# Patient Record
Sex: Female | Born: 1970
Health system: Southern US, Community
[De-identification: ages and names within clinical notes are randomized; demographics above are authoritative.]

## PROBLEM LIST (undated history)

## (undated) DIAGNOSIS — E119 Type 2 diabetes mellitus without complications: Secondary | ICD-10-CM

## (undated) DIAGNOSIS — I10 Essential (primary) hypertension: Secondary | ICD-10-CM

## (undated) DIAGNOSIS — Z8719 Personal history of other diseases of the digestive system: Secondary | ICD-10-CM

## (undated) DIAGNOSIS — D500 Iron deficiency anemia secondary to blood loss (chronic): Secondary | ICD-10-CM

## (undated) HISTORY — PX: CHOLECYSTECTOMY: SHX55

## (undated) HISTORY — PX: TUBAL LIGATION: SHX77

---

## 1998-01-08 ENCOUNTER — Emergency Department (HOSPITAL_COMMUNITY): Admission: EM | Admit: 1998-01-08 | Discharge: 1998-01-08 | Payer: Self-pay | Admitting: Emergency Medicine

## 1998-02-03 ENCOUNTER — Encounter: Payer: Self-pay | Admitting: Emergency Medicine

## 1998-02-03 ENCOUNTER — Emergency Department (HOSPITAL_COMMUNITY): Admission: EM | Admit: 1998-02-03 | Discharge: 1998-02-03 | Payer: Self-pay | Admitting: Emergency Medicine

## 1998-02-07 ENCOUNTER — Emergency Department (HOSPITAL_COMMUNITY): Admission: EM | Admit: 1998-02-07 | Discharge: 1998-02-08 | Payer: Self-pay | Admitting: Emergency Medicine

## 1998-02-08 ENCOUNTER — Encounter: Payer: Self-pay | Admitting: Emergency Medicine

## 1998-02-08 ENCOUNTER — Ambulatory Visit (HOSPITAL_COMMUNITY): Admission: RE | Admit: 1998-02-08 | Discharge: 1998-02-08 | Payer: Self-pay | Admitting: Emergency Medicine

## 1999-04-05 ENCOUNTER — Other Ambulatory Visit: Admission: RE | Admit: 1999-04-05 | Discharge: 1999-04-05 | Payer: Self-pay | Admitting: Internal Medicine

## 2002-04-12 ENCOUNTER — Encounter: Payer: Self-pay | Admitting: Emergency Medicine

## 2002-04-12 ENCOUNTER — Emergency Department (HOSPITAL_COMMUNITY): Admission: EM | Admit: 2002-04-12 | Discharge: 2002-04-12 | Payer: Self-pay | Admitting: Emergency Medicine

## 2002-04-26 ENCOUNTER — Encounter: Admission: RE | Admit: 2002-04-26 | Discharge: 2002-05-19 | Payer: Self-pay | Admitting: Orthopaedic Surgery

## 2002-08-03 ENCOUNTER — Emergency Department (HOSPITAL_COMMUNITY): Admission: EM | Admit: 2002-08-03 | Discharge: 2002-08-03 | Payer: Self-pay | Admitting: Emergency Medicine

## 2002-08-07 ENCOUNTER — Encounter: Payer: Self-pay | Admitting: Emergency Medicine

## 2002-08-07 ENCOUNTER — Emergency Department (HOSPITAL_COMMUNITY): Admission: EM | Admit: 2002-08-07 | Discharge: 2002-08-07 | Payer: Self-pay | Admitting: Emergency Medicine

## 2002-08-09 ENCOUNTER — Emergency Department (HOSPITAL_COMMUNITY): Admission: EM | Admit: 2002-08-09 | Discharge: 2002-08-09 | Payer: Self-pay | Admitting: Emergency Medicine

## 2003-01-20 ENCOUNTER — Emergency Department (HOSPITAL_COMMUNITY): Admission: EM | Admit: 2003-01-20 | Discharge: 2003-01-20 | Payer: Self-pay | Admitting: Emergency Medicine

## 2003-01-28 ENCOUNTER — Emergency Department (HOSPITAL_COMMUNITY): Admission: EM | Admit: 2003-01-28 | Discharge: 2003-01-28 | Payer: Self-pay | Admitting: Emergency Medicine

## 2004-08-09 ENCOUNTER — Emergency Department (HOSPITAL_COMMUNITY): Admission: EM | Admit: 2004-08-09 | Discharge: 2004-08-09 | Payer: Self-pay | Admitting: Emergency Medicine

## 2004-09-12 ENCOUNTER — Emergency Department (HOSPITAL_COMMUNITY): Admission: EM | Admit: 2004-09-12 | Discharge: 2004-09-12 | Payer: Self-pay | Admitting: Emergency Medicine

## 2004-12-29 ENCOUNTER — Emergency Department (HOSPITAL_COMMUNITY): Admission: EM | Admit: 2004-12-29 | Discharge: 2004-12-29 | Payer: Self-pay | Admitting: Emergency Medicine

## 2005-01-09 ENCOUNTER — Ambulatory Visit: Payer: Self-pay | Admitting: Family Medicine

## 2005-01-14 ENCOUNTER — Ambulatory Visit: Payer: Self-pay | Admitting: *Deleted

## 2005-03-06 ENCOUNTER — Ambulatory Visit: Payer: Self-pay | Admitting: Family Medicine

## 2005-06-12 ENCOUNTER — Ambulatory Visit: Payer: Self-pay | Admitting: Family Medicine

## 2005-06-26 ENCOUNTER — Ambulatory Visit: Payer: Self-pay | Admitting: Family Medicine

## 2005-07-06 ENCOUNTER — Emergency Department (HOSPITAL_COMMUNITY): Admission: EM | Admit: 2005-07-06 | Discharge: 2005-07-06 | Payer: Self-pay | Admitting: Emergency Medicine

## 2006-02-07 ENCOUNTER — Emergency Department (HOSPITAL_COMMUNITY): Admission: EM | Admit: 2006-02-07 | Discharge: 2006-02-07 | Payer: Self-pay | Admitting: Emergency Medicine

## 2006-02-09 ENCOUNTER — Emergency Department (HOSPITAL_COMMUNITY): Admission: EM | Admit: 2006-02-09 | Discharge: 2006-02-09 | Payer: Self-pay | Admitting: Emergency Medicine

## 2006-03-27 ENCOUNTER — Emergency Department (HOSPITAL_COMMUNITY): Admission: EM | Admit: 2006-03-27 | Discharge: 2006-03-27 | Payer: Self-pay | Admitting: Family Medicine

## 2006-08-03 ENCOUNTER — Emergency Department (HOSPITAL_COMMUNITY): Admission: EM | Admit: 2006-08-03 | Discharge: 2006-08-03 | Payer: Self-pay | Admitting: Emergency Medicine

## 2006-09-06 ENCOUNTER — Emergency Department (HOSPITAL_COMMUNITY): Admission: EM | Admit: 2006-09-06 | Discharge: 2006-09-06 | Payer: Self-pay | Admitting: Emergency Medicine

## 2006-09-07 ENCOUNTER — Emergency Department (HOSPITAL_COMMUNITY): Admission: EM | Admit: 2006-09-07 | Discharge: 2006-09-07 | Payer: Self-pay | Admitting: Emergency Medicine

## 2006-12-16 ENCOUNTER — Encounter (INDEPENDENT_AMBULATORY_CARE_PROVIDER_SITE_OTHER): Payer: Self-pay | Admitting: Family Medicine

## 2007-01-18 ENCOUNTER — Encounter (INDEPENDENT_AMBULATORY_CARE_PROVIDER_SITE_OTHER): Payer: Self-pay | Admitting: Emergency Medicine

## 2007-01-18 ENCOUNTER — Emergency Department (HOSPITAL_COMMUNITY): Admission: EM | Admit: 2007-01-18 | Discharge: 2007-01-18 | Payer: Self-pay | Admitting: Emergency Medicine

## 2007-07-18 ENCOUNTER — Emergency Department (HOSPITAL_COMMUNITY): Admission: EM | Admit: 2007-07-18 | Discharge: 2007-07-18 | Payer: Self-pay | Admitting: Family Medicine

## 2008-10-17 ENCOUNTER — Emergency Department (HOSPITAL_COMMUNITY): Admission: EM | Admit: 2008-10-17 | Discharge: 2008-10-17 | Payer: Self-pay | Admitting: Emergency Medicine

## 2009-11-01 ENCOUNTER — Emergency Department (HOSPITAL_COMMUNITY): Admission: EM | Admit: 2009-11-01 | Discharge: 2009-11-01 | Payer: Self-pay | Admitting: Emergency Medicine

## 2010-04-12 ENCOUNTER — Inpatient Hospital Stay (INDEPENDENT_AMBULATORY_CARE_PROVIDER_SITE_OTHER)
Admission: RE | Admit: 2010-04-12 | Discharge: 2010-04-12 | Disposition: A | Discharge status: Home / Self Care | Payer: Self-pay | Source: Ambulatory Visit | Attending: Family Medicine | Admitting: Family Medicine

## 2010-04-12 DIAGNOSIS — J019 Acute sinusitis, unspecified: Secondary | ICD-10-CM

## 2010-12-04 LAB — WET PREP, GENITAL
Clue Cells Wet Prep HPF POC: NONE SEEN
Trich, Wet Prep: NONE SEEN
Yeast Wet Prep HPF POC: NONE SEEN

## 2010-12-04 LAB — DIFFERENTIAL
Basophils Absolute: 0.2 — ABNORMAL HIGH
Eosinophils Absolute: 0.1 — ABNORMAL LOW
Lymphocytes Relative: 22
Lymphs Abs: 1.8
Monocytes Absolute: 0.7
Monocytes Relative: 8

## 2010-12-04 LAB — BASIC METABOLIC PANEL
CO2: 24
Creatinine, Ser: 0.84
GFR calc Af Amer: >60
Glucose, Bld: 144 — ABNORMAL HIGH
Potassium: 3.4 — ABNORMAL LOW
Sodium: 137

## 2010-12-04 LAB — URINALYSIS, ROUTINE W REFLEX MICROSCOPIC
Glucose, UA: NEGATIVE
Hgb urine dipstick: NEGATIVE
Leukocytes, UA: NEGATIVE
Nitrite: NEGATIVE
Protein, ur: 30 — AB
Specific Gravity, Urine: 1.035 — ABNORMAL HIGH

## 2010-12-04 LAB — URINE MICROSCOPIC-ADD ON

## 2010-12-04 LAB — CBC
Hemoglobin: 9.7 — ABNORMAL LOW
MCV: 58.4 — ABNORMAL LOW
Platelets: 469 — ABNORMAL HIGH
RBC: 5.46 — ABNORMAL HIGH
RDW: 21.5 — ABNORMAL HIGH
WBC: 8.2

## 2010-12-04 LAB — GC/CHLAMYDIA PROBE AMP, GENITAL
Chlamydia, DNA Probe: NEGATIVE
GC Probe Amp, Genital: NEGATIVE

## 2011-12-17 ENCOUNTER — Encounter (HOSPITAL_COMMUNITY): Payer: Self-pay | Admitting: Emergency Medicine

## 2011-12-17 ENCOUNTER — Emergency Department (INDEPENDENT_AMBULATORY_CARE_PROVIDER_SITE_OTHER)
Admission: EM | Admit: 2011-12-17 | Discharge: 2011-12-17 | Disposition: A | Discharge status: Home / Self Care | Payer: Self-pay | Source: Home / Self Care

## 2011-12-17 DIAGNOSIS — J019 Acute sinusitis, unspecified: Secondary | ICD-10-CM

## 2011-12-17 HISTORY — DX: Essential (primary) hypertension: I10

## 2011-12-17 MED ORDER — FLUTICASONE PROPIONATE 50 MCG/ACT NA SUSP: 2.0000 | Freq: Every day | NASAL | Status: DC

## 2011-12-17 NOTE — ED Provider Notes (Signed)
History     CSN: [id number]  Arrival date & time 12/17/11  1203   None     Chief Complaint  Patient presents with  . Otalgia    (Consider location/radiation/quality/duration/timing/severity/associated sxs/prior treatment) HPI Comments: 41 year old morbidly obese female developed URI symptoms approximately 48 hours ago. She states her ears hurt she has pressure in her ears and her bases bilaterally. She has PND but no fever, chills or sore throat. She is taking an OTC meds for sinuses. Denies shortness of breath or cough. She is tearful and pouting her lower lip.  Patient is a 41 y.o. female presenting with ear pain.  Otalgia Associated symptoms include rhinorrhea. Pertinent negatives include no neck pain and no rash.    Past Medical History  Diagnosis Date  . Hypertension     Past Surgical History  Procedure Date  . Cholecystectomy   . Cesarean section     No family history on file.  History  Substance Use Topics  . Smoking status: Never Smoker   . Smokeless tobacco: Not on file  . Alcohol Use: No    OB History    Grav Para Term Preterm Abortions TAB SAB Ect Mult Living                  Review of Systems  Constitutional: Negative for fever, chills, activity change, appetite change and fatigue.  HENT: Positive for ear pain, congestion, rhinorrhea, postnasal drip and sinus pressure. Negative for facial swelling, neck pain and neck stiffness.   Eyes: Negative.   Respiratory: Negative.   Cardiovascular: Negative.   Gastrointestinal: Negative.   Skin: Negative for pallor and rash.  Neurological: Negative.     Allergies  Review of patient's allergies indicates no known allergies.  Home Medications   Current Outpatient Rx  Name Route Sig Dispense Refill  . LISINOPRIL PO Oral Take by mouth.    Marland Kitchen OVER THE COUNTER MEDICATION  "dollar tree sinus medicine"    . FLUTICASONE PROPIONATE 50 MCG/ACT NA SUSP Nasal Place 2 sprays into the nose daily. 1 g 1    BP  129/78  Pulse 98  Temp 99 F (37.2 C) (Oral)  Resp 24  SpO2 99%  Physical Exam  Nursing note and vitals reviewed. Constitutional: She is oriented to person, place, and time. She appears well-developed and well-nourished.       Morbidly obese  HENT:  Right Ear: External ear normal.  Left Ear: External ear normal.  Mouth/Throat: Oropharynx is clear and moist. No oropharyngeal exudate.       The lightest touch over the paranasal sinuses and frontal  sinuses produces pain and withdrawal. The oropharynx is clear without erythema or exudates but there is small amount of clear PND. Bilateral TMs are normal. No erythema, retraction, or effusions.  Eyes: Conjunctivae normal and EOM are normal.  Neck: Normal range of motion. Neck supple.  Cardiovascular: Normal rate, regular rhythm and normal heart sounds.   Pulmonary/Chest: Effort normal and breath sounds normal. No respiratory distress. She has no wheezes.  Lymphadenopathy:    She has no cervical adenopathy.  Neurological: She is alert and oriented to person, place, and time. No cranial nerve deficit.  Skin: Skin is warm and dry.  Psychiatric: She has a normal mood and affect.    ED Course  Procedures (including critical care time)  Labs Reviewed - No data to display No results found.   1. Acute rhinosinusitis       MDM  Degrees  majority of these acute symptoms from rhinosinusitis are viral Sudafed 10 mg one by mouth every 4 hours when necessary congestion Normal saline nasal spray use frequently in both nostrils Fluticasone nasal spray as directed Neo-Synephrine nasal spray every 4 hours as needed for nasal congestion. Do not use more than 3 days. This is the short acting nasal spray         Hayden Rasmussen, NP 12/17/11 1527

## 2011-12-17 NOTE — ED Notes (Signed)
Facial pain and pressure, bilateral ear pain. Pain onset 2 days ag

## 2011-12-18 NOTE — ED Provider Notes (Signed)
Medical screening examination/treatment/procedure(s) were performed by resident physician or non-physician practitioner and as supervising physician I was immediately available for consultation/collaboration.   KINDL,JAMES DOUGLAS MD.    James D Kindl, MD 12/18/11 2102 

## 2013-06-23 ENCOUNTER — Other Ambulatory Visit: Payer: Self-pay | Admitting: Family Medicine

## 2013-06-23 ENCOUNTER — Other Ambulatory Visit (HOSPITAL_COMMUNITY)
Admission: RE | Admit: 2013-06-23 | Discharge: 2013-06-23 | Disposition: A | Discharge status: Home / Self Care | Payer: BC Managed Care – PPO | Source: Ambulatory Visit | Attending: Family Medicine | Admitting: Family Medicine

## 2013-06-23 DIAGNOSIS — Z124 Encounter for screening for malignant neoplasm of cervix: Secondary | ICD-10-CM | POA: Insufficient documentation

## 2013-06-23 DIAGNOSIS — Z1151 Encounter for screening for human papillomavirus (HPV): Secondary | ICD-10-CM | POA: Insufficient documentation

## 2013-07-08 ENCOUNTER — Other Ambulatory Visit: Payer: Self-pay | Admitting: Family Medicine

## 2013-07-08 DIAGNOSIS — N921 Excessive and frequent menstruation with irregular cycle: Secondary | ICD-10-CM

## 2013-07-13 ENCOUNTER — Emergency Department (HOSPITAL_COMMUNITY)
Admission: EM | Admit: 2013-07-13 | Discharge: 2013-07-13 | Disposition: A | Discharge status: Home / Self Care | Payer: BC Managed Care – PPO | Attending: Emergency Medicine | Admitting: Emergency Medicine

## 2013-07-13 ENCOUNTER — Encounter (HOSPITAL_COMMUNITY): Payer: Self-pay | Admitting: Emergency Medicine

## 2013-07-13 DIAGNOSIS — D649 Anemia, unspecified: Secondary | ICD-10-CM

## 2013-07-13 DIAGNOSIS — J302 Other seasonal allergic rhinitis: Secondary | ICD-10-CM

## 2013-07-13 DIAGNOSIS — R531 Weakness: Secondary | ICD-10-CM

## 2013-07-13 DIAGNOSIS — J309 Allergic rhinitis, unspecified: Secondary | ICD-10-CM | POA: Insufficient documentation

## 2013-07-13 DIAGNOSIS — R42 Dizziness and giddiness: Secondary | ICD-10-CM

## 2013-07-13 DIAGNOSIS — I10 Essential (primary) hypertension: Secondary | ICD-10-CM | POA: Insufficient documentation

## 2013-07-13 DIAGNOSIS — IMO0002 Reserved for concepts with insufficient information to code with codable children: Secondary | ICD-10-CM | POA: Insufficient documentation

## 2013-07-13 DIAGNOSIS — Z79899 Other long term (current) drug therapy: Secondary | ICD-10-CM | POA: Insufficient documentation

## 2013-07-13 LAB — POC URINE PREG, ED: Preg Test, Ur: NEGATIVE

## 2013-07-13 LAB — CBC WITH DIFFERENTIAL/PLATELET
BASOS PCT: 1 % (ref 0–1)
Basophils Absolute: 0.1 10*3/uL (ref 0.0–0.1)
EOS ABS: 0.2 10*3/uL (ref 0.0–0.7)
Eosinophils Relative: 2 % (ref 0–5)
HEMATOCRIT: 32.8 % — AB (ref 36.0–46.0)
HEMOGLOBIN: 9.1 g/dL — AB (ref 12.0–15.0)
LYMPHS ABS: 3 10*3/uL (ref 0.7–4.0)
LYMPHS PCT: 36 % (ref 12–46)
MCH: 16.9 pg — ABNORMAL LOW (ref 26.0–34.0)
MCHC: 27.7 g/dL — AB (ref 30.0–36.0)
MCV: 60.7 fL — AB (ref 78.0–100.0)
MONO ABS: 0.9 10*3/uL (ref 0.1–1.0)
Monocytes Relative: 11 % (ref 3–12)
NEUTROS ABS: 4.1 10*3/uL (ref 1.7–7.7)
Neutrophils Relative %: 50 % (ref 43–77)
PLATELETS: 434 10*3/uL — AB (ref 150–400)
RBC: 5.4 MIL/uL — ABNORMAL HIGH (ref 3.87–5.11)
RDW: 24.4 % — ABNORMAL HIGH (ref 11.5–15.5)
WBC: 8.3 10*3/uL (ref 4.0–10.5)

## 2013-07-13 LAB — COMPREHENSIVE METABOLIC PANEL
ALK PHOS: 110 U/L (ref 39–117)
ALT: 14 U/L (ref 0–35)
AST: 20 U/L (ref 0–37)
Albumin: 3.5 g/dL (ref 3.5–5.2)
BUN: 14 mg/dL (ref 6–23)
CALCIUM: 9.2 mg/dL (ref 8.4–10.5)
CO2: 24 meq/L (ref 19–32)
Chloride: 101 mEq/L (ref 96–112)
Creatinine, Ser: 0.73 mg/dL (ref 0.50–1.10)
GFR calc Af Amer: >90 mL/min (ref >90)
GLUCOSE: 100 mg/dL — AB (ref 70–99)
POTASSIUM: 4.6 meq/L (ref 3.7–5.3)
Sodium: 138 mEq/L (ref 137–147)
Total Bilirubin: <0.2 mg/dL — ABNORMAL LOW (ref 0.3–1.2)
Total Protein: 8.4 g/dL — ABNORMAL HIGH (ref 6.0–8.3)

## 2013-07-13 LAB — URINALYSIS, ROUTINE W REFLEX MICROSCOPIC
BILIRUBIN URINE: NEGATIVE
GLUCOSE, UA: NEGATIVE mg/dL
KETONES UR: NEGATIVE mg/dL
LEUKOCYTES UA: NEGATIVE
NITRITE: NEGATIVE
PROTEIN: NEGATIVE mg/dL
Specific Gravity, Urine: 1.013 (ref 1.005–1.030)
Urobilinogen, UA: 0.2 mg/dL (ref 0.0–1.0)
pH: 5.5 (ref 5.0–8.0)

## 2013-07-13 LAB — URINE MICROSCOPIC-ADD ON

## 2013-07-13 LAB — I-STAT TROPONIN, ED: TROPONIN I, POC: 0 ng/mL (ref 0.00–0.08)

## 2013-07-13 MED ORDER — MECLIZINE HCL 25 MG PO TABS: 25.0000 mg | ORAL_TABLET | Freq: Once | ORAL | Status: DC

## 2013-07-13 MED ORDER — SODIUM CHLORIDE 0.9 % IV BOLUS (SEPSIS)
1000.0000 mL | Freq: Once | INTRAVENOUS | Status: AC
  Administered 2013-07-13: 1000 mL via INTRAVENOUS

## 2013-07-13 MED ORDER — DIAZEPAM 5 MG PO TABS
5.0000 mg | ORAL_TABLET | Freq: Once | ORAL | Status: AC
  Administered 2013-07-13: 5 mg via ORAL
  Filled 2013-07-13: qty 1

## 2013-07-13 MED ORDER — FLUTICASONE PROPIONATE 50 MCG/ACT NA SUSP: 1.0000 | Freq: Every day | NASAL | Status: DC

## 2013-07-13 NOTE — Discharge Instructions (Signed)
Rest at home, drink plenty of fluids, eat well balanced diet. Make sure to continue your iron supplements. Please let your primary care doctor know tomorrow of your new symptoms and follow up with them in the office as soon as able. Also please follow up with OB/GYN regarding your heavy cycles. Return if worsening.     Dizziness Dizziness is a common problem. It is a feeling of unsteadiness or lightheadedness. You may feel like you are about to faint. Dizziness can lead to injury if you stumble or fall. A person of any age group can suffer from dizziness, but dizziness is more common in older adults. CAUSES  Dizziness can be caused by many different things, including:  Middle ear problems.  Standing for too long.  Infections.  An allergic reaction.  Aging.  An emotional response to something, such as the sight of blood.  Side effects of medicines.  Fatigue.  Problems with circulation or blood pressure.  Excess use of alcohol, medicines, or illegal drug use.  Breathing too fast (hyperventilation).  An arrhythmia or problems with your heart rhythm.  Low red blood cell count (anemia).  Pregnancy.  Vomiting, diarrhea, fever, or other illnesses that cause dehydration.  Diseases or conditions such as Parkinson's disease, high blood pressure (hypertension), diabetes, and thyroid problems.  Exposure to extreme heat. DIAGNOSIS  To find the cause of your dizziness, your caregiver may do a physical exam, lab tests, radiologic imaging scans, or an electrocardiography test (ECG).  TREATMENT  Treatment of dizziness depends on the cause of your symptoms and can vary greatly. HOME CARE INSTRUCTIONS   Drink enough fluids to keep your urine clear or pale yellow. This is especially important in very hot weather. In the elderly, it is also important in cold weather.  If your dizziness is caused by medicines, take them exactly as directed. When taking blood pressure medicines, it is  especially important to get up slowly.  Rise slowly from chairs and steady yourself until you feel okay.  In the morning, first sit up on the side of the bed. When this seems okay, stand slowly while holding onto something until you know your balance is fine.  If you need to stand in one place for a long time, be sure to move your legs often. Tighten and relax the muscles in your legs while standing.  If dizziness continues to be a problem, have someone stay with you for a day or two. Do this until you feel you are well enough to stay alone. Have the person call your caregiver if he or she notices changes in you that are concerning.  Do not drive or use heavy machinery if you feel dizzy.  Do not drink alcohol. SEEK IMMEDIATE MEDICAL CARE IF:   Your dizziness or lightheadedness gets worse.  You feel nauseous or vomit.  You develop problems with talking, walking, weakness, or using your arms, hands, or legs.  You are not thinking clearly or you have difficulty forming sentences. It may take a friend or family member to determine if your thinking is normal.  You develop chest pain, abdominal pain, shortness of breath, or sweating.  Your vision changes.  You notice any bleeding.  You have side effects from medicine that seems to be getting worse rather than better. MAKE SURE YOU:   Understand these instructions.  Will watch your condition.  Will get help right away if you are not doing well or get worse. Document Released: 08/07/2000 Document Revised: 05/06/2011  Document Reviewed: 08/31/2010 [facility] Patient Information 2014 [place], [place].

## 2013-07-13 NOTE — ED Provider Notes (Signed)
CSN: [id number]     Arrival date & time 07/13/13  1743 History   First MD Initiated Contact with Patient 07/13/13 1802     Chief Complaint  Patient presents with  . Dizziness     (Consider location/radiation/quality/duration/timing/severity/associated sxs/prior Treatment) HPI Cynthia Vazquez is a 43 y.o. female who presents to ED with complaint of dizziness and weakness. Patient states that she woke up feeling well this morning, states went to work. States at work and began feeling dizzy and lightheaded. States that she felt like she couldn't feed. States has generalized weakness as well. Patient is currently on her menstrual cycle. States she has very heavy periods and passing large clots. States that this is not unusual for her. She reports having a full physical by her primary care doctor 2 weeks ago was told that she's anemic. She was started on iron supplements which she is currently taking and states that they were going to set up her for iron transfusion. Patient states that she started having menstrual cycle 5 days ago and still heavy bleeding. She denies any abdominal pain. She does report headache. She denies any chest pain or shortness of breath. She denies any fever or chills. She denies abdominal pain, nausea, vomiting, diarrhea. She states eating and drinking well. No history of the same.    Past Medical History  Diagnosis Date  . Hypertension    Past Surgical History  Procedure Laterality Date  . Cholecystectomy    . Cesarean section     No family history on file. History  Substance Use Topics  . Smoking status: Never Smoker   . Smokeless tobacco: Not on file  . Alcohol Use: No   OB History   Grav Para Term Preterm Abortions TAB SAB Ect Mult Living                 Review of Systems  Constitutional: Positive for fatigue. Negative for fever and chills.  HENT: Positive for congestion.   Respiratory: Negative for cough, chest tightness and shortness of breath.    Cardiovascular: Negative for chest pain, palpitations and leg swelling.  Gastrointestinal: Negative for nausea, vomiting, abdominal pain and diarrhea.  Genitourinary: Positive for vaginal bleeding. Negative for dysuria, flank pain, vaginal discharge, vaginal pain and pelvic pain.  Musculoskeletal: Negative for arthralgias, myalgias, neck pain and neck stiffness.  Skin: Negative for rash.  Neurological: Positive for dizziness, weakness, light-headedness and headaches.  All other systems reviewed and are negative.     Allergies  Review of patient's allergies indicates no known allergies.  Home Medications   Prior to Admission medications   Medication Sig Start Date End Date Taking? Authorizing Provider  fluticasone (FLONASE) 50 MCG/ACT nasal spray Place 2 sprays into the nose daily. 12/17/11   Janne Napoleon, NP  LISINOPRIL PO Take by mouth.    Historical Provider, MD  OVER THE COUNTER MEDICATION "dollar tree sinus medicine"    Historical Provider, MD   BP 164/90  Pulse 85  Temp(Src) 99.7 F (37.6 C) (Oral)  Resp 18  SpO2 99%  LMP 07/08/2013 Physical Exam  Nursing note and vitals reviewed. Constitutional: She is oriented to person, place, and time. She appears well-developed and well-nourished. No distress.  HENT:  Head: Normocephalic and atraumatic.  Right Ear: Tympanic membrane, external ear and ear canal normal.  Left Ear: Tympanic membrane, external ear and ear canal normal.  Nose: Rhinorrhea present.  Mouth/Throat: Uvula is midline, oropharynx is clear and moist and mucous membranes are  normal.  Eyes: Conjunctivae and EOM are normal. Pupils are equal, round, and reactive to light.  Neck: Neck supple.  Cardiovascular: Normal rate, regular rhythm and normal heart sounds.   Pulmonary/Chest: Effort normal and breath sounds normal. No respiratory distress. She has no wheezes. She has no rales.  Abdominal: Soft. Bowel sounds are normal. She exhibits no distension. There is no  tenderness. There is no rebound.  Musculoskeletal: She exhibits no edema.  Neurological: She is alert and oriented to person, place, and time.  5/5 and equal upper and lower extremity strength bilaterally. Equal grip strength bilaterally. Normal finger to nose and heel to shin. No pronator drift.   Skin: Skin is warm and dry.  Psychiatric: She has a normal mood and affect. Her behavior is normal.    ED Course  Procedures (including critical care time) Labs Review Labs Reviewed  CBC WITH DIFFERENTIAL - Abnormal; Notable for the following:    RBC 5.40 (*)    Hemoglobin 9.1 (*)    HCT 32.8 (*)    MCV 60.7 (*)    MCH 16.9 (*)    MCHC 27.7 (*)    RDW 24.4 (*)    Platelets 434 (*)    All other components within normal limits  COMPREHENSIVE METABOLIC PANEL - Abnormal; Notable for the following:    Glucose, Bld 100 (*)    Total Protein 8.4 (*)    Total Bilirubin <0.2 (*)    All other components within normal limits  URINALYSIS, ROUTINE W REFLEX MICROSCOPIC - Abnormal; Notable for the following:    APPearance CLOUDY (*)    Hgb urine dipstick LARGE (*)    All other components within normal limits  URINE MICROSCOPIC-ADD ON - Abnormal; Notable for the following:    Bacteria, UA FEW (*)    All other components within normal limits  URINE CULTURE  I-STAT TROPOININ, ED  POC URINE PREG, ED    Imaging Review No results found.   EKG Interpretation   Date/Time:  Tuesday Jul 13 2013 18:10:23 EDT Ventricular Rate:  77 PR Interval:  143 QRS Duration: 92 QT Interval:  389 QTC Calculation: 440 R Axis:   46 Text Interpretation:  Sinus rhythm Normal ECG No significant change since  last tracing Confirmed by Ashok Cordia  MD, Lennette Bihari ([id number]) on 07/13/2013 8:12:05  PM      MDM   Final diagnoses:  Dizziness  Weakness  Anemia  Seasonal allergies    Pt with generalized weakness and dizziness onset today while at work. Pt appears very anxious, tearful. Normal neurological exam, pt states  she is unable to walk due to dizziness. Hypertensive, otherwise normal VS. Recent diagnosis of anemia, started on iron. Will get labs, urine analysis. Will try valium for possible vertigo and anxiety. IV fluids. Orthostatic vs.    9:53 PM Pt received 2L of IV fluids. She is now feeilng better. She had no improvement after valium. Pt admitted she became very anxious and scared when started feeling dizzy because she thought she was bleeding out, given she is on her period at this time. Pt admits to heavy periods. Discussed with her following up with GYN to discuss options to slow down her periods. Also to continue iron. Pt was able to ambulate in the hallway. Discussed pt with Dr. Ashok Cordia who agrees with assessment and plan to d/c her home and continue outpatient follow up. Pt agrees to the plan as well.    Renold Genta, PA-C 07/13/13 2200  Lahoma Rocker  A Wane Mollett, PA-C 07/13/13 2201

## 2013-07-13 NOTE — ED Notes (Signed)
Pt denies pain,  Just weakness,  Pt taken to bathroom minimal assistance

## 2013-07-13 NOTE — ED Notes (Signed)
Pt c/o lightheadedness off and on for about 1 week has gotten worse today. Pt found out she was anemic about 2 weeks ago. Pt also states she feels weak, is on period as well.

## 2013-07-14 ENCOUNTER — Telehealth: Payer: Self-pay | Admitting: *Deleted

## 2013-07-14 ENCOUNTER — Ambulatory Visit (HOSPITAL_BASED_OUTPATIENT_CLINIC_OR_DEPARTMENT_OTHER): Payer: BC Managed Care – PPO | Admitting: Hematology and Oncology

## 2013-07-14 ENCOUNTER — Encounter: Payer: Self-pay | Admitting: Hematology and Oncology

## 2013-07-14 ENCOUNTER — Telehealth: Payer: Self-pay | Admitting: Hematology and Oncology

## 2013-07-14 ENCOUNTER — Ambulatory Visit: Payer: BC Managed Care – PPO

## 2013-07-14 VITALS — BP 151/88 | HR 88 | Temp 98.7°F | Resp 20 | Ht 68.0 in | Wt 352.8 lb

## 2013-07-14 DIAGNOSIS — D75839 Thrombocytosis, unspecified: Secondary | ICD-10-CM | POA: Insufficient documentation

## 2013-07-14 DIAGNOSIS — D509 Iron deficiency anemia, unspecified: Secondary | ICD-10-CM

## 2013-07-14 DIAGNOSIS — D500 Iron deficiency anemia secondary to blood loss (chronic): Secondary | ICD-10-CM

## 2013-07-14 DIAGNOSIS — N920 Excessive and frequent menstruation with regular cycle: Secondary | ICD-10-CM

## 2013-07-14 DIAGNOSIS — D473 Essential (hemorrhagic) thrombocythemia: Secondary | ICD-10-CM

## 2013-07-14 LAB — URINE CULTURE
CULTURE: NO GROWTH
Colony Count: NO GROWTH

## 2013-07-14 NOTE — Telephone Encounter (Signed)
Gave pt appt for lab and MD , left Sharyn Lull a VM regarding appt for tomorrow IV iron

## 2013-07-14 NOTE — Progress Notes (Signed)
[facility] CONSULT NOTE  Patient Care Team: [name], MD as PCP - General (Family Medicine)  CHIEF COMPLAINTS/PURPOSE OF CONSULTATION:  Severe iron deficiency anemia  HISTORY OF PRESENTING ILLNESS:  Cynthia Vazquez 43 y.o. female is here because of severe iron deficiency anemia and inability to tolerate oral iron supplements.  She was found to have abnormal CBC from routine blood work. In November 2008, she has anemia with hemoglobin of 9.7, MCV of 58.4 with platelet count elevation at 469. On 07/13/2013, she is still anemic with hemoglobin 9.1, MCV of 60.7 and elevated platelet count of 434. Iron studies performed in her physician's office was very low. She denies recent chest pain on exertion, shortness of breath on minimal exertion, or palpitations. She did complain of profound weakness, dizziness as well as new onset of leg cramps. She had not noticed any recent bleeding such as epistaxis, hematuria or hematochezia. She does complain of excessive menstruation. She has periods 7-10 days every month with significant heavy periods in the first few days of each menstrual cycle. The patient denies over the counter NSAID ingestion. She is not on antiplatelets agents. She had been pregnant twice with normal delivery. She was never told she was anemic during pregnancies. She had no prior history or diagnosis of cancer. Her age appropriate screening programs are up-to-date. She complains of excessive pica with chewing of ice. She eats a variety of diet. She never donated blood or received blood transfusion The patient was prescribed oral iron supplements and she takes a few different formula but each type of iron preparation causes significant nausea and constipation.  MEDICAL HISTORY:  Past Medical History  Diagnosis Date  . Hypertension     SURGICAL HISTORY: Past Surgical History  Procedure Laterality Date  . Cholecystectomy    . Cesarean section      SOCIAL  HISTORY: History   Social History  . Marital Status: Married    Spouse Name: N/A    Number of Children: N/A  . Years of Education: N/A   Occupational History  . Not on file.   Social History Main Topics  . Smoking status: Never Smoker   . Smokeless tobacco: Never Used  . Alcohol Use: No  . Drug Use: Not on file  . Sexual Activity: Not on file   Other Topics Concern  . Not on file   Social History Narrative  . No narrative on file    FAMILY HISTORY: Family History  Problem Relation Age of Onset  . Anemia Mother   . Anemia Paternal Grandfather     ALLERGIES:  is allergic to augmentin.  MEDICATIONS:  Current Outpatient Prescriptions  Medication Sig Dispense Refill  . cyclobenzaprine (FLEXERIL) 10 MG tablet Take 10 mg by mouth 3 (three) times daily as needed for muscle spasms.      . ferrous sulfate 325 (65 FE) MG tablet Take 325 mg by mouth daily with breakfast.      . fluticasone (FLONASE) 50 MCG/ACT nasal spray Place 1 spray into both nostrils daily.  16 g  2  . lisinopril-hydrochlorothiazide (PRINZIDE,ZESTORETIC) 20-12.5 MG per tablet Take 1 tablet by mouth daily.      Marland Kitchen loratadine (CLARITIN) 10 MG tablet Take 10 mg by mouth daily.       No current facility-administered medications for this visit.    REVIEW OF SYSTEMS:   Constitutional: Denies fevers, chills or abnormal night sweats Eyes: Denies blurriness of vision, double vision or watery eyes  Ears, nose, mouth, throat, and face: Denies mucositis or sore throat Respiratory: Denies cough, dyspnea or wheezes Cardiovascular: Denies palpitation, chest discomfort or lower extremity swelling Gastrointestinal:  Denies nausea, heartburn or change in bowel habits Skin: Denies abnormal skin rashes Lymphatics: Denies new lymphadenopathy or easy bruising Neurological:Denies numbness, tingling or new weaknesses Behavioral/Psych: Mood is stable, no new changes  All other systems were reviewed with the patient and are  negative.  PHYSICAL EXAMINATION: ECOG PERFORMANCE STATUS: 1 - Symptomatic but completely ambulatory  Filed Vitals:   07/14/13 1052  BP: 151/88  Pulse: 88  Temp: 98.7 F (37.1 C)  Resp: 20   Filed Weights   07/14/13 1052  Weight: 352 lb 12.8 oz (160.029 kg)    GENERAL:alert, no distress and comfortable. She is morbidly obese SKIN: skin color, texture, turgor are normal, no rashes or significant lesions EYES: normal, conjunctiva are pale and non-injected, sclera clear OROPHARYNX:no exudate, no erythema and lips, buccal mucosa, and tongue normal  NECK: supple, thyroid normal size, non-tender, without nodularity LYMPH:  no palpable lymphadenopathy in the cervical, axillary or inguinal LUNGS: clear to auscultation and percussion with normal breathing effort HEART: regular rate & rhythm and no murmurs and no lower extremity edema ABDOMEN:abdomen soft, non-tender and normal bowel sounds Musculoskeletal:no cyanosis of digits and no clubbing  PSYCH: alert & oriented x 3 with fluent speech NEURO: no focal motor/sensory deficits  LABORATORY DATA:  I have reviewed the data as listed Recent Results (from the past 2160 hour(s))  CBC WITH DIFFERENTIAL     Status: Abnormal   Collection Time    07/13/13  6:22 PM      Result Value Ref Range   WBC 8.3  4.0 - 10.5 K/uL   RBC 5.40 (*) 3.87 - 5.11 MIL/uL   Hemoglobin 9.1 (*) 12.0 - 15.0 g/dL   HCT 32.8 (*) 36.0 - 46.0 %   MCV 60.7 (*) 78.0 - 100.0 fL   MCH 16.9 (*) 26.0 - 34.0 pg   MCHC 27.7 (*) 30.0 - 36.0 g/dL   RDW 24.4 (*) 11.5 - 15.5 %   Platelets 434 (*) 150 - 400 K/uL   Neutrophils Relative % 50  43 - 77 %   Lymphocytes Relative 36  12 - 46 %   Monocytes Relative 11  3 - 12 %   Eosinophils Relative 2  0 - 5 %   Basophils Relative 1  0 - 1 %   Neutro Abs 4.1  1.7 - 7.7 K/uL   Lymphs Abs 3.0  0.7 - 4.0 K/uL   Monocytes Absolute 0.9  0.1 - 1.0 K/uL   Eosinophils Absolute 0.2  0.0 - 0.7 K/uL   Basophils Absolute 0.1  0.0 - 0.1  K/uL   RBC Morphology POLYCHROMASIA PRESENT     Comment: TARGET CELLS     SICKLE CELLS   WBC Morphology VACUOLATED NEUTROPHILS     Smear Review LARGE PLATELETS PRESENT    COMPREHENSIVE METABOLIC PANEL     Status: Abnormal   Collection Time    07/13/13  6:22 PM      Result Value Ref Range   Sodium 138  137 - 147 mEq/L   Potassium 4.6  3.7 - 5.3 mEq/L   Chloride 101  96 - 112 mEq/L   CO2 24  19 - 32 mEq/L   Glucose, Bld 100 (*) 70 - 99 mg/dL   BUN 14  6 - 23 mg/dL   Creatinine, Ser 0.73  0.50 -  1.10 mg/dL   Calcium 9.2  8.4 - 10.5 mg/dL   Total Protein 8.4 (*) 6.0 - 8.3 g/dL   Albumin 3.5  3.5 - 5.2 g/dL   AST 20  0 - 37 U/L   ALT 14  0 - 35 U/L   Alkaline Phosphatase 110  39 - 117 U/L   Total Bilirubin <0.2 (*) 0.3 - 1.2 mg/dL   GFR calc non Af Amer >90  >90 mL/min   GFR calc Af Amer >90  >90 mL/min   Comment: (NOTE)     The eGFR has been calculated using the CKD EPI equation.     This calculation has not been validated in all clinical situations.     eGFR's persistently <90 mL/min signify possible Chronic Kidney     Disease.  Randolm Idol, ED     Status: None   Collection Time    07/13/13  6:29 PM      Result Value Ref Range   Troponin i, poc 0.00  0.00 - 0.08 ng/mL   Comment 3            Comment: Due to the release kinetics of cTnI,     a negative result within the first hours     of the onset of symptoms does not rule out     myocardial infarction with certainty.     If myocardial infarction is still suspected,     repeat the test at appropriate intervals.  URINALYSIS, ROUTINE W REFLEX MICROSCOPIC     Status: Abnormal   Collection Time    07/13/13  6:33 PM      Result Value Ref Range   Color, Urine YELLOW  YELLOW   APPearance CLOUDY (*) CLEAR   Specific Gravity, Urine 1.013  1.005 - 1.030   pH 5.5  5.0 - 8.0   Glucose, UA NEGATIVE  NEGATIVE mg/dL   Hgb urine dipstick LARGE (*) NEGATIVE   Bilirubin Urine NEGATIVE  NEGATIVE   Ketones, ur NEGATIVE  NEGATIVE  mg/dL   Protein, ur NEGATIVE  NEGATIVE mg/dL   Urobilinogen, UA 0.2  0.0 - 1.0 mg/dL   Nitrite NEGATIVE  NEGATIVE   Leukocytes, UA NEGATIVE  NEGATIVE  URINE MICROSCOPIC-ADD ON     Status: Abnormal   Collection Time    07/13/13  6:33 PM      Result Value Ref Range   Squamous Epithelial / LPF RARE  RARE   WBC, UA 0-2  <3 WBC/hpf   RBC / HPF TOO NUMEROUS TO COUNT  <3 RBC/hpf   Bacteria, UA FEW (*) RARE  POC URINE PREG, ED     Status: None   Collection Time    07/13/13  6:48 PM      Result Value Ref Range   Preg Test, Ur NEGATIVE  NEGATIVE   Comment:            THE SENSITIVITY OF THIS     METHODOLOGY IS >24 mIU/mL   ASSESSMENT & PLAN:  #1 severe microcytic anemia #2 severe Iron deficiency  #3 thrombocytosis, likely due to iron deficiency The most likely cause of her anemia is due to chronic blood loss from heavy menstruation. We discussed some of the risks, benefits, and alternatives of intravenous iron infusions. The patient is symptomatic from anemia and the iron level is critically low. She tolerated oral iron supplement poorly and desires to achieved higher levels of iron faster for adequate hematopoesis. Some of the side-effects to be expected  including risks of infusion reactions, phlebitis, headaches, nausea and fatigue.  The patient is willing to proceed. Patient education material was dispensed.  Goal is to keep ferritin level greater than 50 I plan to bring her back the week before her return appointment in one month to ensure adequate replacement. If she has persistent low MCV, I will proceed with hemoglobinopathy evaluation to exclude thalassemia. Recommend she takes prenatal vitamin for folic acid another mineral supplementation. Her most recent serum folate and vitamin B12 level were adequate. #4 heavy menstruation The patient is undergoing evaluation by a gynecologist to exclude uterine fibroids. All questions were answered. The patient knows to call the clinic with any  problems, questions or concerns. I spent 40 minutes counseling the patient face to face. The total time spent in the appointment was 55 minutes and more than 50% was on counseling.     Heath Lark, MD 07/14/2013 4:26 PM

## 2013-07-14 NOTE — Telephone Encounter (Signed)
Per staff message and POF I have scheduled appts.  JMW  

## 2013-07-14 NOTE — Progress Notes (Signed)
Checked in new patient with no financial issues. She has not seen the dr net and she has her appt card.

## 2013-07-15 ENCOUNTER — Ambulatory Visit
Admission: RE | Admit: 2013-07-15 | Discharge: 2013-07-15 | Disposition: A | Discharge status: Home / Self Care | Payer: BC Managed Care – PPO | Source: Ambulatory Visit | Attending: Family Medicine | Admitting: Family Medicine

## 2013-07-15 ENCOUNTER — Ambulatory Visit (HOSPITAL_BASED_OUTPATIENT_CLINIC_OR_DEPARTMENT_OTHER): Payer: BC Managed Care – PPO

## 2013-07-15 VITALS — BP 140/85 | HR 75 | Temp 98.5°F | Resp 19

## 2013-07-15 DIAGNOSIS — N921 Excessive and frequent menstruation with irregular cycle: Secondary | ICD-10-CM

## 2013-07-15 DIAGNOSIS — D509 Iron deficiency anemia, unspecified: Secondary | ICD-10-CM

## 2013-07-15 DIAGNOSIS — D500 Iron deficiency anemia secondary to blood loss (chronic): Secondary | ICD-10-CM

## 2013-07-15 MED ORDER — SODIUM CHLORIDE 0.9 % IV SOLN
1020.0000 mg | Freq: Once | INTRAVENOUS | Status: AC
  Administered 2013-07-15: 1020 mg via INTRAVENOUS
  Filled 2013-07-15: qty 34

## 2013-07-15 MED ORDER — SODIUM CHLORIDE 0.9 % IV SOLN
Freq: Once | INTRAVENOUS | Status: AC
  Administered 2013-07-15: 13:00:00 via INTRAVENOUS

## 2013-07-15 NOTE — ED Provider Notes (Signed)
Medical screening examination/treatment/procedure(s) were conducted as a shared visit with non-physician practitioner(s) and myself.  I personally evaluated the patient during the encounter.   EKG Interpretation   Date/Time:  Tuesday Jul 13 2013 18:10:23 EDT Ventricular Rate:  77 PR Interval:  143 QRS Duration: 92 QT Interval:  389 QTC Calculation: 440 R Axis:   46 Text Interpretation:  Sinus rhythm Normal ECG No significant change since  last tracing Confirmed by Adelheid Hoggard  MD, Lennette Bihari ([id number]) on 07/13/2013 8:12:05  PM      Pt states hx anemia, heavy periods/?fibroids, c/o feeling lightheaded today, esp when stands. No room spinning or balance problems. No unilateral numbness/weakness. No change in speech or vision. No cp or sob. No palpitations. States just started fe therapy. Chest cta. Rrr. abd soft nt.  Denies current vaginal bleeding, no rectal bleeding or melena. No fever or chills. Labs. Ivf.   Mirna Mires, MD 07/15/13 1120

## 2013-07-15 NOTE — Patient Instructions (Addendum)
Ferumoxytol injection - Ferahem What is this medicine? FERUMOXYTOL is an iron complex. Iron is used to make healthy red blood cells, which carry oxygen and nutrients throughout the body. This medicine is used to treat iron deficiency anemia in people with chronic kidney disease. This medicine may be used for other purposes; ask your health care provider or pharmacist if you have questions. COMMON BRAND NAME(S): Feraheme  What should I tell my health care provider before I take this medicine? They need to know if you have any of these conditions: -anemia not caused by low iron levels -high levels of iron in the blood -magnetic resonance imaging (MRI) test scheduled -an unusual or allergic reaction to iron, other medicines, foods, dyes, or preservatives -pregnant or trying to get pregnant -breast-feeding How should I use this medicine? This medicine is for injection into a vein. It is given by a health care professional in a hospital or clinic setting. Talk to your pediatrician regarding the use of this medicine in children. Special care may be needed. Overdosage: If you think you've taken too much of this medicine contact a poison control center or emergency room at once. Overdosage: If you think you have taken too much of this medicine contact a poison control center or emergency room at once. NOTE: This medicine is only for you. Do not share this medicine with others. What if I miss a dose? It is important not to miss your dose. Call your doctor or health care professional if you are unable to keep an appointment. What may interact with this medicine? This medicine may interact with the following medications: -other iron products This list may not describe all possible interactions. Give your health care provider a list of all the medicines, herbs, non-prescription drugs, or dietary supplements you use. Also tell them if you smoke, drink alcohol, or use illegal drugs. Some items may interact  with your medicine. What should I watch for while using this medicine? Visit your doctor or healthcare professional regularly. Tell your doctor or healthcare professional if your symptoms do not start to get better or if they get worse. You may need blood work done while you are taking this medicine. You may need to follow a special diet. Talk to your doctor. Foods that contain iron include: whole grains/cereals, dried fruits, beans, or peas, leafy green vegetables, and organ meats (liver, kidney). What side effects may I notice from receiving this medicine? Side effects that you should report to your doctor or health care professional as soon as possible: -allergic reactions like skin rash, itching or hives, swelling of the face, lips, or tongue -breathing problems -changes in blood pressure -feeling faint or lightheaded, falls -fever or chills -flushing, sweating, or hot feelings -swelling of the ankles or feet Side effects that usually do not require medical attention (Report these to your doctor or health care professional if they continue or are bothersome.): -diarrhea -headache -nausea, vomiting -stomach pain This list may not describe all possible side effects. Call your doctor for medical advice about side effects. You may report side effects to FDA at 1-800-FDA-1088. Where should I keep my medicine? This drug is given in a hospital or clinic and will not be stored at home. NOTE: This sheet is a summary. It may not cover all possible information. If you have questions about this medicine, talk to your doctor, pharmacist, or health care provider.  2014, [employer]vier/[employer] Standard. (2011-09-27 15:23:36)

## 2013-07-15 NOTE — Progress Notes (Signed)
Patient completed first time feraheme infusion without any problems. Patient observed for 30 minutes post transfusion. VS taken and charted. Patient discharged home with AVS. Cindi Carbon, RN

## 2013-07-16 ENCOUNTER — Ambulatory Visit: Payer: BC Managed Care – PPO

## 2013-08-07 ENCOUNTER — Encounter (HOSPITAL_COMMUNITY): Payer: Self-pay | Admitting: Emergency Medicine

## 2013-08-07 ENCOUNTER — Emergency Department (INDEPENDENT_AMBULATORY_CARE_PROVIDER_SITE_OTHER)
Admission: EM | Admit: 2013-08-07 | Discharge: 2013-08-07 | Disposition: A | Discharge status: Home / Self Care | Payer: BC Managed Care – PPO | Source: Home / Self Care | Attending: Emergency Medicine | Admitting: Emergency Medicine

## 2013-08-07 DIAGNOSIS — J019 Acute sinusitis, unspecified: Secondary | ICD-10-CM

## 2013-08-07 MED ORDER — PREDNISONE 10 MG PO TABS: ORAL_TABLET | ORAL | Status: DC

## 2013-08-07 MED ORDER — CEFDINIR 300 MG PO CAPS: 300.0000 mg | ORAL_CAPSULE | Freq: Two times a day (BID) | ORAL | Status: DC

## 2013-08-07 MED ORDER — CEFTRIAXONE SODIUM 1 G IJ SOLR
1.0000 g | Freq: Once | INTRAMUSCULAR | Status: AC
  Administered 2013-08-07: 1 g via INTRAMUSCULAR

## 2013-08-07 MED ORDER — METHYLPREDNISOLONE ACETATE 80 MG/ML IJ SUSP
INTRAMUSCULAR | Status: AC
  Filled 2013-08-07: qty 1

## 2013-08-07 MED ORDER — LIDOCAINE HCL (PF) 1 % IJ SOLN
INTRAMUSCULAR | Status: AC
  Filled 2013-08-07: qty 5

## 2013-08-07 MED ORDER — HYDROCODONE-ACETAMINOPHEN 5-325 MG PO TABS: ORAL_TABLET | ORAL | Status: DC

## 2013-08-07 MED ORDER — METHYLPREDNISOLONE ACETATE 80 MG/ML IJ SUSP
80.0000 mg | Freq: Once | INTRAMUSCULAR | Status: AC
  Administered 2013-08-07: 80 mg via INTRAMUSCULAR

## 2013-08-07 MED ORDER — CEFTRIAXONE SODIUM 1 G IJ SOLR
INTRAMUSCULAR | Status: AC
  Filled 2013-08-07: qty 10

## 2013-08-07 NOTE — ED Notes (Signed)
Patient states started with cold like symptoms about a week ago Complains of running low grade temps, headache to the front of her head Sinus pressure under her eyes and congestion

## 2013-08-07 NOTE — Discharge Instructions (Signed)

## 2013-08-07 NOTE — ED Provider Notes (Signed)
  Chief Complaint   Chief Complaint  Patient presents with  . Headache    History of Present Illness   Cynthia Vazquez is a 43 year old female who has had a one-week history of nasal congestion with yellow drainage, headache and sinus pressure, low-grade fever of 100.1, post nasal drip which is yellow in color, swelling of the face, pressure in the ears, cough, and dizziness.  Review of Systems   Other than as noted above, the patient denies any of the following symptoms: Systemic:  No fevers, chills, sweats, or myalgias. Eye:  No redness or discharge. ENT:  No ear pain, headache, nasal congestion, drainage, sinus pressure, or sore throat. Neck:  No neck pain, stiffness, or swollen glands. Lungs:  No cough, sputum production, hemoptysis, wheezing, chest tightness, shortness of breath or chest pain. GI:  No abdominal pain, nausea, vomiting or diarrhea.  [place]   Past medical history, family history, social history, meds, and allergies were reviewed. She is intolerant to Augmentin. She has high blood pressure and takes lisinopril.  Physical exam   Vital signs:  BP 144/106  Pulse 100  Temp(Src) 99.1 F (37.3 C) (Oral)  Resp 25  SpO2 95%  LMP 07/08/2013 General:  Alert and oriented.  In no distress.  Skin warm and dry. Eye:  No conjunctival injection or drainage. Lids were normal. ENT:  TMs and canals were normal, without erythema or inflammation.  Nasal mucosa was clear and uncongested, without drainage.  Mucous membranes were moist.  Pharynx was clear with no exudate or drainage.  There were no oral ulcerations or lesions. There is pain to palpation of the maxillary and frontal sinuses. No swelling or redness. Neck:  Supple, no adenopathy, tenderness or mass. Lungs:  No respiratory distress.  Lungs were clear to auscultation, without wheezes, rales or rhonchi.  Breath sounds were clear and equal bilaterally.  Heart:  Regular rhythm, without gallops, murmers or rubs. Skin:  Clear,  warm, and dry, without rash or lesions.   Course in [facility]   Given Depo-Medrol 80 mg IM and Rocephin 1 g IM.  Assessment     The encounter diagnosis was Acute sinusitis.  Plan    1.  Meds:  The following meds were prescribed:   Discharge Medication List as of 08/07/2013  7:30 PM    START taking these medications   Details  cefdinir (OMNICEF) 300 MG capsule Take 1 capsule (300 mg total) by mouth 2 (two) times daily., Starting 08/07/2013, Until Discontinued, Normal    HYDROcodone-acetaminophen (NORCO/VICODIN) 5-325 MG per tablet 1 to 2 tabs every 4 to 6 hours as needed for pain., Print    predniSONE (DELTASONE) 10 MG tablet Take 4 tabs daily for 4 days, 3 tabs daily for 4 days, 2 tabs daily for 4 days, then 1 tab daily for 4 days., Normal        2.  Patient Education/Counseling:  The patient was given appropriate handouts, self care instructions, and instructed in symptomatic relief.  Instructed to get extra fluids, rest, and use a cool mist vaporizer.    3.  Follow up:  The patient was told to follow up here if no better in 3 to 4 days, or sooner if becoming worse in any way, and given some red flag symptoms such as increasing fever, difficulty breathing, chest pain, or persistent vomiting which would prompt immediate return.  Follow up here as needed.       Harden Mo, MD 08/07/13 2026

## 2013-08-12 ENCOUNTER — Other Ambulatory Visit (HOSPITAL_BASED_OUTPATIENT_CLINIC_OR_DEPARTMENT_OTHER): Payer: BC Managed Care – PPO

## 2013-08-12 DIAGNOSIS — D500 Iron deficiency anemia secondary to blood loss (chronic): Secondary | ICD-10-CM

## 2013-08-12 DIAGNOSIS — D509 Iron deficiency anemia, unspecified: Secondary | ICD-10-CM

## 2013-08-12 LAB — CBC & DIFF AND RETIC
BASO%: 1 % (ref 0.0–2.0)
BASOS ABS: 0.1 10*3/uL (ref 0.0–0.1)
EOS%: 0.3 % (ref 0.0–7.0)
Eosinophils Absolute: 0 10*3/uL (ref 0.0–0.5)
HCT: 38.7 % (ref 34.8–46.6)
HEMOGLOBIN: 11.8 g/dL (ref 11.6–15.9)
IMMATURE RETIC FRACT: 10 % (ref 1.60–10.00)
LYMPH%: 41.9 % (ref 14.0–49.7)
MCH: 20.1 pg — ABNORMAL LOW (ref 25.1–34.0)
MCHC: 30.4 g/dL — ABNORMAL LOW (ref 31.5–36.0)
MCV: 66.1 fL — AB (ref 79.5–101.0)
MONO#: 1 10*3/uL — ABNORMAL HIGH (ref 0.1–0.9)
MONO%: 8.1 % (ref 0.0–14.0)
NEUT%: 48.7 % (ref 38.4–76.8)
NEUTROS ABS: 5.8 10*3/uL (ref 1.5–6.5)
Platelets: 335 10*3/uL (ref 145–400)
RBC: 5.86 10*6/uL — ABNORMAL HIGH (ref 3.70–5.45)
RDW: 32 % — ABNORMAL HIGH (ref 11.2–14.5)
RETIC %: 0.75 % (ref 0.70–2.10)
RETIC CT ABS: 43.95 10*3/uL (ref 33.70–90.70)
WBC: 11.9 10*3/uL — AB (ref 3.9–10.3)
lymph#: 5 10*3/uL — ABNORMAL HIGH (ref 0.9–3.3)

## 2013-08-12 LAB — FERRITIN CHCC: FERRITIN: 84 ng/mL (ref 9–269)

## 2013-08-16 LAB — HEMOGLOBINOPATHY EVALUATION
HGB A2 QUANT: 2 % — AB (ref 2.2–3.2)
HGB A: 98 % — AB (ref 96.8–97.8)
Hemoglobin Other: 0 %
Hgb F Quant: 0 % (ref 0.0–2.0)
Hgb S Quant: 0 %

## 2013-08-19 ENCOUNTER — Ambulatory Visit (HOSPITAL_BASED_OUTPATIENT_CLINIC_OR_DEPARTMENT_OTHER): Payer: BC Managed Care – PPO | Admitting: Hematology and Oncology

## 2013-08-19 ENCOUNTER — Encounter: Payer: Self-pay | Admitting: Hematology and Oncology

## 2013-08-19 VITALS — BP 126/81 | HR 82 | Temp 98.1°F | Resp 20 | Ht 68.0 in | Wt 356.1 lb

## 2013-08-19 DIAGNOSIS — D75839 Thrombocytosis, unspecified: Secondary | ICD-10-CM

## 2013-08-19 DIAGNOSIS — D473 Essential (hemorrhagic) thrombocythemia: Secondary | ICD-10-CM

## 2013-08-19 DIAGNOSIS — D563 Thalassemia minor: Secondary | ICD-10-CM

## 2013-08-19 DIAGNOSIS — N921 Excessive and frequent menstruation with irregular cycle: Secondary | ICD-10-CM

## 2013-08-19 DIAGNOSIS — N920 Excessive and frequent menstruation with regular cycle: Secondary | ICD-10-CM

## 2013-08-19 DIAGNOSIS — D500 Iron deficiency anemia secondary to blood loss (chronic): Secondary | ICD-10-CM

## 2013-08-19 NOTE — Assessment & Plan Note (Signed)
This has resolved with iron replacement.

## 2013-08-19 NOTE — Assessment & Plan Note (Signed)
I suspect she has thalassemia. Even though hemoglobinopathy evaluation did not detected major abnormalities, based on her microcytic appearance with elevated RBC, the patient likely had thalassemia. I discussed with her the genetic basis of thalassemia and would not recommend screening her children for now.

## 2013-08-19 NOTE — Assessment & Plan Note (Signed)
This is improving nicely with intravenous iron. I recommend she contact her primary care Cynthia Vazquez about treatment for menorrhagia. I recommend she has CBC and ferritin redrawn in 6 months and have the results faxed to me. If she has recurrence of anemia or iron deficiency again in the future, I will be happy to see her back.

## 2013-08-19 NOTE — Progress Notes (Signed)
[place] OFFICE PROGRESS NOTE  Cynthia Coma, MD  SUMMARY OF HEMATOLOGIC HISTORY:  She was found to have abnormal CBC from routine blood work. In November 2008, she has anemia with hemoglobin of 9.7, MCV of 58.4 with platelet count elevation at 469. On 07/13/2013, she is still anemic with hemoglobin 9.1, MCV of 60.7 and elevated platelet count of 434. Iron studies performed in her physician's office was very low. She denies recent chest pain on exertion, shortness of breath on minimal exertion, or palpitations. She did complain of profound weakness, dizziness as well as new onset of leg cramps. She had not noticed any recent bleeding such as epistaxis, hematuria or hematochezia. She does complain of excessive menstruation. She has periods 7-10 days every month with significant heavy periods in the first few days of each menstrual cycle. On 07/15/2013, she received one dose of intravenous iron. INTERVAL HISTORY: Cynthia Vazquez 43 y.o. female returns for  further followup. She felt improved since iron infusion. She has not started taking medications for menorrhagia.  I have reviewed the past medical history, past surgical history, social history and family history with the patient and they are unchanged from previous note.  ALLERGIES:  is allergic to augmentin.  MEDICATIONS:  Current Outpatient Prescriptions  Medication Sig Dispense Refill  . fluticasone (FLONASE) 50 MCG/ACT nasal spray Place 1 spray into both nostrils daily.  16 g  2  . lisinopril-hydrochlorothiazide (PRINZIDE,ZESTORETIC) 20-12.5 MG per tablet Take 1 tablet by mouth daily.      Marland Kitchen loratadine (CLARITIN) 10 MG tablet Take 10 mg by mouth daily.      . predniSONE (DELTASONE) 10 MG tablet Take 4 tabs daily for 4 days, 3 tabs daily for 4 days, 2 tabs daily for 4 days, then 1 tab daily for 4 days.  40 tablet  0  . cyclobenzaprine (FLEXERIL) 10 MG tablet Take 10 mg by mouth 3 (three) times daily as needed for  muscle spasms.      Marland Kitchen HYDROcodone-acetaminophen (NORCO/VICODIN) 5-325 MG per tablet 1 to 2 tabs every 4 to 6 hours as needed for pain.  20 tablet  0  . tranexamic acid (LYSTEDA) 650 MG TABS tablet Take 2 tablets by mouth 3 (three) times daily. Take 2 tablets TID for 5 days with menstrual start.       No current facility-administered medications for this visit.     REVIEW OF SYSTEMS:   Constitutional: Denies fevers, chills or night sweats Eyes: Denies blurriness of vision Ears, nose, mouth, throat, and face: Denies mucositis or sore throat Respiratory: Denies cough, dyspnea or wheezes Cardiovascular: Denies palpitation, chest discomfort or lower extremity swelling Gastrointestinal:  Denies nausea, heartburn or change in bowel habits Skin: Denies abnormal skin rashes Lymphatics: Denies new lymphadenopathy or easy bruising Neurological:Denies numbness, tingling or new weaknesses Behavioral/Psych: Mood is stable, no new changes  All other systems were reviewed with the patient and are negative.  PHYSICAL EXAMINATION: ECOG PERFORMANCE STATUS: 0 - Asymptomatic  Filed Vitals:   08/19/13 0840  BP: 126/81  Pulse: 82  Temp: 98.1 F (36.7 C)  Resp: 20   Filed Weights   08/19/13 0840  Weight: 356 lb 1.6 oz (161.526 kg)    GENERAL:alert, no distress and comfortable SKIN: skin color, texture, turgor are normal, no rashes or significant lesions EYES: normal, Conjunctiva are pink and non-injected, sclera clear OROPHARYNX:no exudate, no erythema and lips, buccal mucosa, and tongue normal  Musculoskeletal:no cyanosis of digits and no clubbing  NEURO:  alert & oriented x 3 with fluent speech, no focal motor/sensory deficits  LABORATORY DATA:  I have reviewed the data as listed No results found for this or any previous visit (from the past 48 hour(s)).  Lab Results  Component Value Date   WBC 11.9* 08/12/2013   HGB 11.8 08/12/2013   HCT 38.7 08/12/2013   MCV 66.1* 08/12/2013   PLT 335  08/12/2013    ASSESSMENT & PLAN:  Iron deficiency anemia secondary to blood loss (chronic) This is improving nicely with intravenous iron. I recommend she contact her primary care provider about treatment for menorrhagia. I recommend she has CBC and ferritin redrawn in 6 months and have the results faxed to me. If she has recurrence of anemia or iron deficiency again in the future, I will be happy to see her back.  Menorrhagia She has been referred to gynecologist for further management.  Thrombocytosis This has resolved with iron replacement.  Thalassemia trait I suspect she has thalassemia. Even though hemoglobinopathy evaluation did not detected major abnormalities, based on her microcytic appearance with elevated RBC, the patient likely had thalassemia. I discussed with her the genetic basis of thalassemia and would not recommend screening her children for now.    All questions were answered. The patient knows to call the clinic with any problems, questions or concerns. No barriers to learning was detected.  I spent 15 minutes counseling the patient face to face. The total time spent in the appointment was 20 minutes and more than 50% was on counseling.     Dodson, Cecil, MD 08/19/2013 2:30 PM

## 2013-08-19 NOTE — Assessment & Plan Note (Signed)
She has been referred to gynecologist for further management.

## 2013-09-14 ENCOUNTER — Telehealth: Payer: Self-pay | Admitting: Hematology and Oncology

## 2013-09-14 ENCOUNTER — Other Ambulatory Visit: Payer: Self-pay | Admitting: Hematology and Oncology

## 2013-09-14 ENCOUNTER — Telehealth: Payer: Self-pay | Admitting: *Deleted

## 2013-09-14 DIAGNOSIS — D500 Iron deficiency anemia secondary to blood loss (chronic): Secondary | ICD-10-CM

## 2013-09-14 NOTE — Telephone Encounter (Signed)
Pt states she took the Lysteda for 2 days, ~ 1 week ago. Stopped because it gave her "terrible side effects-speech crazy, headache". Is currently feeling very weak and Dr Alvy Bimler told her to call back if she is feeling weak. Has appt with Gyn on 10/05/13.

## 2013-09-14 NOTE — Telephone Encounter (Signed)
S/w pt gave appt for lab 7/22 @ 8.30am.

## 2013-09-15 ENCOUNTER — Telehealth: Payer: Self-pay | Admitting: *Deleted

## 2013-09-15 ENCOUNTER — Other Ambulatory Visit: Payer: Self-pay | Admitting: *Deleted

## 2013-09-15 ENCOUNTER — Other Ambulatory Visit (HOSPITAL_BASED_OUTPATIENT_CLINIC_OR_DEPARTMENT_OTHER): Payer: BC Managed Care – PPO

## 2013-09-15 DIAGNOSIS — D500 Iron deficiency anemia secondary to blood loss (chronic): Secondary | ICD-10-CM

## 2013-09-15 DIAGNOSIS — D509 Iron deficiency anemia, unspecified: Secondary | ICD-10-CM

## 2013-09-15 LAB — CBC & DIFF AND RETIC
BASO%: 0.9 % (ref 0.0–2.0)
BASOS ABS: 0.1 10*3/uL (ref 0.0–0.1)
EOS%: 1.5 % (ref 0.0–7.0)
Eosinophils Absolute: 0.1 10*3/uL (ref 0.0–0.5)
HEMATOCRIT: 36.6 % (ref 34.8–46.6)
HEMOGLOBIN: 11 g/dL — AB (ref 11.6–15.9)
IMMATURE RETIC FRACT: 25.3 % — AB (ref 1.60–10.00)
LYMPH%: 39.6 % (ref 14.0–49.7)
MCH: 21.3 pg — AB (ref 25.1–34.0)
MCHC: 30.1 g/dL — AB (ref 31.5–36.0)
MCV: 70.9 fL — AB (ref 79.5–101.0)
MONO#: 0.6 10*3/uL (ref 0.1–0.9)
MONO%: 10.3 % (ref 0.0–14.0)
NEUT#: 2.6 10*3/uL (ref 1.5–6.5)
NEUT%: 47.7 % (ref 38.4–76.8)
PLATELETS: 376 10*3/uL (ref 145–400)
RBC: 5.16 10*6/uL (ref 3.70–5.45)
RDW: 23.6 % — ABNORMAL HIGH (ref 11.2–14.5)
Retic %: 1.27 % (ref 0.70–2.10)
Retic Ct Abs: 65.53 10*3/uL (ref 33.70–90.70)
WBC: 5.5 10*3/uL (ref 3.9–10.3)
lymph#: 2.2 10*3/uL (ref 0.9–3.3)

## 2013-09-15 LAB — IRON AND TIBC CHCC
%SAT: 11 % — AB (ref 21–57)
Iron: 33 ug/dL — ABNORMAL LOW (ref 41–142)
TIBC: 300 ug/dL (ref 236–444)
UIBC: 267 ug/dL (ref 120–384)

## 2013-09-15 LAB — FERRITIN CHCC: Ferritin: 37 ng/ml (ref 9–269)

## 2013-09-15 NOTE — Telephone Encounter (Signed)
Spoke with pt and informed pt re:  Dr. Alvy Bimler reviewed lab results today - pt is anemic again, and will need iron.  Informed pt that she needs to come in for office visit on 09/16/13 at  0915 , and to receive  IV Iron after office visit.  Pt voiced understanding, and stated she would be here tomorrow.

## 2013-09-15 NOTE — Telephone Encounter (Signed)
Called pt x 2 unsuccessfully.  Left message on voice mail for pt to call nurse back.

## 2013-09-16 ENCOUNTER — Telehealth: Payer: Self-pay | Admitting: *Deleted

## 2013-09-16 ENCOUNTER — Ambulatory Visit (HOSPITAL_BASED_OUTPATIENT_CLINIC_OR_DEPARTMENT_OTHER): Payer: BC Managed Care – PPO | Admitting: Hematology and Oncology

## 2013-09-16 ENCOUNTER — Ambulatory Visit (HOSPITAL_BASED_OUTPATIENT_CLINIC_OR_DEPARTMENT_OTHER): Payer: BC Managed Care – PPO

## 2013-09-16 ENCOUNTER — Encounter: Payer: Self-pay | Admitting: Hematology and Oncology

## 2013-09-16 ENCOUNTER — Telehealth: Payer: Self-pay | Admitting: Hematology and Oncology

## 2013-09-16 VITALS — BP 124/89 | HR 67 | Temp 98.5°F | Resp 18

## 2013-09-16 VITALS — BP 163/91 | HR 77 | Temp 98.6°F | Resp 20 | Ht 68.0 in | Wt 353.6 lb

## 2013-09-16 DIAGNOSIS — N920 Excessive and frequent menstruation with regular cycle: Secondary | ICD-10-CM

## 2013-09-16 DIAGNOSIS — R51 Headache: Secondary | ICD-10-CM

## 2013-09-16 DIAGNOSIS — D500 Iron deficiency anemia secondary to blood loss (chronic): Secondary | ICD-10-CM

## 2013-09-16 DIAGNOSIS — R519 Headache, unspecified: Secondary | ICD-10-CM

## 2013-09-16 DIAGNOSIS — N921 Excessive and frequent menstruation with irregular cycle: Secondary | ICD-10-CM

## 2013-09-16 MED ORDER — SODIUM CHLORIDE 0.9 % IV SOLN
Freq: Once | INTRAVENOUS | Status: AC
  Administered 2013-09-16: 10:00:00 via INTRAVENOUS

## 2013-09-16 MED ORDER — FERUMOXYTOL INJECTION 510 MG/17 ML
1020.0000 mg | Freq: Once | INTRAVENOUS | Status: AC
  Administered 2013-09-16: 1020 mg via INTRAVENOUS
  Filled 2013-09-16: qty 34

## 2013-09-16 NOTE — Telephone Encounter (Signed)
Per staff message and POF I have scheduled appts. Advised scheduler of appts. JMW  

## 2013-09-16 NOTE — Patient Instructions (Signed)

## 2013-09-16 NOTE — Assessment & Plan Note (Signed)
I recommend she discuss with her gynecologist other form of treatment for menorrhagia. She does not appear to tolerate tranxenamic acid.

## 2013-09-16 NOTE — Assessment & Plan Note (Signed)
The most likely cause of her anemia is due to chronic blood loss. We discussed some of the risks, benefits, and alternatives of intravenous iron infusions. The patient is symptomatic from anemia and the iron level is critically low. She tolerated oral iron supplement poorly and desires to achieved higher levels of iron faster for adequate hematopoesis. Some of the side-effects to be expected including risks of infusion reactions, phlebitis, headaches, nausea and fatigue.  The patient is willing to proceed. Patient education material was dispensed.  Goal is to keep ferritin level greater than 50 I will see her back again within a month to make sure she does not require further intravenous iron infusion.

## 2013-09-16 NOTE — Telephone Encounter (Signed)
Pt confirmed labs/ov per 07/23 POF, gave pt AVS.....KJ °

## 2013-09-16 NOTE — Assessment & Plan Note (Signed)
This could be related to anemia and high blood pressure. I reassured the patient.

## 2013-09-16 NOTE — Progress Notes (Signed)
[place] OFFICE PROGRESS NOTE  Cynthia Coma, MD SUMMARY OF HEMATOLOGIC HISTORY:  She was found to have abnormal CBC from routine blood work. In November 2008, she has anemia with hemoglobin of 9.7, MCV of 58.4 with platelet count elevation at 469. On 07/13/2013, she is still anemic with hemoglobin 9.1, MCV of 60.7 and elevated platelet count of 434. Iron studies performed in her physician's office was very low. She denies recent chest pain on exertion, shortness of breath on minimal exertion, or palpitations. She did complain of profound weakness, dizziness as well as new onset of leg cramps. She had not noticed any recent bleeding such as epistaxis, hematuria or hematochezia. She does complain of excessive menstruation. She has periods 7-10 days every month with significant heavy periods in the first few days of each menstrual cycle. On 07/15/2013, she received one dose of intravenous iron. INTERVAL HISTORY: Cynthia Vazquez 43 y.o. female returns for further followup. Repeat CBC and iron studies show she has recurrence of iron deficiency anemia. She still had persistent menorrhagia. She was prescribed Tranexamic acid but that made her feel unwell with some headaches and cramps. The patient is very tearful and she feels weak.  I have reviewed the past medical history, past surgical history, social history and family history with the patient and they are unchanged from previous note.  ALLERGIES:  is allergic to augmentin.  MEDICATIONS:  Current Outpatient Prescriptions  Medication Sig Dispense Refill  . ibuprofen (ADVIL,MOTRIN) 200 MG tablet Take 200 mg by mouth every 6 (six) hours as needed.      Marland Kitchen lisinopril-hydrochlorothiazide (PRINZIDE,ZESTORETIC) 20-12.5 MG per tablet Take 1 tablet by mouth daily.      Marland Kitchen loratadine (CLARITIN) 10 MG tablet Take 10 mg by mouth daily.       No current facility-administered medications for this visit.   Facility-Administered  Medications Ordered in Other Visits  Medication Dose Route Frequency Provider Last Rate Last Dose  . 0.9 %  sodium chloride infusion   Intravenous Once Heath Lark, MD      . ferumoxytol (FERAHEME) 1,020 mg in sodium chloride 0.9 % 100 mL IVPB  1,020 mg Intravenous Once Heath Lark, MD         REVIEW OF SYSTEMS:   Constitutional: Denies fevers, chills or night sweats Eyes: Denies blurriness of vision Ears, nose, mouth, throat, and face: Denies mucositis or sore throat Respiratory: Denies cough, dyspnea or wheezes Cardiovascular: Denies palpitation, chest discomfort or lower extremity swelling Gastrointestinal:  Denies nausea, heartburn or change in bowel habits Skin: Denies abnormal skin rashes Lymphatics: Denies new lymphadenopathy or easy bruising Behavioral/Psych: Mood is stable, no new changes  All other systems were reviewed with the patient and are negative.  PHYSICAL EXAMINATION: ECOG PERFORMANCE STATUS: 1 - Symptomatic but completely ambulatory  Filed Vitals:   09/16/13 0918  BP: 163/91  Pulse: 77  Temp: 98.6 F (37 C)  Resp: 20   Filed Weights   09/16/13 0918  Weight: 353 lb 9.6 oz (160.392 kg)    GENERAL:alert, no distress and comfortable. She is morbidly obese SKIN: skin color, texture, turgor are normal, no rashes or significant lesions EYES: normal, Conjunctiva are pink and non-injected, sclera clear Musculoskeletal:no cyanosis of digits and no clubbing  NEURO: alert & oriented x 3 with fluent speech, no focal motor/sensory deficits  LABORATORY DATA:  I have reviewed the data as listed Results for orders placed in visit on 09/15/13 (from the past 48 hour(s))  CBC & DIFF  AND RETIC     Status: Abnormal   Collection Time    09/15/13  8:44 AM      Result Value Ref Range   WBC 5.5  3.9 - 10.3 10e3/uL   NEUT# 2.6  1.5 - 6.5 10e3/uL   HGB 11.0 (*) 11.6 - 15.9 g/dL   HCT 36.6  34.8 - 46.6 %   Platelets 376  145 - 400 10e3/uL   MCV 70.9 (*) 79.5 - 101.0 fL   MCH  21.3 (*) 25.1 - 34.0 pg   MCHC 30.1 (*) 31.5 - 36.0 g/dL   RBC 5.16  3.70 - 5.45 10e6/uL   RDW 23.6 (*) 11.2 - 14.5 %   lymph# 2.2  0.9 - 3.3 10e3/uL   MONO# 0.6  0.1 - 0.9 10e3/uL   Eosinophils Absolute 0.1  0.0 - 0.5 10e3/uL   Basophils Absolute 0.1  0.0 - 0.1 10e3/uL   NEUT% 47.7  38.4 - 76.8 %   LYMPH% 39.6  14.0 - 49.7 %   MONO% 10.3  0.0 - 14.0 %   EOS% 1.5  0.0 - 7.0 %   BASO% 0.9  0.0 - 2.0 %   Retic % 1.27  0.70 - 2.10 %   Retic Ct Abs 65.53  33.70 - 90.70 10e3/uL   Immature Retic Fract 25.30 (*) 1.60 - 10.00 %  FERRITIN [facility]     Status: None   Collection Time    09/15/13  8:44 AM      Result Value Ref Range   Ferritin 37  9 - 269 ng/ml  IRON AND TIBC [facility]     Status: Abnormal   Collection Time    09/15/13  8:44 AM      Result Value Ref Range   Iron 33 (*) 41 - 142 ug/dL   TIBC 300  236 - 444 ug/dL   UIBC 267  120 - 384 ug/dL   %SAT 11 (*) 21 - 57 %    Lab Results  Component Value Date   WBC 5.5 09/15/2013   HGB 11.0* 09/15/2013   HCT 36.6 09/15/2013   MCV 70.9* 09/15/2013   PLT 376 09/15/2013    ASSESSMENT & PLAN:  Iron deficiency anemia secondary to blood loss (chronic) The most likely cause of her anemia is due to chronic blood loss. We discussed some of the risks, benefits, and alternatives of intravenous iron infusions. The patient is symptomatic from anemia and the iron level is critically low. She tolerated oral iron supplement poorly and desires to achieved higher levels of iron faster for adequate hematopoesis. Some of the side-effects to be expected including risks of infusion reactions, phlebitis, headaches, nausea and fatigue.  The patient is willing to proceed. Patient education material was dispensed.  Goal is to keep ferritin level greater than 50 I will see her back again within a month to make sure she does not require further intravenous iron infusion.  Menorrhagia I recommend she discuss with her gynecologist other form of treatment for  menorrhagia. She does not appear to tolerate tranxenamic acid.  Headache This could be related to anemia and high blood pressure. I reassured the patient.    All questions were answered. The patient knows to call the clinic with any problems, questions or concerns. No barriers to learning was detected.  I spent 25 minutes counseling the patient face to face. The total time spent in the appointment was 30 minutes and more than 50% was on counseling.  [place], Teresita, MD 09/16/2013 10:13 AM

## 2013-09-23 ENCOUNTER — Ambulatory Visit: Payer: BC Managed Care – PPO | Admitting: Hematology and Oncology

## 2013-10-15 ENCOUNTER — Telehealth: Payer: Self-pay | Admitting: Hematology and Oncology

## 2013-10-15 ENCOUNTER — Other Ambulatory Visit (HOSPITAL_BASED_OUTPATIENT_CLINIC_OR_DEPARTMENT_OTHER): Payer: BC Managed Care – PPO

## 2013-10-15 ENCOUNTER — Telehealth: Payer: Self-pay | Admitting: *Deleted

## 2013-10-15 ENCOUNTER — Other Ambulatory Visit: Payer: Self-pay | Admitting: Hematology and Oncology

## 2013-10-15 DIAGNOSIS — D500 Iron deficiency anemia secondary to blood loss (chronic): Secondary | ICD-10-CM

## 2013-10-15 LAB — CBC & DIFF AND RETIC
BASO%: 0.7 % (ref 0.0–2.0)
Basophils Absolute: 0.1 10*3/uL (ref 0.0–0.1)
EOS ABS: 0.2 10*3/uL (ref 0.0–0.5)
EOS%: 2.6 % (ref 0.0–7.0)
HEMATOCRIT: 38.2 % (ref 34.8–46.6)
HGB: 12 g/dL (ref 11.6–15.9)
IMMATURE RETIC FRACT: 20.5 % — AB (ref 1.60–10.00)
LYMPH#: 2.6 10*3/uL (ref 0.9–3.3)
LYMPH%: 37.2 % (ref 14.0–49.7)
MCH: 23.6 pg — ABNORMAL LOW (ref 25.1–34.0)
MCHC: 31.4 g/dL — AB (ref 31.5–36.0)
MCV: 75.2 fL — ABNORMAL LOW (ref 79.5–101.0)
MONO#: 0.7 10*3/uL (ref 0.1–0.9)
MONO%: 10.5 % (ref 0.0–14.0)
NEUT%: 49 % (ref 38.4–76.8)
NEUTROS ABS: 3.4 10*3/uL (ref 1.5–6.5)
NRBC: 0 % (ref 0–0)
Platelets: 273 10*3/uL (ref 145–400)
RBC: 5.08 10*6/uL (ref 3.70–5.45)
RDW: 20 % — ABNORMAL HIGH (ref 11.2–14.5)
RETIC CT ABS: 84.33 10*3/uL (ref 33.70–90.70)
Retic %: 1.66 % (ref 0.70–2.10)
WBC: 6.9 10*3/uL (ref 3.9–10.3)

## 2013-10-15 LAB — IRON AND TIBC CHCC
%SAT: 20 % — ABNORMAL LOW (ref 21–57)
Iron: 50 ug/dL (ref 41–142)
TIBC: 244 ug/dL (ref 236–444)
UIBC: 194 ug/dL (ref 120–384)

## 2013-10-15 LAB — FERRITIN CHCC: FERRITIN: 198 ng/mL (ref 9–269)

## 2013-10-15 NOTE — Telephone Encounter (Signed)
s.w pt and advised on NOV appt....pt ok adn aware °

## 2013-10-15 NOTE — Telephone Encounter (Signed)
Notified patient of message below. Pt pleased with results. OK to reschedule for 3 months. AM best, any day of week.

## 2013-10-15 NOTE — Telephone Encounter (Signed)
Message copied by Patton Salles on Fri Oct 15, 2013 12:31 PM ------      Message from: [facility], [place]: Fri Oct 15, 2013 10:33 AM      Regarding: lab result       Pls let her know that ferritin is now >100. I recommend rescheduling her appt to 3 months with lab appt 1 week prior. Please ask her if this is OK, then I will put in new POF. Please also ask her what day of the week and whether she wants am or pm appt      ----- Message -----         From: [facility]         Sent: 10/15/2013   8:59 AM           To: Heath Lark, MD                   ------

## 2013-10-19 ENCOUNTER — Ambulatory Visit: Payer: BC Managed Care – PPO | Admitting: Hematology and Oncology

## 2014-01-05 ENCOUNTER — Other Ambulatory Visit: Payer: Self-pay | Admitting: Hematology and Oncology

## 2014-01-07 ENCOUNTER — Other Ambulatory Visit (HOSPITAL_BASED_OUTPATIENT_CLINIC_OR_DEPARTMENT_OTHER): Payer: BC Managed Care – PPO

## 2014-01-07 DIAGNOSIS — D500 Iron deficiency anemia secondary to blood loss (chronic): Secondary | ICD-10-CM

## 2014-01-07 LAB — CBC & DIFF AND RETIC
BASO%: 0.6 % (ref 0.0–2.0)
BASOS ABS: 0 10*3/uL (ref 0.0–0.1)
EOS ABS: 0.1 10*3/uL (ref 0.0–0.5)
EOS%: 2.7 % (ref 0.0–7.0)
HCT: 39.9 % (ref 34.8–46.6)
HGB: 12.6 g/dL (ref 11.6–15.9)
Immature Retic Fract: 9.6 % (ref 1.60–10.00)
LYMPH%: 43.4 % (ref 14.0–49.7)
MCH: 24.2 pg — ABNORMAL LOW (ref 25.1–34.0)
MCHC: 31.6 g/dL (ref 31.5–36.0)
MCV: 76.7 fL — ABNORMAL LOW (ref 79.5–101.0)
MONO#: 0.6 10*3/uL (ref 0.1–0.9)
MONO%: 11.9 % (ref 0.0–14.0)
NEUT%: 41.4 % (ref 38.4–76.8)
NEUTROS ABS: 2 10*3/uL (ref 1.5–6.5)
PLATELETS: 296 10*3/uL (ref 145–400)
RBC: 5.2 10*6/uL (ref 3.70–5.45)
RDW: 14.9 % — ABNORMAL HIGH (ref 11.2–14.5)
RETIC %: 1.21 % (ref 0.70–2.10)
Retic Ct Abs: 62.92 10*3/uL (ref 33.70–90.70)
WBC: 4.8 10*3/uL (ref 3.9–10.3)
lymph#: 2.1 10*3/uL (ref 0.9–3.3)

## 2014-01-07 LAB — IRON AND TIBC CHCC
%SAT: 13 % — ABNORMAL LOW (ref 21–57)
IRON: 37 ug/dL — AB (ref 41–142)
TIBC: 293 ug/dL (ref 236–444)
UIBC: 256 ug/dL (ref 120–384)

## 2014-01-07 LAB — FERRITIN CHCC: Ferritin: 54 ng/ml (ref 9–269)

## 2014-01-10 ENCOUNTER — Telehealth: Payer: Self-pay | Admitting: *Deleted

## 2014-01-10 ENCOUNTER — Telehealth: Payer: Self-pay | Admitting: Hematology and Oncology

## 2014-01-10 ENCOUNTER — Other Ambulatory Visit: Payer: Self-pay | Admitting: Hematology and Oncology

## 2014-01-10 DIAGNOSIS — D500 Iron deficiency anemia secondary to blood loss (chronic): Secondary | ICD-10-CM

## 2014-01-10 NOTE — Telephone Encounter (Signed)
s.w. pt and advisedon March 2016 appt....pt ok and aware °

## 2014-01-10 NOTE — Telephone Encounter (Signed)
Pt notified of results below. OK to be seen in 4 months

## 2014-01-10 NOTE — Telephone Encounter (Signed)
-----   Message from Heath Lark, MD sent at 01/09/2014  7:38 PM EST ----- Regarding: call pt Labs look good, no need iv iron. Recommend recheck in 4 months. If OK with patient, can cancel her return next week and reschedule to 4 months with labs to be done 1 week prior ----- Message -----    From: [facility]    Sent: 01/07/2014   8:47 AM      To: Heath Lark, MD

## 2014-01-10 NOTE — Telephone Encounter (Signed)
Left message to call.

## 2014-01-11 ENCOUNTER — Ambulatory Visit: Payer: BC Managed Care – PPO | Admitting: Hematology and Oncology

## 2014-01-16 ENCOUNTER — Emergency Department (INDEPENDENT_AMBULATORY_CARE_PROVIDER_SITE_OTHER)
Admission: EM | Admit: 2014-01-16 | Discharge: 2014-01-16 | Disposition: A | Discharge status: Home / Self Care | Payer: BC Managed Care – PPO | Source: Home / Self Care | Attending: Family Medicine | Admitting: Family Medicine

## 2014-01-16 ENCOUNTER — Encounter (HOSPITAL_COMMUNITY): Payer: Self-pay | Admitting: Emergency Medicine

## 2014-01-16 DIAGNOSIS — H6691 Otitis media, unspecified, right ear: Secondary | ICD-10-CM

## 2014-01-16 MED ORDER — CEFDINIR 300 MG PO CAPS: 300.0000 mg | ORAL_CAPSULE | Freq: Two times a day (BID) | ORAL | Status: DC

## 2014-01-16 MED ORDER — IPRATROPIUM BROMIDE 0.06 % NA SOLN: 2.0000 | Freq: Four times a day (QID) | NASAL | Status: DC

## 2014-01-16 NOTE — Discharge Instructions (Signed)
In addition to medications you have been prescribed, please use either ibuprofen or tylenol as directed on packaging for pain and Sudafed as directed on packaging for nasal decongestion.  Otitis Media Otitis media is redness, soreness, and inflammation of the middle ear. Otitis media may be caused by allergies or, most commonly, by infection. Often it occurs as a complication of the common cold. SIGNS AND SYMPTOMS Symptoms of otitis media may include:  Earache.  Fever.  Ringing in your ear.  Headache.  Leakage of fluid from the ear. DIAGNOSIS To diagnose otitis media, your health care provider will examine your ear with an otoscope. This is an instrument that allows your health care provider to see into your ear in order to examine your eardrum. Your health care provider also will ask you questions about your symptoms. TREATMENT  Typically, otitis media resolves on its own within 3-5 days. Your health care provider may prescribe medicine to ease your symptoms of pain. If otitis media does not resolve within 5 days or is recurrent, your health care provider may prescribe antibiotic medicines if he or she suspects that a bacterial infection is the cause. HOME CARE INSTRUCTIONS   If you were prescribed an antibiotic medicine, finish it all even if you start to feel better.  Take medicines only as directed by your health care provider.  Keep all follow-up visits as directed by your health care provider. SEEK MEDICAL CARE IF:  You have otitis media only in one ear, or bleeding from your nose, or both.  You notice a lump on your neck.  You are not getting better in 3-5 days.  You feel worse instead of better. SEEK IMMEDIATE MEDICAL CARE IF:   You have pain that is not controlled with medicine.  You have swelling, redness, or pain around your ear or stiffness in your neck.  You notice that part of your face is paralyzed.  You notice that the bone behind your ear (mastoid) is  tender when you touch it. MAKE SURE YOU:   Understand these instructions.  Will watch your condition.  Will get help right away if you are not doing well or get worse. Document Released: 11/17/2003 Document Revised: 06/28/2013 Document Reviewed: 09/08/2012 [facility] Patient Information 2015 [place], [place]. This information is not intended to replace advice given to you by your health care provider. Make sure you discuss any questions you have with your health care provider.

## 2014-01-16 NOTE — ED Notes (Signed)
Pt states that she has had a headache and sinus pain for 8 days now.

## 2014-01-16 NOTE — ED Provider Notes (Signed)
CSN: [id number]     Arrival date & time 01/16/14  1740 History   First MD Initiated Contact with Patient 01/16/14 1751     Chief Complaint  Patient presents with  . Facial Pain  . Otalgia   (Consider location/radiation/quality/duration/timing/severity/associated sxs/prior Treatment) HPI Comments: Her primary complaints tonight are that of facial pain and swelling and right ear pain.   Patient is a 43 y.o. female presenting with URI. The history is provided by the patient.  URI Presenting symptoms: congestion, ear pain, facial pain and rhinorrhea   Presenting symptoms: no fever and no sore throat   Severity:  Moderate Onset quality:  Gradual Duration:  10 days Timing:  Constant Progression:  Worsening Chronicity:  New Associated symptoms: headaches and sinus pain   Associated symptoms: no arthralgias, no myalgias, no neck pain, no sneezing, no swollen glands and no wheezing     Past Medical History  Diagnosis Date  . Hypertension    Past Surgical History  Procedure Laterality Date  . Cholecystectomy    . Cesarean section     Family History  Problem Relation Age of Onset  . Anemia Mother   . Anemia Paternal Grandfather    History  Substance Use Topics  . Smoking status: Never Smoker   . Smokeless tobacco: Never Used  . Alcohol Use: No   OB History    No data available     Review of Systems  Constitutional: Negative for fever.  HENT: Positive for congestion, ear pain, postnasal drip and rhinorrhea. Negative for sneezing and sore throat.   Eyes: Negative.   Respiratory: Negative.  Negative for wheezing.   Cardiovascular: Negative.   Gastrointestinal: Negative.   Musculoskeletal: Negative for myalgias, arthralgias and neck pain.  Neurological: Positive for headaches.    Allergies  Augmentin  Home Medications   Prior to Admission medications   Medication Sig Start Date End Date Taking? Authorizing Provider  lisinopril-hydrochlorothiazide  (PRINZIDE,ZESTORETIC) 20-12.5 MG per tablet Take 1 tablet by mouth daily.   Yes Historical Provider, MD  loratadine (CLARITIN) 10 MG tablet Take 10 mg by mouth daily.   Yes Historical Provider, MD  cefdinir (OMNICEF) 300 MG capsule Take 1 capsule (300 mg total) by mouth 2 (two) times daily. X 7 days 01/16/14   Audelia Hives Andalyn Heckstall, PA  ibuprofen (ADVIL,MOTRIN) 200 MG tablet Take 200 mg by mouth every 6 (six) hours as needed.    Historical Provider, MD  ipratropium (ATROVENT) 0.06 % nasal spray Place 2 sprays into both nostrils 4 (four) times daily. For nasal congestion 01/16/14   Annett Gula H Hildy Nicholl, PA   BP 125/91 mmHg  Temp(Src) 98.1 F (36.7 C) (Oral)  Resp 16  SpO2 96%  LMP 01/05/2014 Physical Exam  Constitutional: She is oriented to person, place, and time. She appears well-developed and well-nourished. No distress.  +obese  HENT:  Head: Normocephalic and atraumatic.  Right Ear: Hearing, external ear and ear canal normal. Tympanic membrane is injected and erythematous. A middle ear effusion is present.  Left Ear: Hearing, tympanic membrane, external ear and ear canal normal.  Nose: Mucosal edema and rhinorrhea present. Right sinus exhibits maxillary sinus tenderness. Left sinus exhibits maxillary sinus tenderness.  Mouth/Throat: Uvula is midline, oropharynx is clear and moist and mucous membranes are normal.  Eyes: Conjunctivae are normal. No scleral icterus.  Neck: Normal range of motion. Neck supple.  Cardiovascular: Normal rate, regular rhythm and normal heart sounds.   Pulmonary/Chest: Effort normal and breath sounds normal.  Lymphadenopathy:    She has no cervical adenopathy.  Neurological: She is alert and oriented to person, place, and time.  Skin: Skin is warm and dry. No rash noted. No erythema.  Psychiatric: She has a normal mood and affect. Her behavior is normal.  Nursing note and vitals reviewed.   ED Course  Procedures (including critical care time) Labs  Review Labs Reviewed - No data to display  Imaging Review No results found.   MDM   1. Acute right otitis media, recurrence not specified, unspecified otitis media type   Omnicef as prescribed for right AOM. ibuprofen or tylenol as directed on packaging for pain and Sudafed as directed on packaging for nasal decongestion. Will also prescribed Atrovent nasal spray for nasal congestion. PCP follow up if no improvement.    Lutricia Feil, [place] 01/16/14 [phone]

## 2014-03-03 ENCOUNTER — Encounter (HOSPITAL_COMMUNITY): Payer: Self-pay | Admitting: Emergency Medicine

## 2014-03-03 ENCOUNTER — Emergency Department (HOSPITAL_COMMUNITY)
Admission: EM | Admit: 2014-03-03 | Discharge: 2014-03-03 | Disposition: A | Discharge status: Home / Self Care | Payer: BLUE CROSS/BLUE SHIELD | Attending: Emergency Medicine | Admitting: Emergency Medicine

## 2014-03-03 DIAGNOSIS — S39012A Strain of muscle, fascia and tendon of lower back, initial encounter: Secondary | ICD-10-CM | POA: Diagnosis not present

## 2014-03-03 DIAGNOSIS — Y9289 Other specified places as the place of occurrence of the external cause: Secondary | ICD-10-CM | POA: Diagnosis not present

## 2014-03-03 DIAGNOSIS — Z791 Long term (current) use of non-steroidal anti-inflammatories (NSAID): Secondary | ICD-10-CM | POA: Insufficient documentation

## 2014-03-03 DIAGNOSIS — R5383 Other fatigue: Secondary | ICD-10-CM | POA: Diagnosis not present

## 2014-03-03 DIAGNOSIS — Z79899 Other long term (current) drug therapy: Secondary | ICD-10-CM | POA: Insufficient documentation

## 2014-03-03 DIAGNOSIS — Y9389 Activity, other specified: Secondary | ICD-10-CM | POA: Insufficient documentation

## 2014-03-03 DIAGNOSIS — E669 Obesity, unspecified: Secondary | ICD-10-CM | POA: Insufficient documentation

## 2014-03-03 DIAGNOSIS — Y998 Other external cause status: Secondary | ICD-10-CM | POA: Diagnosis not present

## 2014-03-03 DIAGNOSIS — X58XXXA Exposure to other specified factors, initial encounter: Secondary | ICD-10-CM | POA: Insufficient documentation

## 2014-03-03 DIAGNOSIS — I10 Essential (primary) hypertension: Secondary | ICD-10-CM | POA: Insufficient documentation

## 2014-03-03 DIAGNOSIS — M549 Dorsalgia, unspecified: Secondary | ICD-10-CM | POA: Diagnosis present

## 2014-03-03 LAB — I-STAT CHEM 8, ED
BUN: 9 mg/dL (ref 6–23)
CALCIUM ION: 1.15 mmol/L (ref 1.12–1.23)
CHLORIDE: 102 meq/L (ref 96–112)
CREATININE: 0.8 mg/dL (ref 0.50–1.10)
Glucose, Bld: 130 mg/dL — ABNORMAL HIGH (ref 70–99)
HCT: 45 % (ref 36.0–46.0)
Hemoglobin: 15.3 g/dL — ABNORMAL HIGH (ref 12.0–15.0)
POTASSIUM: 3.7 mmol/L (ref 3.5–5.1)
Sodium: 137 mmol/L (ref 135–145)
TCO2: 24 mmol/L (ref 0–100)

## 2014-03-03 MED ORDER — OXYCODONE-ACETAMINOPHEN 5-325 MG PO TABS: ORAL_TABLET | ORAL | Status: DC

## 2014-03-03 MED ORDER — OXYCODONE-ACETAMINOPHEN 5-325 MG PO TABS: 2.0000 | ORAL_TABLET | Freq: Once | ORAL | Status: DC

## 2014-03-03 MED ORDER — OXYCODONE-ACETAMINOPHEN 5-325 MG PO TABS
1.0000 | ORAL_TABLET | Freq: Once | ORAL | Status: AC
  Administered 2014-03-03: 1 via ORAL
  Filled 2014-03-03: qty 1

## 2014-03-03 NOTE — ED Provider Notes (Signed)
CSN: [id number]     Arrival date & time 03/03/14  1714 History   First MD Initiated Contact with Patient 03/03/14 1858    This chart was scribed for non-physician practitioner, Monico Blitz, PA, working with Tanna Furry, MD by Terressa Koyanagi, ED Scribe. This patient was seen in room WTR8/WTR8 and the patient's care was started at 7:08 PM.  Chief Complaint  Patient presents with  . Fatigue  . Back Pain   The history is provided by the patient. No language interpreter was used.   PCP: [name], MD HPI Comments: Cynthia Vazquez is a 44 y.o. female, with medical Hx noted below including HTN, who presents to the Emergency Department complaining of sudden onset, intermittent, ongoing bilateral back pain onset 2 weeks ago. Pt rates her current pain is a 12 out of 10, and states the pain is similar to those experienced during child birth. Pt notes she began working out this past November and believes this may be a contributing cause to her back pain. Pt denies any changes in urinary Sx or bowel movements, fevers, Hx of cancer, IV drug use.   Pt also complains of fatigue. Pt notes that she receives iron infusions at the cancer center with her last infusion taking place on 10/2013. Pt further reports she had labs completed this past November.   Past Medical History  Diagnosis Date  . Hypertension    Past Surgical History  Procedure Laterality Date  . Cholecystectomy    . Cesarean section     Family History  Problem Relation Age of Onset  . Anemia Mother   . Anemia Paternal Grandfather    History  Substance Use Topics  . Smoking status: Never Smoker   . Smokeless tobacco: Never Used  . Alcohol Use: No   OB History    No data available     Review of Systems  Constitutional: Positive for fatigue. Negative for fever and chills.  Musculoskeletal: Positive for back pain (mid, bilateral back pain).  Psychiatric/Behavioral: Negative for confusion.  All other systems reviewed and are  negative.     Allergies  Augmentin  Home Medications   Prior to Admission medications   Medication Sig Start Date End Date Taking? Authorizing Provider  cefdinir (OMNICEF) 300 MG capsule Take 1 capsule (300 mg total) by mouth 2 (two) times daily. X 7 days 01/16/14   Audelia Hives Presson, PA  ibuprofen (ADVIL,MOTRIN) 200 MG tablet Take 200 mg by mouth every 6 (six) hours as needed.    Historical Provider, MD  ipratropium (ATROVENT) 0.06 % nasal spray Place 2 sprays into both nostrils 4 (four) times daily. For nasal congestion 01/16/14   Audelia Hives Presson, PA  lisinopril-hydrochlorothiazide (PRINZIDE,ZESTORETIC) 20-12.5 MG per tablet Take 1 tablet by mouth daily.    Historical Provider, MD  loratadine (CLARITIN) 10 MG tablet Take 10 mg by mouth daily.    Historical Provider, MD   Triage Vitals: BP 150/88 mmHg  Pulse 101  Temp(Src) 98 F (36.7 C) (Oral)  Resp 18  SpO2 100%  LMP 01/31/2014 (Approximate) Physical Exam  Constitutional: She is oriented to person, place, and time. She appears well-developed and well-nourished. No distress.  Obese  HENT:  Head: Normocephalic and atraumatic.  Eyes: Conjunctivae and EOM are normal.  Neck: Neck supple.  Cardiovascular: Normal rate.   Pulmonary/Chest: Effort normal. No respiratory distress.  Musculoskeletal: Normal range of motion. She exhibits tenderness.       Back:  No point tenderness  to percussion of lumbar spinal processes.  No TTP or paraspinal muscular spasm. Strength is 5 out of 5 to bilateral lower extremities at hip and knee; extensor hallucis longus 5 out of 5. Ankle strength 5 out of 5, no clonus, neurovascularly intact. No saddle anaesthesia. Patellar reflexes are 2+ bilaterally.    Ambulates with a coordinated in nonantalgic gait.   Neurological: She is alert and oriented to person, place, and time.  Skin: Skin is warm and dry.  Psychiatric: She has a normal mood and affect. Her behavior is normal.  Nursing note  and vitals reviewed.   ED Course  Procedures (including critical care time) DIAGNOSTIC STUDIES: Oxygen Saturation is 100% on RA, nl by my interpretation.    COORDINATION OF CARE: 7:12 PM-Discussed treatment plan which includes meds and lab with pt at bedside and pt agreed to plan.   Labs Review Labs Reviewed  I-STAT CHEM 8, ED - Abnormal; Notable for the following:    Glucose, Bld 130 (*)    Hemoglobin 15.3 (*)    All other components within normal limits    Imaging Review No results found.   EKG Interpretation None      MDM   Final diagnoses:  Other fatigue  Lumbar strain, initial encounter    Filed Vitals:   03/03/14 1803  BP: 150/88  Pulse: 101  Temp: 98 F (36.7 C)  TempSrc: Oral  Resp: 18  SpO2: 100%    Medications  oxyCODONE-acetaminophen (PERCOCET/ROXICET) 5-325 MG per tablet 1 tablet (1 tablet Oral Given 03/03/14 1918)    Rayshawn Visconti is a pleasant 44 y.o. female presenting with  back pain.  No neurological deficits and normal neuro exam.  Patient can walk but states is painful.  No loss of bowel or bladder control.  No concern for cauda equina.  No fever, night sweats, weight loss, h/o cancer, IVDU.  RICE protocol and pain medicine indicated and discussed with patient.  Patient with chronic anemia reporting generalized fatigue, we'll check i-STAT Chem-8.  No anemia seem on H&H, and asked her to follow with her primary care doctor.   Evaluation does not show pathology that would require ongoing emergent intervention or inpatient treatment. Pt is hemodynamically stable and mentating appropriately. Discussed findings and plan with patient/guardian, who agrees with care plan. All questions answered. Return precautions discussed and outpatient follow up given.   Discharge Medication List as of 03/03/2014  7:58 PM    START taking these medications   Details  oxyCODONE-acetaminophen (PERCOCET/ROXICET) 5-325 MG per tablet 1 to 2 tabs PO q6hrs  PRN for pain,  Print         I personally performed the services described in this documentation, which was scribed in my presence. The recorded information has been reviewed and is accurate.     Monico Blitz, PA-C 03/03/14 2024  Tanna Furry, MD 03/14/14 [phone]

## 2014-03-03 NOTE — ED Notes (Signed)
Pt c/o mid, bilateral back pain x2 weeks. Trying [place] and stretching. Has been exercising recently and thinks this may be attributed to exercising. Receives iron infusions at the cancer center. Last infusion was September. Denies SOB, chest pain, N/V/D/F. Hasn't taken any medications today. Usually takes ibuprofen but has had no alleviation of pain. Ambulatory with steady but slow gait. RR even/unlabored. No other questions/concerns.

## 2014-03-03 NOTE — Discharge Instructions (Signed)
Please follow with your primary care doctor in the next 2 days for a check-up. They must obtain records for further management.   Do not hesitate to return to the Emergency Department for any new, worsening or concerning symptoms.   Take percocet for breakthrough pain, do not drink alcohol, drive, care for children or do other critical tasks while taking percocet.  . Back Exercises Back exercises help treat and prevent back injuries. The goal is to increase your strength in your belly (abdominal) and back muscles. These exercises can also help with flexibility. Start these exercises when told by your doctor. HOME CARE Back exercises include: Pelvic Tilt.  Lie on your back with your knees bent. Tilt your pelvis until the lower part of your back is against the floor. Hold this position 5 to 10 sec. Repeat this exercise 5 to 10 times. Knee to Chest.  Pull 1 knee up against your chest and hold for 20 to 30 seconds. Repeat this with the other knee. This may be done with the other leg straight or bent, whichever feels better. Then, pull both knees up against your chest. Sit-Ups or Curl-Ups.  Bend your knees 90 degrees. Start with tilting your pelvis, and do a partial, slow sit-up. Only lift your upper half 30 to 45 degrees off the floor. Take at least 2 to 3 seonds for each sit-up. Do not do sit-ups with your knees out straight. If partial sit-ups are difficult, simply do the above but with only tightening your belly (abdominal) muscles and holding it as told. Hip-Lift.  Lie on your back with your knees flexed 90 degrees. Push down with your feet and shoulders as you raise your hips 2 inches off the floor. Hold for 10 seconds, repeat 5 to 10 times. Back Arches.  Lie on your stomach. Prop yourself up on bent elbows. Slowly press on your hands, causing an arch in your low back. Repeat 3 to 5 times. Shoulder-Lifts.  Lie face down with arms beside your body. Keep hips and belly pressed to floor as  you slowly lift your head and shoulders off the floor. Do not overdo your exercises. Be careful in the beginning. Exercises may cause you some mild back discomfort. If the pain lasts for more than 15 minutes, stop the exercises until you see your doctor. Improvement with exercise for back problems is slow.  Document Released: 03/16/2010 Document Revised: 05/06/2011 Document Reviewed: 12/13/2010 [facility] Patient Information 2015 [place], [place]. This information is not intended to replace advice given to you by your health care provider. Make sure you discuss any questions you have with your health care provider.

## 2014-03-06 ENCOUNTER — Encounter (HOSPITAL_COMMUNITY): Payer: Self-pay

## 2014-03-06 ENCOUNTER — Emergency Department (HOSPITAL_COMMUNITY)
Admission: EM | Admit: 2014-03-06 | Discharge: 2014-03-06 | Disposition: A | Discharge status: Home / Self Care | Payer: BLUE CROSS/BLUE SHIELD | Attending: Emergency Medicine | Admitting: Emergency Medicine

## 2014-03-06 DIAGNOSIS — M545 Low back pain: Secondary | ICD-10-CM | POA: Insufficient documentation

## 2014-03-06 DIAGNOSIS — I10 Essential (primary) hypertension: Secondary | ICD-10-CM | POA: Diagnosis not present

## 2014-03-06 DIAGNOSIS — Z792 Long term (current) use of antibiotics: Secondary | ICD-10-CM | POA: Insufficient documentation

## 2014-03-06 DIAGNOSIS — Z79899 Other long term (current) drug therapy: Secondary | ICD-10-CM | POA: Insufficient documentation

## 2014-03-06 DIAGNOSIS — M549 Dorsalgia, unspecified: Secondary | ICD-10-CM

## 2014-03-06 MED ORDER — METHOCARBAMOL 500 MG PO TABS: 500.0000 mg | ORAL_TABLET | Freq: Two times a day (BID) | ORAL | Status: DC

## 2014-03-06 MED ORDER — PREDNISONE 20 MG PO TABS: 40.0000 mg | ORAL_TABLET | Freq: Every day | ORAL | Status: DC

## 2014-03-06 MED ORDER — KETOROLAC TROMETHAMINE 60 MG/2ML IM SOLN
60.0000 mg | Freq: Once | INTRAMUSCULAR | Status: AC
  Administered 2014-03-06: 60 mg via INTRAMUSCULAR
  Filled 2014-03-06: qty 2

## 2014-03-06 NOTE — ED Provider Notes (Signed)
CSN: [id number]     Arrival date & time 03/06/14  1132 History   First MD Initiated Contact with Patient 03/06/14 1141     Chief Complaint  Patient presents with  . Back Pain     (Consider location/radiation/quality/duration/timing/severity/associated sxs/prior Treatment) Patient is a 44 y.o. female presenting with back pain. The history is provided by the patient.  Back Pain    This is a 44 year old female with past medical history significant for hypertension, presenting to the ED for back pain. Patient was seen in the ED 3 days ago for the same, start on ibuprofen and Percocet with improvement of her pain but states the Percocet makes her drowsy and she has been unable to work. Patient states pain began in her left lower back with some radiation in her buttocks and posterior thigh. Pain does not descend past the knee. No numbness, paresthesias, or weakness. No loss of bowel or bladder control. Patient started going to the gym in November, but recently took time off because of the new year. States pain began after she started working out again.  No fever, chills, sweats.  VSS on arrival.  Past Medical History  Diagnosis Date  . Hypertension    Past Surgical History  Procedure Laterality Date  . Cholecystectomy    . Cesarean section     Family History  Problem Relation Age of Onset  . Anemia Mother   . Anemia Paternal Grandfather    History  Substance Use Topics  . Smoking status: Never Smoker   . Smokeless tobacco: Never Used  . Alcohol Use: No   OB History    No data available     Review of Systems  Musculoskeletal: Positive for back pain.  All other systems reviewed and are negative.     Allergies  Augmentin  Home Medications   Prior to Admission medications   Medication Sig Start Date End Date Taking? Authorizing Provider  cefdinir (OMNICEF) 300 MG capsule Take 1 capsule (300 mg total) by mouth 2 (two) times daily. X 7 days 01/16/14   Audelia Hives Presson,  PA  ibuprofen (ADVIL,MOTRIN) 200 MG tablet Take 200 mg by mouth every 6 (six) hours as needed.    Historical Provider, MD  ipratropium (ATROVENT) 0.06 % nasal spray Place 2 sprays into both nostrils 4 (four) times daily. For nasal congestion 01/16/14   Audelia Hives Presson, PA  lisinopril-hydrochlorothiazide (PRINZIDE,ZESTORETIC) 20-12.5 MG per tablet Take 1 tablet by mouth daily.    Historical Provider, MD  loratadine (CLARITIN) 10 MG tablet Take 10 mg by mouth daily.    Historical Provider, MD  oxyCODONE-acetaminophen (PERCOCET/ROXICET) 5-325 MG per tablet 1 to 2 tabs PO q6hrs  PRN for pain 03/03/14   Elmyra Ricks Pisciotta, PA-C   BP 174/71 mmHg  Pulse 98  Temp(Src) 98.1 F (36.7 C) (Oral)  Resp 20  SpO2 99%  LMP 01/31/2014 (Approximate)   Physical Exam  Constitutional: She is oriented to person, place, and time. She appears well-developed and well-nourished.  HENT:  Head: Normocephalic and atraumatic.  Mouth/Throat: Oropharynx is clear and moist.  Eyes: Conjunctivae and EOM are normal. Pupils are equal, round, and reactive to light.  Neck: Normal range of motion. Neck supple.  Cardiovascular: Normal rate, regular rhythm and normal heart sounds.   Pulmonary/Chest: Effort normal and breath sounds normal.  Musculoskeletal: Normal range of motion.  Tenderness of left SI joint; no midline step-off or deformities; full ROM of LS maintained but with some pain, mostly  with bending forward; + SLR on left; normal strength and sensation of BLE  Neurological: She is alert and oriented to person, place, and time.  Skin: Skin is warm and dry.  Psychiatric: She has a normal mood and affect.  Nursing note and vitals reviewed.   ED Course  Procedures (including critical care time) Labs Review Labs Reviewed - No data to display  Imaging Review No results found.   EKG Interpretation None      MDM   Final diagnoses:  Back pain, unspecified location   44 year old female with back pain,  presentation and physical exam findings consistent with likely sciatica/lumbar radiculopathy. Back pain today without any red flag symptoms or focal neurologic deficits. Patient has been unable tolerate Percocet due to drowsiness.  Will change regimen, add prednisone and muscle relaxer.  Encouraged to FU with PCP if no imrprovement in the next few days.  Discussed plan with patient, he/she acknowledged understanding and agreed with plan of care.  Return precautions given for new or worsening symptoms.  Larene Pickett, PA-C 03/06/14 [place], MD 03/06/14 [phone]

## 2014-03-06 NOTE — ED Notes (Signed)
Pt states she is taking ibuprofen and percocet for pain.  Cannot take percocet d/t job.  When pain meds wears off it gets too bad to function.

## 2014-03-06 NOTE — Discharge Instructions (Signed)
Take the prescribed medication as directed. °Follow-up with your primary care physician. °Return to the ED for new or worsening symptoms. ° °

## 2014-03-09 ENCOUNTER — Other Ambulatory Visit: Payer: Self-pay | Admitting: Family Medicine

## 2014-03-09 ENCOUNTER — Ambulatory Visit
Admission: RE | Admit: 2014-03-09 | Discharge: 2014-03-09 | Disposition: A | Discharge status: Home / Self Care | Payer: BLUE CROSS/BLUE SHIELD | Source: Ambulatory Visit | Attending: Family Medicine | Admitting: Family Medicine

## 2014-03-09 DIAGNOSIS — M545 Low back pain: Secondary | ICD-10-CM

## 2014-03-22 ENCOUNTER — Ambulatory Visit: Payer: BLUE CROSS/BLUE SHIELD | Admitting: Physical Therapy

## 2014-03-24 ENCOUNTER — Encounter (HOSPITAL_COMMUNITY): Payer: Self-pay | Admitting: *Deleted

## 2014-03-24 ENCOUNTER — Emergency Department (HOSPITAL_COMMUNITY)
Admission: EM | Admit: 2014-03-24 | Discharge: 2014-03-24 | Disposition: A | Discharge status: Home / Self Care | Payer: BLUE CROSS/BLUE SHIELD | Attending: Emergency Medicine | Admitting: Emergency Medicine

## 2014-03-24 DIAGNOSIS — T7840XA Allergy, unspecified, initial encounter: Secondary | ICD-10-CM | POA: Insufficient documentation

## 2014-03-24 DIAGNOSIS — I10 Essential (primary) hypertension: Secondary | ICD-10-CM | POA: Insufficient documentation

## 2014-03-24 DIAGNOSIS — R0602 Shortness of breath: Secondary | ICD-10-CM | POA: Diagnosis present

## 2014-03-24 DIAGNOSIS — Y9289 Other specified places as the place of occurrence of the external cause: Secondary | ICD-10-CM | POA: Insufficient documentation

## 2014-03-24 DIAGNOSIS — Y998 Other external cause status: Secondary | ICD-10-CM | POA: Insufficient documentation

## 2014-03-24 DIAGNOSIS — X58XXXA Exposure to other specified factors, initial encounter: Secondary | ICD-10-CM | POA: Insufficient documentation

## 2014-03-24 DIAGNOSIS — Y9389 Activity, other specified: Secondary | ICD-10-CM | POA: Diagnosis not present

## 2014-03-24 DIAGNOSIS — Z79899 Other long term (current) drug therapy: Secondary | ICD-10-CM | POA: Insufficient documentation

## 2014-03-24 HISTORY — DX: Iron deficiency anemia secondary to blood loss (chronic): D50.0

## 2014-03-24 LAB — I-STAT TROPONIN, ED: Troponin i, poc: 0 ng/mL (ref 0.00–0.08)

## 2014-03-24 MED ORDER — CLINDAMYCIN HCL 150 MG PO CAPS: 450.0000 mg | ORAL_CAPSULE | Freq: Three times a day (TID) | ORAL | Status: DC

## 2014-03-24 MED ORDER — DEXAMETHASONE SODIUM PHOSPHATE 10 MG/ML IJ SOLN
10.0000 mg | Freq: Once | INTRAMUSCULAR | Status: AC
  Administered 2014-03-24: 10 mg via INTRAVENOUS
  Filled 2014-03-24: qty 1

## 2014-03-24 MED ORDER — DIPHENHYDRAMINE HCL 50 MG/ML IJ SOLN
25.0000 mg | Freq: Once | INTRAMUSCULAR | Status: AC
  Administered 2014-03-24: 25 mg via INTRAVENOUS
  Filled 2014-03-24: qty 1

## 2014-03-24 MED ORDER — FAMOTIDINE IN NACL 20-0.9 MG/50ML-% IV SOLN
20.0000 mg | Freq: Once | INTRAVENOUS | Status: AC
  Administered 2014-03-24: 20 mg via INTRAVENOUS
  Filled 2014-03-24: qty 50

## 2014-03-24 NOTE — ED Provider Notes (Signed)
CSN: [id number]     Arrival date & time 03/24/14  1518 History   First MD Initiated Contact with Patient 03/24/14 1522     No chief complaint on file.    (Consider location/radiation/quality/duration/timing/severity/associated sxs/prior Treatment) HPI Comments: Patient presents today with a chief complaint of SOB.  She reports that she started taking PCN yesterday for dental pain.  She reports that yesterday she noticed a pruritic rash on her arms.  Then this morning when she woke up she had a scratchy feeling in her throat, swelling of her face and upper lip, shortness of breath, and a heavy feeling in her chest.  She thought she was having an allergic reaction and took two oral Benadryl, which mildly improved her symptoms.  She reports that she has had an allergic reaction to Augmentin in the past, but does not remember taking PCN in the past.  She denies dizziness, syncope,,cough, fever, chills nausea, vomiting, or abdominal pain.    The history is provided by the patient.    Past Medical History  Diagnosis Date  . Hypertension    Past Surgical History  Procedure Laterality Date  . Cholecystectomy    . Cesarean section     Family History  Problem Relation Age of Onset  . Anemia Mother   . Anemia Paternal Grandfather    History  Substance Use Topics  . Smoking status: Never Smoker   . Smokeless tobacco: Never Used  . Alcohol Use: No   OB History    No data available     Review of Systems  All other systems reviewed and are negative.     Allergies  Augmentin  Home Medications   Prior to Admission medications   Medication Sig Start Date End Date Taking? Authorizing Provider  cefdinir (OMNICEF) 300 MG capsule Take 1 capsule (300 mg total) by mouth 2 (two) times daily. X 7 days 01/16/14   Audelia Hives Presson, PA  ibuprofen (ADVIL,MOTRIN) 200 MG tablet Take 200 mg by mouth every 6 (six) hours as needed.    Historical Provider, MD  ipratropium (ATROVENT) 0.06 %  nasal spray Place 2 sprays into both nostrils 4 (four) times daily. For nasal congestion 01/16/14   Audelia Hives Presson, PA  lisinopril-hydrochlorothiazide (PRINZIDE,ZESTORETIC) 20-12.5 MG per tablet Take 1 tablet by mouth daily.    Historical Provider, MD  loratadine (CLARITIN) 10 MG tablet Take 10 mg by mouth daily.    Historical Provider, MD  methocarbamol (ROBAXIN) 500 MG tablet Take 1 tablet (500 mg total) by mouth 2 (two) times daily. 03/06/14   Larene Pickett, PA-C  oxyCODONE-acetaminophen (PERCOCET/ROXICET) 5-325 MG per tablet 1 to 2 tabs PO q6hrs  PRN for pain 03/03/14   Elmyra Ricks Pisciotta, PA-C  predniSONE (DELTASONE) 20 MG tablet Take 2 tablets (40 mg total) by mouth daily. Take 40 mg by mouth daily for 3 days, then 20mg  by mouth daily for 3 days, then 10mg  daily for 3 days 03/06/14   Larene Pickett, PA-C   BP 142/88 mmHg  Pulse 87  Temp(Src) 98 F (36.7 C) (Oral)  Resp 18  SpO2 100%  LMP 03/19/2014 Physical Exam  Constitutional: She appears well-developed and well-nourished.  HENT:  Head: Normocephalic and atraumatic.  Mouth/Throat: Oropharynx is clear and moist. No trismus in the jaw.  Airway is widely patent.  No swelling of oropharynx No obvious swelling of the lips, tongue, or the face  Neck: Normal range of motion. Neck supple.  Cardiovascular: Normal rate,  regular rhythm and normal heart sounds.   Pulmonary/Chest: Effort normal and breath sounds normal. No stridor. No respiratory distress. She has no wheezes. She has no rales. She exhibits no tenderness.  Abdominal: Soft. Bowel sounds are normal. She exhibits no distension and no mass. There is no tenderness. There is no rebound and no guarding.  Musculoskeletal: Normal range of motion.  Neurological: She is alert.  Skin: Skin is warm and dry. Rash noted. She is not diaphoretic.  Small erythematous pruritic papules on the left forearm.    Psychiatric: She has a normal mood and affect.  Nursing note and vitals  reviewed.   ED Course  Procedures (including critical care time) Chloride, ED    Imaging Review No results found.   EKG Interpretation   Date/Time:  Thursday March 24 2014 15:32:51 EST Ventricular Rate:  78 PR Interval:  152 QRS Duration: 83 QT Interval:  374 QTC Calculation: 426 R Axis:   41 Text Interpretation:  Sinus rhythm Borderline repolarization abnormality  Baseline wander in lead(s) V1 No significant change since last tracing  Confirmed by Maryan Rued  MD, Loree Fee ([id number]) on 03/24/2014 6:17:22 PM     6:13 PM Reassessed patient.  She reports that she no longer has any heaviness in her chest or SOB.  She states that she is feeling like she is at baseline. MDM   Final diagnoses:  None   Patient presents today with SOB, rash, and swelling of her upper lip.  She started taking PCN yesterday for dental pain and reports onset of rash yesterday and onset of SOB and swelling of the face and upper lip this morning.  She is also stating that she has a heavy feeling in her chest that has been present since this morning.  Patient given Decadron, Benadryl, and Pepcid in the ED with complete resolution of symptoms.  Troponin negative.  VSS.  No syncope.  No nausea or vomiting.  No wheezing.  No obvious swelling of face, tongue, or lips.  Airway widely patent.  Feel that the patient is stable for discharge.  Patient instructed to stop the PCN and given Rx for Clindamycin.  Return precautions given.     Hyman Bible, PA-C 03/27/14 1733  Blanchie Dessert, MD 03/28/14 [phone]

## 2014-03-24 NOTE — ED Notes (Signed)
Patient went to see her PCP day before yesterday with c/o weakness and mouth pain.  Patient does not know results of blood work.  Patient received rx for penicllin.  Patient began taking PCN yesterday and this morning woke up with facial swelling and SOB.  Patient had already taken her morning dose of PCN and was instructed to stop taking the medication and come to ED.  Patient's lung sounds are currently clear with no wheezing or stridor noted.

## 2014-03-24 NOTE — ED Notes (Signed)
Patient states she is feeling better and is breathing easier.

## 2014-03-24 NOTE — ED Notes (Signed)
PA Heather at bedside. 

## 2014-03-24 NOTE — ED Notes (Signed)
Patient presents with shortness of breath and facial edema that began earlier today.  Patient was taking penicillin for dental infection since yesterday. When she noted the swelling around her face and the shortness of breath, she called her PCP and was directed to ED.   On exam, patient's lung sounds are clear with no wheezing, stridor or crackles noted. Heart sounds, S1, S2, no murmur. +2 radial and pedal pulses palpated with no pretibial edema noted. Bowel sounds present and patient's abdomen is soft and non-tender to palpation.

## 2014-03-24 NOTE — Discharge Instructions (Signed)
Stop taking your Penicillin.  Instead start taking the clindamycin.    Read instructions below to learn more about your diagnosis and for reasons to return to the ED. Followup with your doctor if symptoms are not improving after 3-4 days.  If you do not have a doctor use the resource guide listed below to help he find one. You may return to the emergency department if symptoms worsen, become progressive, or become more concerning.  Allergic Reaction  There are many allergens around [place]. It may be difficult to know what caused your reaction. You may follow up with an allergy specialist for further testing to learn more about your specific allergies.   TREATMENT AND HOME CARE INSTRUCTIONS  If hives or rash are present:  Use an over-the-counter antihistamine (Benadryl or Zyrtec) for hives and itching as needed. Do not drive or drink alcohol until medications used to treat the reaction have worn off. Antihistamines tend to make people sleepy.  Apply cold cloths (compresses) to the skin or take baths in cool water. This will help itching. Avoid hot baths or showers. Heat will make a rash and itching worse.  See Your Primary Care Doctor if:  Your allergies are becoming progressively more troublesome.  You suspect a food allergy. Symptoms generally happen within 30 minutes of eating a food.  Your symptoms have not gone away within 2 days.  SEEK IMMEDIATE MEDICAL CARE IF:  You develop difficulty breathing or wheezing, or have a tight feeling in your chest or throat (feeling like your throat is closing) You develop swollen lips or tongue You develop hives on your chest, neck or face. You are unable to swallow fluids or salvia secretions.   A severe reaction with any of the above problems should be considered life-threatening. If you suddenly develop difficulty breathing call for local emergency medical help. THIS IS AN EMERGENCY.

## 2014-04-04 ENCOUNTER — Ambulatory Visit: Payer: BLUE CROSS/BLUE SHIELD | Attending: Family Medicine

## 2014-04-04 DIAGNOSIS — M545 Low back pain: Secondary | ICD-10-CM | POA: Diagnosis not present

## 2014-04-08 ENCOUNTER — Ambulatory Visit: Payer: BLUE CROSS/BLUE SHIELD | Admitting: Physical Therapy

## 2014-04-08 DIAGNOSIS — M545 Low back pain: Secondary | ICD-10-CM | POA: Diagnosis not present

## 2014-04-15 ENCOUNTER — Encounter: Payer: Self-pay | Admitting: Physical Therapy

## 2014-04-15 ENCOUNTER — Ambulatory Visit: Payer: BLUE CROSS/BLUE SHIELD | Admitting: Physical Therapy

## 2014-04-15 DIAGNOSIS — M545 Low back pain, unspecified: Secondary | ICD-10-CM

## 2014-04-15 DIAGNOSIS — R6889 Other general symptoms and signs: Secondary | ICD-10-CM

## 2014-04-15 NOTE — Patient Instructions (Signed)
Posture Tips DO: - stand tall and erect - keep chin tucked in - keep head and shoulders in alignment - check posture regularly in mirror or large window - pull head back against headrest in car seat;  Change your position often.  Sit with lumbar support. DON'T: - slouch or slump while watching TV or reading - sit, stand or lie in one position  for too long;  Sitting is especially hard on the spine so if you sit at a desk/use the computer, then stand up often!   Copyright  VHI. All rights reserved.  Posture - Standing   Good posture is important. Avoid slouching and forward head thrust. Maintain curve in low back and align ears over shoul- ders, hips over ankles.  Pull your belly button in toward your back bone.   Copyright  VHI. All rights reserved.  Posture - Sitting   Sit upright, head facing forward. Try using a roll to support lower back. Keep shoulders relaxed, and avoid rounded back. Keep hips level with knees. Avoid crossing legs for long periods.   Copyright  VHI. All rights reserved.  Flexors, Supine Bridge   Lie supine, feet shoulder-width apart. Lift hips toward ceiling. Hold ___ seconds. Repeat ___ times per session. Do ___ sessions per day.  Hip Flexor Stretch   Lying on back near edge of bed, bend one leg, foot flat. Hang other leg over edge, relaxed, thigh resting entirely on bed for ____ minutes. Repeat ____ times. Do ____ sessions per day. Advanced Exercise: Bend knee back keeping thigh in contact with bed.  [email or web address].exer.[email or web address]   HIP: Flexors - Supine   Lie on edge of surface. Place leg off the surface, allow knee to bend. Bring other knee toward chest. Hold ___ seconds. ___ reps per set, ___ sets per day, ___ days per week Rest lowered foot on stool.  BACK: Hip Flexor Stretch   Interlace fingers on top of right knee. Shift weight forward. Continue breathing normally and hold position for ___ breaths. Repeat on other leg. Alternate sides ___ times.  Do ___ times per day.  Quads / HF, Standing   Stand, holding onto chair and grasping one foot with other-side hand. Pull heel toward buttock until stretch is felt in front of thigh. Hold ___ seconds.  Repeat ___ times per session. Do ___ sessions per day.  Copyright  VHI. All rights reserved.

## 2014-04-15 NOTE — Therapy (Signed)
[facility] [address] [place], [place], [place], [address] Phone: [phone]   Fax:  [phone]  Physical Therapy Treatment  Patient Details  Name: Cynthia Vazquez MRN: [id number] Date of Birth: [birth year] Referring Provider:  [name], MD  Encounter Date: 04/15/2014      PT End of Session - 04/15/14 0837    Visit Number 3   Number of Visits 16   Date for PT Re-Evaluation 05/27/14   PT Start Time 0805   PT Stop Time 0845   PT Time Calculation (min) 40 min   Activity Tolerance Patient tolerated treatment well   Behavior During Therapy [facility] for tasks assessed/performed      Past Medical History  Diagnosis Date  . Hypertension   . Iron deficiency anemia due to chronic blood loss     menstrual cycles    Past Surgical History  Procedure Laterality Date  . Cholecystectomy    . Cesarean section      LMP 03/19/2014  Visit Diagnosis:  Bilateral low back pain without sciatica  Activity intolerance      Subjective Assessment - 04/15/14 0813    Symptoms Had iron infusion so she reports feeling great this AM   Currently in Pain? No/denies   Multiple Pain Sites No          [facility] PT Assessment - 04/15/14 0001    Assessment   Medical Diagnosis Low back pain   Precautions   Precautions None   Balance Screen   Has the patient fallen in the past 6 months No   Has the patient had a decrease in activity level because of a fear of falling?  No   Is the patient reluctant to leave their home because of a fear of falling?  No   Observation/Other Assessments   Focus on Therapeutic Outcomes (FOTO)  37% limited   Posture/Postural Control   Posture Comments Patient is overweight  Reduced lumbar lordosis   AROM   Overall AROM  --  Lumbar AROm with full with SI joint pain at end range flexin   PROM   Overall PROM  Other (comment)  Bilateral hip flexibility limited by 25-50%.   Strength   Overall Strength --   4+/5 to 5/5 bilateral LE strength   Flexibility   Soft Tissue Assessment /Muscle Length yes                  [facility] Adult PT Treatment/Exercise - 04/15/14 0001    Exercises   Exercises Lumbar   Lumbar Exercises: Stretches   Lower Trunk Rotation 3 reps;20 seconds   Hip Flexor Stretch 2 reps;30 seconds   Hip Flexor Stretch Limitations Drop leg off table method   Lumbar Exercises: Aerobic   Stationary Bike Level 0 on Lifecycle x8 min   Lumbar Exercises: Standing   Other Standing Lumbar Exercises weight shifting for TA and multifidus on mini tramp 1 min each direction   Lumbar Exercises: Supine   Ab Set 5 reps;5 seconds   Modalities   Modalities --                PT Education - 04/15/14 0834    Education provided Yes   Education Details posture at work, not sitting for prolonged times, HEP additions of hip flexor and ab setting exercises   Person(s) Educated Patient   Methods Explanation;Demonstration;Verbal cues;Handout   Comprehension Verbalized understanding;Returned demonstration          PT Short  Term Goals - 04/15/14 0847    PT SHORT TERM GOAL #1   Title Independent in initial HEP   Time 4   Period Weeks   Status Achieved   PT SHORT TERM GOAL #2   Title Reports 30% reduction in LBP with sitting at work   Time 4   Period Weeks   Status On-going  Just second week of therapy and reporting she is starting to feel better for longer.           PT Long Term Goals - 04/15/14 0849    PT LONG TERM GOAL #1   Title Demonstrate understanding of posture and body mechanic principles.   Time 8   Period Weeks   Status On-going  Started instruction today regarding sitting posture and posture in general.   PT LONG TERM GOAL #2   Title Independent with advanced HEP.   Time 8   Period Weeks   Status On-going  Only second week of therapy.   PT LONG TERM GOAL #3   Title Reduce FOTO to < or = to 23% limitation.   Time 8   Period Weeks   Status  On-going   PT LONG TERM GOAL #4   Title Return to regular exercise routine without limitations.   Time 8   Period Weeks   Status On-going  Currently working towards adding in some flat walking.   PT LONG TERM GOAL #5   Title Report a 70% reduction in LBP with sitting at work.    Time 8   Period Weeks   Status On-going  Just now seeing improvement.               Plan - 04/15/14 0907    PT Frequency 2x / week   PT Duration 8 weeks   PT Treatment/Interventions Cryotherapy;Electrical Stimulation;Ultrasound;Moist Heat;Manual techniques;Gait training;Therapeutic exercise;Patient/family education        Problem List Patient Active Problem List   Diagnosis Date Noted  . Headache 09/16/2013  . Thalassemia trait 08/19/2013  . Iron deficiency anemia due to chronic blood loss 07/14/2013  . Thrombocytosis 07/14/2013  . Menorrhagia 07/14/2013    Laterra Lubinski, PTA 04/15/2014, 9:25 AM  [facility] [address] [place], [place], [place], [address] Phone: [phone]   Fax:  [phone]

## 2014-04-18 ENCOUNTER — Encounter: Payer: BLUE CROSS/BLUE SHIELD | Admitting: Physical Therapy

## 2014-04-20 ENCOUNTER — Ambulatory Visit: Payer: BLUE CROSS/BLUE SHIELD | Admitting: Physical Therapy

## 2014-04-20 ENCOUNTER — Encounter: Payer: Self-pay | Admitting: Physical Therapy

## 2014-04-20 DIAGNOSIS — M545 Low back pain, unspecified: Secondary | ICD-10-CM

## 2014-04-20 DIAGNOSIS — R6889 Other general symptoms and signs: Secondary | ICD-10-CM

## 2014-04-20 NOTE — Therapy (Signed)
[facility] [address] [place], [place], [address] Phone: [phone]   Fax:  [phone]  Physical Therapy Treatment  Patient Details  Name: Cynthia Vazquez MRN: [id number] Date of Birth: 03/16/1970 Referring Provider:  [name], MD  Encounter Date: 04/20/2014      PT End of Session - 04/20/14 0830    Visit Number 4   Number of Visits 16   Date for PT Re-Evaluation 05/27/14   PT Start Time 0800   PT Stop Time 0845   PT Time Calculation (min) 45 min   Activity Tolerance Patient tolerated treatment well   Behavior During Therapy [facility] for tasks assessed/performed      Past Medical History  Diagnosis Date  . Hypertension   . Iron deficiency anemia due to chronic blood loss     menstrual cycles    Past Surgical History  Procedure Laterality Date  . Cholecystectomy    . Cesarean section      LMP 03/19/2014  Visit Diagnosis:  Bilateral low back pain without sciatica  Activity intolerance      Subjective Assessment - 04/20/14 0802    Symptoms Patinet saw MD regarding having bariatric sx. Looks like she has been approved.   Currently in Pain? Yes   Pain Score 5    Pain Location Back   Pain Orientation Left   Pain Descriptors / Indicators Aching;Discomfort   Aggravating Factors  Sitting too long   Pain Relieving Factors Stretching   Multiple Pain Sites No                    [facility] Adult PT Treatment/Exercise - 04/20/14 0001    Lumbar Exercises: Stretches   Active Hamstring Stretch 3 reps;30 seconds   Lower Trunk Rotation 3 reps;20 seconds   Hip Flexor Stretch Limitations Bil 2x 30sec   Lumbar Exercises: Aerobic   Stationary Bike Level 1 x 10 min   Lumbar Exercises: Standing   Other Standing Lumbar Exercises weight shifting for TA and multifidus on mini tramp 1 min each direction   Lumbar Exercises: Supine   Ab Set 10 reps;5 seconds                PT Education -  04/20/14 0825    Education provided Yes   Education Details Advanced HEP to include TA contractions with leg movements.   Person(s) Educated Patient   Methods Explanation;Demonstration;Handout   Comprehension Verbalized understanding;Returned demonstration          PT Short Term Goals - 04/20/14 [phone]    PT SHORT TERM GOAL #1   Title Independent in initial HEP   Period Weeks   Status Achieved   PT SHORT TERM GOAL #2   Title Reports 30% reduction in LBP with sitting at work   Time 4   Period Weeks   Status Achieved  50% with her standing more often           PT Long Term Goals - 04/20/14 0834    PT LONG TERM GOAL #1   Title Demonstrate understanding of posture and Arts development officer principles.   Time 8   Period Weeks   Status On-going   PT LONG TERM GOAL #2   Title Independent with advanced HEP.   Time 8   Period Weeks   Status On-going   PT LONG TERM GOAL #3   Title Reduce FOTO to < or = to 23% limitation.   Time 8  Period Weeks   Status On-going   PT LONG TERM GOAL #4   Title Return to regular exercise routine without limitations.   Time 8   Period Weeks   Status On-going   PT LONG TERM GOAL #5   Title Report a 70% reduction in LBP with sitting at work.    Period Weeks   Status On-going  50%               Plan - 04/20/14 0830    Clinical Impression Statement Patient more efficient in contracting her core today. Performed leg movements well.    Pt will benefit from skilled therapeutic intervention in order to improve on the following deficits Decreased strength;Decreased mobility;Improper body mechanics   Rehab Potential Good   PT Frequency 2x / week   PT Duration 8 weeks   PT Treatment/Interventions Cryotherapy;Electrical Stimulation;Ultrasound;Moist Heat;Manual techniques;Gait training;Therapeutic exercise;Patient/family education   PT Next Visit Plan review HEP, continue with core strength and see how pt tolerated ham stretch.    Consulted and  Agree with Plan of Care Patient        Problem List Patient Active Problem List   Diagnosis Date Noted  . Headache 09/16/2013  . Thalassemia trait 08/19/2013  . Iron deficiency anemia due to chronic blood loss 07/14/2013  . Thrombocytosis 07/14/2013  . Menorrhagia 07/14/2013    COCHRAN,JENNIFER ,PTA  04/20/2014, 8:37 AM  [facility] [address], [place], [place], [address] Phone: [phone]   Fax:  [phone]

## 2014-04-20 NOTE — Patient Instructions (Signed)

## 2014-04-25 ENCOUNTER — Encounter: Payer: BLUE CROSS/BLUE SHIELD | Admitting: Physical Therapy

## 2014-04-27 ENCOUNTER — Encounter: Payer: Self-pay | Admitting: Physical Therapy

## 2014-04-27 ENCOUNTER — Ambulatory Visit: Payer: BLUE CROSS/BLUE SHIELD | Attending: Family Medicine | Admitting: Physical Therapy

## 2014-04-27 DIAGNOSIS — M545 Low back pain, unspecified: Secondary | ICD-10-CM

## 2014-04-27 DIAGNOSIS — R6889 Other general symptoms and signs: Secondary | ICD-10-CM

## 2014-04-27 NOTE — Patient Instructions (Signed)
Posture Tips DO: - stand tall and erect - keep chin tucked in - keep head and shoulders in alignment - check posture regularly in mirror or large window - pull head back against headrest in car seat;  Change your position often.  Sit with lumbar support. DON'T: - slouch or slump while watching TV or reading - sit, stand or lie in one position  for too long;  Sitting is especially hard on the spine so if you sit at a desk/use the computer, then stand up often!   Copyright  VHI. All rights reserved.  Posture - Standing   Good posture is important. Avoid slouching and forward head thrust. Maintain curve in low back and align ears over shoul- ders, hips over ankles.  Pull your belly button in toward your back bone.   Copyright  VHI. All rights reserved.  Posture - Sitting   Sit upright, head facing forward. Try using a roll to support lower back. Keep shoulders relaxed, and avoid rounded back. Keep hips level with knees. Avoid crossing legs for long periods.   Copyright  VHI. All rights reserved.  Sleeping on Back  Place pillow under knees. A pillow with cervical support and a roll around waist are also helpful. Copyright  VHI. All rights reserved.  Sleeping on Side Place pillow between knees. Use cervical support under neck and a roll around waist as needed. Copyright  VHI. All rights reserved.   Sleeping on Stomach   If this is the only desirable sleeping position, place pillow under lower legs, and under stomach or chest as needed.  Posture - Sitting   Sit upright, head facing forward. Try using a roll to support lower back. Keep shoulders relaxed, and avoid rounded back. Keep hips level with knees. Avoid crossing legs for long periods. Stand to Sit / Sit to Stand   To sit: Bend knees to lower self onto front edge of chair, then scoot back on seat. To stand: Reverse sequence by placing one foot forward, and scoot to front of seat. Use rocking motion to stand up.   Work  Height and Reach  Ideal work height is no more than 2 to 4 inches below elbow level when standing, and at elbow level when sitting. Reaching should be limited to arm's length, with elbows slightly bent.  Bending  Bend at hips and knees, not back. Keep feet shoulder-width apart.    Posture - Standing   Good posture is important. Avoid slouching and forward head thrust. Maintain curve in low back and align ears over shoul- ders, hips over ankles.  Alternating Positions   Alternate tasks and change positions frequently to reduce fatigue and muscle tension. Take rest breaks. Computer Work   Position work to Programmer, multimedia. Use proper work and seat height. Keep shoulders back and down, wrists straight, and elbows at right angles. Use chair that provides full back support. Add footrest and lumbar roll as needed.  Getting Into / Out of Car  Lower self onto seat, scoot back, then bring in one leg at a time. Reverse sequence to get out.  Dressing  Lie on back to pull socks or slacks over feet, or sit and bend leg while keeping back straight.    Housework - Sink  Place one foot on ledge of cabinet under sink when standing at sink for prolonged periods.   Pushing / Pulling  Pushing is preferable to pulling. Keep back in proper alignment, and use leg muscles to do the  do the work.  Deep Squat   Squat and lift with both arms held against upper trunk. Tighten stomach muscles without holding breath. Use smooth movements to avoid jerking.  Avoid Twisting   Avoid twisting or bending back. Pivot around using foot movements, and bend at knees if needed when reaching for articles.  Carrying Luggage   Distribute weight evenly on both sides. Use a cart whenever possible. Do not twist trunk. Move body as a unit.   Lifting Principles .Maintain proper posture and head alignment. .Slide object as close as possible before lifting. .Move obstacles out of the way. .Test before  lifting; ask for help if too heavy. .Tighten stomach muscles without holding breath. .Use smooth movements; do not jerk. .Use legs to do the work, and pivot with feet. .Distribute the work load symmetrically and close to the center of trunk. .Push instead of pull whenever possible.   Ask For Help   Ask for help and delegate to others when possible. Coordinate your movements when lifting together, and maintain the low back curve.  Log Roll   Lying on back, bend left knee and place left arm across chest. Roll all in one movement to the right. Reverse to roll to the left. Always move as one unit. Housework - Sweeping  Use long-handled equipment to avoid stooping.   Housework - Wiping  Position yourself as close as possible to reach work surface. Avoid straining your back.  Laundry - Unloading Wash   To unload small items at bottom of washer, lift leg opposite to arm being used to reach.  Gardening - Raking  Move close to area to be raked. Use arm movements to do the work. Keep back straight and avoid twisting.     Cart  When reaching into cart with one arm, lift opposite leg to keep back straight.   Getting Into / Out of Bed  Lower self to lie down on one side by raising legs and lowering head at the same time. Use arms to assist moving without twisting. Bend both knees to roll onto back if desired. To sit up, start from lying on side, and use same move-ments in reverse. Housework - Vacuuming  Hold the vacuum with arm held at side. Step back and forth to move it, keeping head up. Avoid twisting.   Laundry - Loading Wash  Position laundry basket so that bending and twisting can be avoided.   Laundry - Unloading Dryer  Squat down to reach into clothes dryer or use a reacher.  Gardening - Weeding / Planting  Squat or Kneel. Knee pads may be helpful.                    

## 2014-04-27 NOTE — Therapy (Signed)
[facility] [address] [place], [place], [address] Phone: [phone]   Fax:  [phone]  Physical Therapy Treatment  Patient Details  Name: Cynthia Vazquez MRN: [id number] Date of Birth: [birth year] Referring Provider:  [name], MD  Encounter Date: 04/27/2014      PT End of Session - 04/27/14 0829    Visit Number 5   Number of Visits 16   Date for PT Re-Evaluation 05/27/14   PT Start Time 0800   PT Stop Time 0840   PT Time Calculation (min) 40 min   Activity Tolerance Patient tolerated treatment well   Behavior During Therapy [facility] for tasks assessed/performed      Past Medical History  Diagnosis Date  . Hypertension   . Iron deficiency anemia due to chronic blood loss     menstrual cycles    Past Surgical History  Procedure Laterality Date  . Cholecystectomy    . Cesarean section      There were no vitals taken for this visit.  Visit Diagnosis:  Bilateral low back pain without sciatica  Activity intolerance      Subjective Assessment - 04/27/14 0806    Symptoms Iron very low per pt report today. Feeling sluggish and teary throughout tx. Walked yesterday 20 min.    Currently in Pain? Yes   Pain Score 5    Pain Location Back   Pain Orientation Left   Pain Descriptors / Indicators Aching;Discomfort   Aggravating Factors  Doing a lot bending/chores around the house   Pain Relieving Factors Stretching, walking,    Multiple Pain Sites No                    [facility] Adult PT Treatment/Exercise - 04/27/14 0001    Lumbar Exercises: Aerobic   Stationary Bike L1 x 10 min   Lumbar Exercises: Standing   Other Standing Lumbar Exercises weight shifting for TA and multifidus on mini tramp 1 min each direction   Moist Heat Therapy   Number Minutes Moist Heat 30 Minutes  Concurrent with pt education   Moist Heat Location Other (comment)   Electrical Stimulation   Electrical Stimulation  Location --  Lumbar                PT Education - 04/27/14 [phone]    Education provided No   Education Details Posture and body mechanics education   Person(s) Educated Patient   Methods Explanation;Demonstration;Handout   Comprehension Verbalized understanding          PT Short Term Goals - 04/27/14 0810    PT SHORT TERM GOAL #1   Title Independent in initial HEP   Time 4   Period Weeks   Status Achieved   PT SHORT TERM GOAL #2   Title Reports 30% reduction in LBP with sitting at work   Time 4           PT Long Term Goals - 04/27/14 0811    PT LONG TERM GOAL #1   Title Demonstrate understanding of posture and Arts development officer principles.   Time 8   Period Weeks   Status Achieved   PT LONG TERM GOAL #2   Title Independent with advanced HEP.   Time 8   Period Weeks   Status On-going  Continue to work on   [place] #3   Time 8   Period Weeks   Status On-going  will do on  visit 10.   PT LONG TERM GOAL #4   Title Return to regular exercise routine without limitations.   Time 8   Period Weeks   Status On-going  Able to walk on treadmill at gym yesterday: working towards goal.               Plan - 04/27/14 0832    Clinical Impression Statement Patient was educated in posture and lumbar protective body mechanics, held more exercises due to pt not feeling well today.   Pt will benefit from skilled therapeutic intervention in order to improve on the following deficits Decreased strength;Decreased mobility;Improper body mechanics   Rehab Potential Good   Clinical Impairments Affecting Rehab Potential none   PT Frequency 2x / week   PT Duration 8 weeks   PT Treatment/Interventions Cryotherapy;Electrical Stimulation;Ultrasound;Moist Heat;Manual techniques;Gait training;Therapeutic exercise;Patient/family education   PT Next Visit Plan Resume core strength and flexibility exercises. review hamstring stretch.   Consulted and Agree with Plan of  Care Patient        Problem List Patient Active Problem List   Diagnosis Date Noted  . Headache 09/16/2013  . Thalassemia trait 08/19/2013  . Iron deficiency anemia due to chronic blood loss 07/14/2013  . Thrombocytosis 07/14/2013  . Menorrhagia 07/14/2013    Roxanne Orner, PTA 04/27/2014, 8:37 AM  [facility] [address], [place], [place], [address] Phone: [phone]   Fax:  [phone]

## 2014-05-02 ENCOUNTER — Other Ambulatory Visit (HOSPITAL_BASED_OUTPATIENT_CLINIC_OR_DEPARTMENT_OTHER): Payer: BLUE CROSS/BLUE SHIELD

## 2014-05-02 ENCOUNTER — Telehealth: Payer: Self-pay | Admitting: Hematology and Oncology

## 2014-05-02 DIAGNOSIS — D500 Iron deficiency anemia secondary to blood loss (chronic): Secondary | ICD-10-CM

## 2014-05-02 LAB — CBC & DIFF AND RETIC
BASO%: 0.7 % (ref 0.0–2.0)
Basophils Absolute: 0.1 10*3/uL (ref 0.0–0.1)
EOS ABS: 0.2 10*3/uL (ref 0.0–0.5)
EOS%: 2.7 % (ref 0.0–7.0)
HCT: 39.8 % (ref 34.8–46.6)
HEMOGLOBIN: 12.3 g/dL (ref 11.6–15.9)
Immature Retic Fract: 11.4 % — ABNORMAL HIGH (ref 1.60–10.00)
LYMPH%: 40.6 % (ref 14.0–49.7)
MCH: 23.2 pg — ABNORMAL LOW (ref 25.1–34.0)
MCHC: 30.9 g/dL — ABNORMAL LOW (ref 31.5–36.0)
MCV: 75.1 fL — ABNORMAL LOW (ref 79.5–101.0)
MONO#: 0.7 10*3/uL (ref 0.1–0.9)
MONO%: 11.1 % (ref 0.0–14.0)
NEUT%: 44.9 % (ref 38.4–76.8)
NEUTROS ABS: 3 10*3/uL (ref 1.5–6.5)
Platelets: 340 10*3/uL (ref 145–400)
RBC: 5.3 10*6/uL (ref 3.70–5.45)
RDW: 16 % — AB (ref 11.2–14.5)
Retic %: 1.2 % (ref 0.70–2.10)
Retic Ct Abs: 63.6 10*3/uL (ref 33.70–90.70)
WBC: 6.7 10*3/uL (ref 3.9–10.3)
lymph#: 2.7 10*3/uL (ref 0.9–3.3)

## 2014-05-02 LAB — IRON AND TIBC CHCC
%SAT: 16 % — ABNORMAL LOW (ref 21–57)
Iron: 54 ug/dL (ref 41–142)
TIBC: 335 ug/dL (ref 236–444)
UIBC: 281 ug/dL (ref 120–384)

## 2014-05-02 LAB — FERRITIN CHCC: FERRITIN: 31 ng/mL (ref 9–269)

## 2014-05-02 NOTE — Telephone Encounter (Signed)
s.w. pt and advised on March appts....pt ok and aware of d.t

## 2014-05-05 ENCOUNTER — Encounter: Payer: Self-pay | Admitting: Physical Therapy

## 2014-05-05 ENCOUNTER — Ambulatory Visit: Payer: BLUE CROSS/BLUE SHIELD | Admitting: Physical Therapy

## 2014-05-05 DIAGNOSIS — M545 Low back pain, unspecified: Secondary | ICD-10-CM

## 2014-05-05 DIAGNOSIS — R6889 Other general symptoms and signs: Secondary | ICD-10-CM

## 2014-05-05 NOTE — Therapy (Signed)
[facility] [address] [place], [place], [address] Phone: [phone]   Fax:  [phone]  Physical Therapy Treatment  Patient Details  Name: Cynthia Vazquez MRN: [id number] Date of Birth: 29-Oct-1970 Referring Provider:  [name], MD  Encounter Date: 05/05/2014      PT End of Session - 05/05/14 0850    Visit Number 6   Number of Visits 16   Date for PT Re-Evaluation 05/27/14   PT Start Time 0809   PT Stop Time 0845   PT Time Calculation (min) 36 min   Activity Tolerance Patient tolerated treatment well   Behavior During Therapy [facility] for tasks assessed/performed      Past Medical History  Diagnosis Date  . Hypertension   . Iron deficiency anemia due to chronic blood loss     menstrual cycles    Past Surgical History  Procedure Laterality Date  . Cholecystectomy    . Cesarean section      There were no vitals taken for this visit.  Visit Diagnosis:  Bilateral low back pain without sciatica  Activity intolerance      Subjective Assessment - 05/05/14 0812    Symptoms Iron is still very low, On Monday a Iron fusion is scheduled   Currently in Pain? No/denies   Multiple Pain Sites No          [facility] PT Assessment - 05/05/14 0001    Assessment   Medical Diagnosis low back   Precautions   Precautions None   Balance Screen   Has the patient fallen in the past 6 months No   Has the patient had a decrease in activity level because of a fear of falling?  No   Is the patient reluctant to leave their home because of a fear of falling?  No   Observation/Other Assessments   Focus on Therapeutic Outcomes (FOTO)  21% limitations   Posture/Postural Control   Posture Comments Patient is overweight  Pt is planning to have a Bariatric Sleeve    ROM / Strength   AROM / PROM / Strength AROM   AROM   Overall AROM  --  Lumbar AROM WFL, but slow movement   PROM   Overall PROM  Other (comment)   Bilateral hip flexibility limited by 25-45%   Strength   Overall Strength --  4+/5 to 5/5 bilateral LE strength   Flexibility   Soft Tissue Assessment /Muscle Length yes                  [facility] Adult PT Treatment/Exercise - 05/05/14 0001    Lumbar Exercises: Stretches   Active Hamstring Stretch 3 reps;20 seconds  each leg in sitting   Single Knee to Chest Stretch 3 reps;20 seconds  using towel   Lower Trunk Rotation 3 reps;20 seconds   Lumbar Exercises: Aerobic   Stationary Bike L1 x 73mn   Lumbar Exercises: Standing   Other Standing Lumbar Exercises weight shifting for TA and multifidus on mini tramp 1 min each direction                  PT Short Term Goals - 04/27/14 0810    PT SHORT TERM GOAL #1   Title Independent in initial HEP   Time 4   Period Weeks   Status Achieved   PT SHORT TERM GOAL #2   Title Reports 30% reduction in LBP with sitting at work   Time 4  PT Long Term Goals - 05/05/14 0840    PT LONG TERM GOAL #1   Title Demonstrate understanding of posture and body Dealer principles.   Time 8   Status Achieved   PT LONG TERM GOAL #2   Title Independent with advanced HEP.   Time 8   Period Weeks   Status Achieved   PT LONG TERM GOAL #3   Title Reduce FOTO to < or = to 23% limitation.  21%   Time 8   Period Weeks   Status Achieved   PT LONG TERM GOAL #4   Title Return to regular exercise routine without limitations.   Time 8   Period Weeks   Status Achieved   PT LONG TERM GOAL #5   Title Report a 70% reduction in LBP with sitting at work.   reports 95% improvement   Time 8   Period Weeks   Status Achieved               Plan - 05/05/14 0850    Clinical Impression Statement Pt. with good posture awarness and has HEP in place and she started back working out at the gym   Pt will benefit from skilled therapeutic intervention in order to improve on the following deficits Decreased strength;Decreased  mobility;Improper body mechanics   Rehab Potential Good   Clinical Impairments Affecting Rehab Potential none   Consulted and Agree with Plan of Care Patient        Problem List Patient Active Problem List   Diagnosis Date Noted  . Headache 09/16/2013  . Thalassemia trait 08/19/2013  . Iron deficiency anemia due to chronic blood loss 07/14/2013  . Thrombocytosis 07/14/2013  . Menorrhagia 07/14/2013    NAUMANN-HOUEGNIFIO,Vann Okerlund PTA 05/05/2014, 8:56 AM  PHYSICAL THERAPY DISCHARGE SUMMARY  Visits from Start of Care: 6  Current functional level related to goals / functional outcomes: See above for goal assessment.  Pt reports 95% overall improvement and has HEP for continued progress.  Thank you for this referral.   Remaining deficits: Pt denies significant deficits at this time.   Education / Equipment: HEP, Economist education Plan: Patient agrees to discharge.  Patient goals were met. Patient is being discharged due to meeting the stated rehab goals.  ?????   Sigurd Sos, PT 05/05/2014 9:34 AM   [facility] [address], [place], [place], [address] Phone: [phone]   Fax:  [phone]

## 2014-05-09 ENCOUNTER — Ambulatory Visit (HOSPITAL_BASED_OUTPATIENT_CLINIC_OR_DEPARTMENT_OTHER): Payer: BLUE CROSS/BLUE SHIELD | Admitting: Hematology and Oncology

## 2014-05-09 ENCOUNTER — Telehealth: Payer: Self-pay | Admitting: Hematology and Oncology

## 2014-05-09 ENCOUNTER — Ambulatory Visit (HOSPITAL_BASED_OUTPATIENT_CLINIC_OR_DEPARTMENT_OTHER): Payer: BLUE CROSS/BLUE SHIELD

## 2014-05-09 ENCOUNTER — Encounter: Payer: Self-pay | Admitting: Hematology and Oncology

## 2014-05-09 VITALS — BP 142/79 | HR 72 | Temp 98.2°F | Resp 19 | Ht 68.0 in | Wt 363.3 lb

## 2014-05-09 DIAGNOSIS — D500 Iron deficiency anemia secondary to blood loss (chronic): Secondary | ICD-10-CM

## 2014-05-09 DIAGNOSIS — Z6841 Body Mass Index (BMI) 40.0 and over, adult: Secondary | ICD-10-CM

## 2014-05-09 MED ORDER — SODIUM CHLORIDE 0.9 % IV SOLN
Freq: Once | INTRAVENOUS | Status: AC
  Administered 2014-05-09: 11:00:00 via INTRAVENOUS

## 2014-05-09 MED ORDER — SODIUM CHLORIDE 0.9 % IV SOLN
510.0000 mg | Freq: Once | INTRAVENOUS | Status: AC
  Administered 2014-05-09: 510 mg via INTRAVENOUS
  Filled 2014-05-09: qty 17

## 2014-05-09 NOTE — Progress Notes (Signed)
[facility] OFFICE PROGRESS NOTE  Cynthia Coma, MD SUMMARY OF HEMATOLOGIC HISTORY:  She was found to have abnormal CBC from routine blood work. In November 2008, she has anemia with hemoglobin of 9.7, MCV of 58.4 with platelet count elevation at 469. On 07/13/2013, she is still anemic with hemoglobin 9.1, MCV of 60.7 and elevated platelet count of 434. Iron studies performed in her physician's office was very low. She denies recent chest pain on exertion, shortness of breath on minimal exertion, or palpitations. She did complain of profound weakness, dizziness as well as new onset of leg cramps. She had not noticed any recent bleeding such as epistaxis, hematuria or hematochezia. She does complain of excessive menstruation. She has periods 7-10 days every month with significant heavy periods in the first few days of each menstrual cycle. On 07/15/2013 and 09/16/2013, she received intravenous iron.  INTERVAL HISTORY: Cynthia Vazquez 44 y.o. female returns for further follow-up. She denies any pica. Her heavy menstruation has improved. The patient is currently undergoing evaluation for possible barometric surgery with possible sleeve procedure in the summer.  The patient denies any recent signs or symptoms of bleeding such as spontaneous epistaxis, hematuria or hematochezia.  I have reviewed the past medical history, past surgical history, social history and family history with the patient and they are unchanged from previous note.  ALLERGIES:  is allergic to penicillins and augmentin.  MEDICATIONS:  Current Outpatient Prescriptions  Medication Sig Dispense Refill  . lisinopril-hydrochlorothiazide (PRINZIDE,ZESTORETIC) 20-12.5 MG per tablet Take 1 tablet by mouth daily.    Marland Kitchen loratadine (CLARITIN) 10 MG tablet Take 10 mg by mouth daily as needed for allergies.     . tranexamic acid (LYSTEDA) 650 MG TABS tablet Take 1,300 mg by mouth 3 (three) times daily. For five days during  menstrual cycle.    . fluocinonide ointment (LIDEX) 0.05 %   2  . ibuprofen (ADVIL,MOTRIN) 200 MG tablet Take 400 mg by mouth every 6 (six) hours as needed for headache or moderate pain.     . Vitamin D, Ergocalciferol, (DRISDOL) 50000 UNITS CAPS capsule   0   No current facility-administered medications for this visit.   Facility-Administered Medications Ordered in Other Visits  Medication Dose Route Frequency Provider Last Rate Last Dose  . 0.9 %  sodium chloride infusion   Intravenous Once Heath Lark, MD      . ferumoxytol (FERAHEME) 510 mg in sodium chloride 0.9 % 100 mL IVPB  510 mg Intravenous Once Heath Lark, MD         REVIEW OF SYSTEMS:   Constitutional: Denies fevers, chills or night sweats Eyes: Denies blurriness of vision Ears, nose, mouth, throat, and face: Denies mucositis or sore throat Respiratory: Denies cough, dyspnea or wheezes Cardiovascular: Denies palpitation, chest discomfort or lower extremity swelling Gastrointestinal:  Denies nausea, heartburn or change in bowel habits Skin: Denies abnormal skin rashes Lymphatics: Denies new lymphadenopathy or easy bruising Neurological:Denies numbness, tingling or new weaknesses Behavioral/Psych: Mood is stable, no new changes  All other systems were reviewed with the patient and are negative.  PHYSICAL EXAMINATION: ECOG PERFORMANCE STATUS: 0 - Asymptomatic  Filed Vitals:   05/09/14 0958  BP: 142/79  Pulse: 72  Temp: 98.2 F (36.8 C)  Resp: 19   Filed Weights   05/09/14 0958  Weight: 363 lb 4.8 oz (164.792 kg)    GENERAL:alert, no distress and comfortable. She is morbidly obese SKIN: skin color, texture, turgor are normal, no rashes or  significant lesions EYES: normal, Conjunctiva are pink and non-injected, sclera clear Musculoskeletal:no cyanosis of digits and no clubbing  NEURO: alert & oriented x 3 with fluent speech, no focal motor/sensory deficits  LABORATORY DATA:  I have reviewed the data as  listed No results found for this or any previous visit (from the past 48 hour(s)).  Lab Results  Component Value Date   WBC 6.7 05/02/2014   HGB 12.3 05/02/2014   HCT 39.8 05/02/2014   MCV 75.1* 05/02/2014   PLT 340 05/02/2014    ASSESSMENT & PLAN:  Iron deficiency anemia due to chronic blood loss The most likely cause of her anemia is due to chronic blood loss. We discussed some of the risks, benefits, and alternatives of intravenous iron infusions. The patient is symptomatic from anemia and the iron level is critically low. She tolerated oral iron supplement poorly and desires to achieved higher levels of iron faster for adequate hematopoesis. Some of the side-effects to be expected including risks of infusion reactions, phlebitis, headaches, nausea and fatigue.  The patient is willing to proceed. Patient education material was dispensed.  Goal is to keep ferritin level greater than 50 I will see her back again within 6 months to make sure she does not require further intravenous iron infusion.     Morbid obesity There should be no contraindication from a hematology standpoint for her to proceed. The patient is aware that after any form of bariatric surgery, malabsorption could be an issue and she may need more intravenous iron infusion in the future. I plan to repeat her blood work again in 6 months and we will proceed to keep ferritin level over 50 in the future for her.      All questions were answered. The patient knows to call the clinic with any problems, questions or concerns. No barriers to learning was detected.  I spent 15 minutes counseling the patient face to face. The total time spent in the appointment was 20 minutes and more than 50% was on counseling.     [facility], Kaydan Wong, MD 3/14/201610:44 AM

## 2014-05-09 NOTE — Telephone Encounter (Signed)
added appt pt will get sched in chemo °

## 2014-05-09 NOTE — Patient Instructions (Signed)

## 2014-05-09 NOTE — Assessment & Plan Note (Signed)
There should be no contraindication from a hematology standpoint for her to proceed. The patient is aware that after any form of bariatric surgery, malabsorption could be an issue and she may need more intravenous iron infusion in the future. I plan to repeat her blood work again in 6 months and we will proceed to keep ferritin level over 50 in the future for her.

## 2014-05-09 NOTE — Assessment & Plan Note (Signed)
The most likely cause of her anemia is due to chronic blood loss. We discussed some of the risks, benefits, and alternatives of intravenous iron infusions. The patient is symptomatic from anemia and the iron level is critically low. She tolerated oral iron supplement poorly and desires to achieved higher levels of iron faster for adequate hematopoesis. Some of the side-effects to be expected including risks of infusion reactions, phlebitis, headaches, nausea and fatigue.  The patient is willing to proceed. Patient education material was dispensed.  Goal is to keep ferritin level greater than 50 I will see her back again within 6 months to make sure she does not require further intravenous iron infusion.

## 2014-05-14 ENCOUNTER — Emergency Department (HOSPITAL_COMMUNITY)
Admission: EM | Admit: 2014-05-14 | Discharge: 2014-05-14 | Disposition: A | Discharge status: Home / Self Care | Payer: BLUE CROSS/BLUE SHIELD | Attending: Emergency Medicine | Admitting: Emergency Medicine

## 2014-05-14 ENCOUNTER — Emergency Department (HOSPITAL_COMMUNITY): Payer: BLUE CROSS/BLUE SHIELD

## 2014-05-14 DIAGNOSIS — I10 Essential (primary) hypertension: Secondary | ICD-10-CM | POA: Insufficient documentation

## 2014-05-14 DIAGNOSIS — Z79899 Other long term (current) drug therapy: Secondary | ICD-10-CM | POA: Diagnosis not present

## 2014-05-14 DIAGNOSIS — E669 Obesity, unspecified: Secondary | ICD-10-CM | POA: Insufficient documentation

## 2014-05-14 DIAGNOSIS — Z862 Personal history of diseases of the blood and blood-forming organs and certain disorders involving the immune mechanism: Secondary | ICD-10-CM | POA: Diagnosis not present

## 2014-05-14 DIAGNOSIS — R0981 Nasal congestion: Secondary | ICD-10-CM | POA: Diagnosis not present

## 2014-05-14 DIAGNOSIS — Z880 Allergy status to penicillin: Secondary | ICD-10-CM | POA: Diagnosis not present

## 2014-05-14 DIAGNOSIS — J988 Other specified respiratory disorders: Secondary | ICD-10-CM

## 2014-05-14 DIAGNOSIS — J301 Allergic rhinitis due to pollen: Secondary | ICD-10-CM | POA: Diagnosis not present

## 2014-05-14 DIAGNOSIS — J302 Other seasonal allergic rhinitis: Secondary | ICD-10-CM

## 2014-05-14 DIAGNOSIS — R079 Chest pain, unspecified: Secondary | ICD-10-CM | POA: Diagnosis present

## 2014-05-14 LAB — CBC
HCT: 40.5 % (ref 36.0–46.0)
Hemoglobin: 12.3 g/dL (ref 12.0–15.0)
MCH: 23.3 pg — ABNORMAL LOW (ref 26.0–34.0)
MCHC: 30.4 g/dL (ref 30.0–36.0)
MCV: 76.7 fL — ABNORMAL LOW (ref 78.0–100.0)
PLATELETS: 348 10*3/uL (ref 150–400)
RBC: 5.28 MIL/uL — ABNORMAL HIGH (ref 3.87–5.11)
RDW: 16.6 % — ABNORMAL HIGH (ref 11.5–15.5)
WBC: 6.8 10*3/uL (ref 4.0–10.5)

## 2014-05-14 LAB — I-STAT TROPONIN, ED: Troponin i, poc: 0 ng/mL (ref 0.00–0.08)

## 2014-05-14 LAB — BASIC METABOLIC PANEL
Anion gap: 5 (ref 5–15)
BUN: 14 mg/dL (ref 6–23)
CHLORIDE: 107 mmol/L (ref 96–112)
CO2: 25 mmol/L (ref 19–32)
Calcium: 8.6 mg/dL (ref 8.4–10.5)
Creatinine, Ser: 0.82 mg/dL (ref 0.50–1.10)
GFR calc Af Amer: >90 mL/min (ref >90)
GFR calc non Af Amer: 86 mL/min — ABNORMAL LOW (ref >90)
Glucose, Bld: 108 mg/dL — ABNORMAL HIGH (ref 70–99)
Potassium: 3.9 mmol/L (ref 3.5–5.1)
SODIUM: 137 mmol/L (ref 135–145)

## 2014-05-14 LAB — BRAIN NATRIURETIC PEPTIDE: B Natriuretic Peptide: 28.8 pg/mL (ref 0.0–100.0)

## 2014-05-14 MED ORDER — FLUTICASONE PROPIONATE 50 MCG/ACT NA SUSP: 2.0000 | Freq: Every day | NASAL | Status: DC

## 2014-05-14 NOTE — ED Provider Notes (Signed)
CSN: [id number]     Arrival date & time 05/14/14  1421 History   First MD Initiated Contact with Patient 05/14/14 1721     Chief Complaint  Patient presents with  . Chest Pain  . Shortness of Breath  . Cough     (Consider location/radiation/quality/duration/timing/severity/associated sxs/prior Treatment) The history is provided by the patient.  Cynthia Vazquez is a 44 y.o. female hx of HTN here presenting with shortness of breath, cough, congestion. She has been congested for the last several days. She states that she sometimes has more trouble breathing when she exercise or walk up the steps. Denies any chest pain. Denies any shortness of breath at rest and no history of PE or DVT. No hx of CAD. Has hx of seasonal allergies and she gets these symptoms in the spring.    Past Medical History  Diagnosis Date  . Hypertension   . Iron deficiency anemia due to chronic blood loss     menstrual cycles   Past Surgical History  Procedure Laterality Date  . Cholecystectomy    . Cesarean section     Family History  Problem Relation Age of Onset  . Anemia Mother   . Anemia Paternal Grandfather    History  Substance Use Topics  . Smoking status: Never Smoker   . Smokeless tobacco: Never Used  . Alcohol Use: No   OB History    No data available     Review of Systems  Respiratory: Positive for cough and shortness of breath.   Cardiovascular: Positive for chest pain.  All other systems reviewed and are negative.     Allergies  Penicillins and Augmentin  Home Medications   Prior to Admission medications   Medication Sig Start Date End Date Taking? Authorizing Provider  fluocinonide ointment (LIDEX) 2.83 % Apply 1 application topically 2 (two) times daily as needed (scalp).  04/29/14  Yes Historical Provider, MD  ibuprofen (ADVIL,MOTRIN) 200 MG tablet Take 400 mg by mouth every 6 (six) hours as needed for headache or moderate pain.    Yes Historical Provider, MD   lisinopril-hydrochlorothiazide (PRINZIDE,ZESTORETIC) 20-12.5 MG per tablet Take 1 tablet by mouth daily.   Yes Historical Provider, MD  loratadine (CLARITIN) 10 MG tablet Take 10 mg by mouth daily as needed for allergies.    Yes Historical Provider, MD  tranexamic acid (LYSTEDA) 650 MG TABS tablet Take 1,300 mg by mouth 3 (three) times daily. For five days during menstrual cycle.   Yes Historical Provider, MD  Vitamin D, Ergocalciferol, (DRISDOL) 50000 UNITS CAPS capsule Take 50,000 Units by mouth every 7 (seven) days.  03/29/14  Yes Historical Provider, MD   BP 154/72 mmHg  Pulse 82  Temp(Src) 98.4 F (36.9 C) (Oral)  Resp 25  SpO2 99%  LMP 05/08/2014 Physical Exam  Constitutional: She is oriented to person, place, and time.  Obese, NAD   HENT:  Head: Normocephalic.  Mouth/Throat: Oropharynx is clear and moist.  Eyes: Conjunctivae are normal. Pupils are equal, round, and reactive to light.  Neck: Normal range of motion. Neck supple.  Cardiovascular: Normal rate, regular rhythm and normal heart sounds.   Pulmonary/Chest: Effort normal and breath sounds normal. No respiratory distress. She has no wheezes. She has no rales.  Abdominal: Soft. Bowel sounds are normal. She exhibits no distension. There is no tenderness. There is no rebound and no guarding.  Musculoskeletal: Normal range of motion. She exhibits no edema or tenderness.  Neurological: She is alert and oriented  to person, place, and time. No cranial nerve deficit. Coordination normal.  Skin: Skin is warm and dry.  Psychiatric: She has a normal mood and affect. Her behavior is normal. Judgment and thought content normal.  Nursing note and vitals reviewed.   ED Course  Procedures (including critical care time) Labs Review Labs Reviewed  CBC - Abnormal; Notable for the following:    RBC 5.28 (*)    MCV 76.7 (*)    MCH 23.3 (*)    RDW 16.6 (*)    All other components within normal limits  BASIC METABOLIC PANEL - Abnormal;  Notable for the following:    Glucose, Bld 108 (*)    GFR calc non Af Amer 86 (*)    All other components within normal limits  BRAIN NATRIURETIC PEPTIDE  I-STAT TROPOININ, ED    Imaging Review Dg Chest 2 View  05/14/2014   CLINICAL DATA:  Shortness of breath for several days.  EXAM: CHEST  2 VIEW  COMPARISON:  09/12/2004  FINDINGS: The cardiomediastinal silhouette is unremarkable.  There is no evidence of focal airspace disease, pulmonary edema, suspicious pulmonary nodule/mass, pleural effusion, or pneumothorax. No acute bony abnormalities are identified.  IMPRESSION: No active cardiopulmonary disease.   Electronically Signed   By: Margarette Canada M.D.   On: 05/14/2014 15:23     EKG Interpretation   Date/Time:  Saturday May 14 2014 14:49:02 EDT Ventricular Rate:  83 PR Interval:  149 QRS Duration: 83 QT Interval:  386 QTC Calculation: 453 R Axis:   43 Text Interpretation:  Sinus rhythm Low voltage, precordial leads  Borderline T wave abnormalities Minimal ST elevation, inferior leads No  significant change since last tracing Confirmed by Andee Chivers  MD, Kynsley Whitehouse ([id number])  on 05/14/2014 5:38:17 PM      MDM   Final diagnoses:  None   Cynthia Vazquez is a 44 y.o. female here with cough, congestion. Likely seasonal allergies. EKG unremarkable. Trop neg x 1 and symptoms for several days. PERC neg so I doubt PE. Not hypoxic or tachycardic. She is on claritin. Will add flonase, prn benadryl.      Wandra Arthurs, MD 05/14/14 [phone]

## 2014-05-14 NOTE — ED Notes (Signed)
Delay on lab draw, pt in exray 

## 2014-05-14 NOTE — ED Notes (Signed)
Pt c/o SOB worsening over the past few days. Denies hx asthma. Reports central, non-radiating chest pain. Denies back pain or dizziness. Reports cough recently. SOB worse with exercise and patient reports this is not normal for her. Pt appears labored. No audible wheezing. No other c/c.

## 2014-05-14 NOTE — Discharge Instructions (Signed)
Continue claritin.   Take benadryl as needed.   Use flonase twice daily.   Follow up with your doctor.   Return to ER if you have worse congestion, shortness of breath, chest pain.

## 2014-05-16 ENCOUNTER — Other Ambulatory Visit: Payer: Self-pay | Admitting: *Deleted

## 2014-05-16 ENCOUNTER — Ambulatory Visit (HOSPITAL_BASED_OUTPATIENT_CLINIC_OR_DEPARTMENT_OTHER): Payer: BLUE CROSS/BLUE SHIELD

## 2014-05-16 DIAGNOSIS — D500 Iron deficiency anemia secondary to blood loss (chronic): Secondary | ICD-10-CM

## 2014-05-16 MED ORDER — SODIUM CHLORIDE 0.9 % IV SOLN
Freq: Once | INTRAVENOUS | Status: AC
  Administered 2014-05-16: 11:00:00 via INTRAVENOUS

## 2014-05-16 MED ORDER — SODIUM CHLORIDE 0.9 % IV SOLN
510.0000 mg | Freq: Once | INTRAVENOUS | Status: AC
  Administered 2014-05-16: 510 mg via INTRAVENOUS
  Filled 2014-05-16: qty 17

## 2014-05-16 NOTE — Patient Instructions (Signed)

## 2014-06-01 ENCOUNTER — Other Ambulatory Visit: Payer: Self-pay | Admitting: General Surgery

## 2014-06-12 ENCOUNTER — Emergency Department (INDEPENDENT_AMBULATORY_CARE_PROVIDER_SITE_OTHER)
Admission: EM | Admit: 2014-06-12 | Discharge: 2014-06-12 | Disposition: A | Discharge status: Home / Self Care | Payer: BLUE CROSS/BLUE SHIELD | Source: Home / Self Care | Attending: Family Medicine | Admitting: Family Medicine

## 2014-06-12 ENCOUNTER — Encounter (HOSPITAL_COMMUNITY): Payer: Self-pay | Admitting: *Deleted

## 2014-06-12 DIAGNOSIS — R300 Dysuria: Secondary | ICD-10-CM

## 2014-06-12 DIAGNOSIS — R358 Other polyuria: Secondary | ICD-10-CM

## 2014-06-12 DIAGNOSIS — R3589 Other polyuria: Secondary | ICD-10-CM

## 2014-06-12 HISTORY — DX: Type 2 diabetes mellitus without complications: E11.9

## 2014-06-12 LAB — POCT URINALYSIS DIP (DEVICE)
Bilirubin Urine: NEGATIVE
Glucose, UA: NEGATIVE mg/dL
Hgb urine dipstick: NEGATIVE
Ketones, ur: NEGATIVE mg/dL
Leukocytes, UA: NEGATIVE
Nitrite: NEGATIVE
PH: 5.5 (ref 5.0–8.0)
PROTEIN: NEGATIVE mg/dL
Specific Gravity, Urine: 1.015 (ref 1.005–1.030)
Urobilinogen, UA: 0.2 mg/dL (ref 0.0–1.0)

## 2014-06-12 LAB — POCT PREGNANCY, URINE: Preg Test, Ur: NEGATIVE

## 2014-06-12 NOTE — ED Provider Notes (Signed)
CSN: [id number]     Arrival date & time 06/12/14  1451 History   First MD Initiated Contact with Patient 06/12/14 1543     Chief Complaint  Patient presents with  . Urinary Tract Infection   (Consider location/radiation/quality/duration/timing/severity/associated sxs/prior Treatment) HPI Comments: Cynthia Vazquez presents today with dysuria and pressure post void. Onset 2-3 days. Symptoms began with initiation of Metformin. She also notes polyuria and some "mild" joint pain; but states sometimes happen around the time of her cycle. She has no fevers or chills. Mild diarrhea with starting Metformin, but improved with Imodium. No back pain. No hematuria. No abdominal pain.   Patient is a 44 y.o. female presenting with urinary tract infection.  Urinary Tract Infection    Past Medical History  Diagnosis Date  . Hypertension   . Iron deficiency anemia due to chronic blood loss     menstrual cycles  . Diabetes mellitus without complication    Past Surgical History  Procedure Laterality Date  . Cholecystectomy    . Cesarean section    . Tubal ligation     Family History  Problem Relation Age of Onset  . Anemia Mother   . Anemia Paternal Grandfather    History  Substance Use Topics  . Smoking status: Never Smoker   . Smokeless tobacco: Never Used  . Alcohol Use: No   OB History    No data available     Review of Systems  All other systems reviewed and are negative.   Allergies  Augmentin and Shellfish allergy  Home Medications   Prior to Admission medications   Medication Sig Start Date End Date Taking? Authorizing Provider  fluocinonide ointment (LIDEX) 0.10 % Apply 1 application topically 2 (two) times daily as needed (scalp).  04/29/14  Yes Historical Provider, MD  fluticasone (FLONASE) 50 MCG/ACT nasal spray Place 2 sprays into both nostrils daily. 05/14/14  Yes Wandra Arthurs, MD  ibuprofen (ADVIL,MOTRIN) 200 MG tablet Take 400 mg by mouth every 6 (six) hours as needed for  headache or moderate pain.    Yes Historical Provider, MD  lisinopril-hydrochlorothiazide (PRINZIDE,ZESTORETIC) 20-12.5 MG per tablet Take 1 tablet by mouth daily.   Yes Historical Provider, MD  loratadine (CLARITIN) 10 MG tablet Take 10 mg by mouth daily as needed for allergies.    Yes Historical Provider, MD  metFORMIN (GLUCOPHAGE) 500 MG tablet Take by mouth. Specialized dosing   Yes Historical Provider, MD  tranexamic acid (LYSTEDA) 650 MG TABS tablet Take 1,300 mg by mouth 3 (three) times daily. For five days during menstrual cycle.   Yes Historical Provider, MD  Vitamin D, Ergocalciferol, (DRISDOL) 50000 UNITS CAPS capsule Take 50,000 Units by mouth every 7 (seven) days.  03/29/14  Yes Historical Provider, MD   BP 123/80 mmHg  Pulse 85  Temp(Src) 97.9 F (36.6 C) (Oral)  Resp 20  SpO2 96%  LMP 05/08/2014 (Approximate) Physical Exam  Constitutional: She is oriented to person, place, and time. She appears well-developed and well-nourished. No distress.  HENT:  Head: Normocephalic and atraumatic.  Pulmonary/Chest: Effort normal.  Abdominal: Soft. Bowel sounds are normal. She exhibits no distension. There is no tenderness. There is no rebound and no guarding.  No CVA tenderness  Neurological: She is alert and oriented to person, place, and time.  Skin: Skin is warm and dry. No rash noted. She is not diaphoretic.  Psychiatric: Her behavior is normal.  Nursing note and vitals reviewed.   ED Course  Procedures (including  critical care time) Labs Review Labs Reviewed  POCT URINALYSIS DIP (DEVICE)  POCT PREGNANCY, URINE    Imaging Review No results found.   MDM   1. Dysuria   2. Polyuria    No signs of a UTI is noted. Exam unremarkable. Non-toxic. Suggest these issues may be from new start to Metformin. Suggest calling PCP tomorrow to further discuss. Reassurance given no indication of infection at this time.     Bjorn Pippin, PA-C 06/12/14 1614

## 2014-06-12 NOTE — Discharge Instructions (Signed)
° ° ° ° °  No signs of a bacterial infection, most likely side effect from Metformin. Please place a call into your PCP tomorrow for advisement re: continuation of the medication.

## 2014-06-12 NOTE — ED Notes (Signed)
Reports starting metformin 2-3 days ago; 2 days ago started with dysuria, polyuria, and slight external irritation.  Denies any hx of dysuria.  Also c/o joint pain, leg pain - unsure if related to starting metformin.

## 2014-06-23 ENCOUNTER — Other Ambulatory Visit (HOSPITAL_COMMUNITY): Payer: BLUE CROSS/BLUE SHIELD

## 2014-07-01 ENCOUNTER — Encounter (HOSPITAL_COMMUNITY): Payer: Self-pay | Admitting: Emergency Medicine

## 2014-07-01 ENCOUNTER — Emergency Department (HOSPITAL_COMMUNITY): Payer: BLUE CROSS/BLUE SHIELD

## 2014-07-01 ENCOUNTER — Emergency Department (HOSPITAL_COMMUNITY)
Admission: EM | Admit: 2014-07-01 | Discharge: 2014-07-01 | Disposition: A | Discharge status: Home / Self Care | Payer: BLUE CROSS/BLUE SHIELD | Attending: Emergency Medicine | Admitting: Emergency Medicine

## 2014-07-01 DIAGNOSIS — E119 Type 2 diabetes mellitus without complications: Secondary | ICD-10-CM | POA: Diagnosis not present

## 2014-07-01 DIAGNOSIS — Z862 Personal history of diseases of the blood and blood-forming organs and certain disorders involving the immune mechanism: Secondary | ICD-10-CM | POA: Insufficient documentation

## 2014-07-01 DIAGNOSIS — Z7951 Long term (current) use of inhaled steroids: Secondary | ICD-10-CM | POA: Insufficient documentation

## 2014-07-01 DIAGNOSIS — Z9889 Other specified postprocedural states: Secondary | ICD-10-CM | POA: Insufficient documentation

## 2014-07-01 DIAGNOSIS — Z9851 Tubal ligation status: Secondary | ICD-10-CM | POA: Insufficient documentation

## 2014-07-01 DIAGNOSIS — Z79899 Other long term (current) drug therapy: Secondary | ICD-10-CM | POA: Diagnosis not present

## 2014-07-01 DIAGNOSIS — K625 Hemorrhage of anus and rectum: Secondary | ICD-10-CM | POA: Diagnosis not present

## 2014-07-01 DIAGNOSIS — Z3202 Encounter for pregnancy test, result negative: Secondary | ICD-10-CM | POA: Diagnosis not present

## 2014-07-01 DIAGNOSIS — K602 Anal fissure, unspecified: Secondary | ICD-10-CM | POA: Insufficient documentation

## 2014-07-01 DIAGNOSIS — Z9049 Acquired absence of other specified parts of digestive tract: Secondary | ICD-10-CM | POA: Insufficient documentation

## 2014-07-01 DIAGNOSIS — N281 Cyst of kidney, acquired: Secondary | ICD-10-CM | POA: Insufficient documentation

## 2014-07-01 DIAGNOSIS — I10 Essential (primary) hypertension: Secondary | ICD-10-CM | POA: Diagnosis not present

## 2014-07-01 DIAGNOSIS — R1031 Right lower quadrant pain: Secondary | ICD-10-CM | POA: Diagnosis present

## 2014-07-01 LAB — COMPREHENSIVE METABOLIC PANEL
ALT: 20 U/L (ref 14–54)
AST: 24 U/L (ref 15–41)
Albumin: 4.6 g/dL (ref 3.5–5.0)
Alkaline Phosphatase: 88 U/L (ref 38–126)
Anion gap: 9 (ref 5–15)
BILIRUBIN TOTAL: 0.8 mg/dL (ref 0.3–1.2)
BUN: 12 mg/dL (ref 6–20)
CALCIUM: 9.8 mg/dL (ref 8.9–10.3)
CHLORIDE: 102 mmol/L (ref 101–111)
CO2: 26 mmol/L (ref 22–32)
CREATININE: 0.83 mg/dL (ref 0.44–1.00)
GLUCOSE: 89 mg/dL (ref 70–99)
Potassium: 3.6 mmol/L (ref 3.5–5.1)
SODIUM: 137 mmol/L (ref 135–145)
Total Protein: 8.5 g/dL — ABNORMAL HIGH (ref 6.5–8.1)

## 2014-07-01 LAB — CBC WITH DIFFERENTIAL/PLATELET
Basophils Absolute: 0 10*3/uL (ref 0.0–0.1)
Basophils Relative: 1 % (ref 0–1)
EOS ABS: 0.1 10*3/uL (ref 0.0–0.7)
Eosinophils Relative: 2 % (ref 0–5)
HCT: 42.7 % (ref 36.0–46.0)
Hemoglobin: 13.4 g/dL (ref 12.0–15.0)
LYMPHS ABS: 3 10*3/uL (ref 0.7–4.0)
Lymphocytes Relative: 45 % (ref 12–46)
MCH: 24.4 pg — AB (ref 26.0–34.0)
MCHC: 31.4 g/dL (ref 30.0–36.0)
MCV: 77.8 fL — ABNORMAL LOW (ref 78.0–100.0)
MONOS PCT: 9 % (ref 3–12)
Monocytes Absolute: 0.6 10*3/uL (ref 0.1–1.0)
Neutro Abs: 2.9 10*3/uL (ref 1.7–7.7)
Neutrophils Relative %: 43 % (ref 43–77)
Platelets: 311 10*3/uL (ref 150–400)
RBC: 5.49 MIL/uL — AB (ref 3.87–5.11)
RDW: 17 % — ABNORMAL HIGH (ref 11.5–15.5)
WBC: 6.6 10*3/uL (ref 4.0–10.5)

## 2014-07-01 LAB — URINALYSIS, ROUTINE W REFLEX MICROSCOPIC
Bilirubin Urine: NEGATIVE
Glucose, UA: NEGATIVE mg/dL
Hgb urine dipstick: NEGATIVE
KETONES UR: NEGATIVE mg/dL
Leukocytes, UA: NEGATIVE
NITRITE: NEGATIVE
PH: 6 (ref 5.0–8.0)
Protein, ur: NEGATIVE mg/dL
Specific Gravity, Urine: 1.015 (ref 1.005–1.030)
UROBILINOGEN UA: 0.2 mg/dL (ref 0.0–1.0)

## 2014-07-01 LAB — PROTIME-INR
INR: 1.02 (ref 0.00–1.49)
PROTHROMBIN TIME: 13.5 s (ref 11.6–15.2)

## 2014-07-01 LAB — POC URINE PREG, ED: PREG TEST UR: NEGATIVE

## 2014-07-01 LAB — TYPE AND SCREEN
ABO/RH(D): O POS
ANTIBODY SCREEN: NEGATIVE

## 2014-07-01 LAB — ABO/RH: ABO/RH(D): O POS

## 2014-07-01 LAB — I-STAT BETA HCG BLOOD, ED (MC, WL, AP ONLY)

## 2014-07-01 LAB — POC OCCULT BLOOD, ED: Fecal Occult Bld: POSITIVE — AB

## 2014-07-01 MED ORDER — IOHEXOL 300 MG/ML  SOLN
100.0000 mL | Freq: Once | INTRAMUSCULAR | Status: AC | PRN
  Administered 2014-07-01: 100 mL via INTRAVENOUS

## 2014-07-01 NOTE — ED Notes (Signed)
Nurse at bedside starting IV

## 2014-07-01 NOTE — ED Notes (Signed)
Pt c/o RLQ abdominal pain onset 3 days ago, dark stool onset yesterday, this morning pt noted moderate amount of bright red blood in her stool and in toilet x 3 with bowel movement along with some anal soreness. Pt c/o weakness. Pt denies Hx of hemorrhoids.

## 2014-07-01 NOTE — ED Notes (Signed)
MD at bedside. 

## 2014-07-01 NOTE — ED Provider Notes (Signed)
CSN: [id number]     Arrival date & time 07/01/14  1413 History   First MD Initiated Contact with Patient 07/01/14 1507     Chief Complaint  Patient presents with  . Abdominal Pain  . Rectal Bleeding     (Consider location/radiation/quality/duration/timing/severity/associated sxs/prior Treatment) HPI Is a 45 year old female with history of iron deficiency anemia who presents today complaining of rectal bleeding and right flank pain for the past 2 days. She states that she got constipated 2 days ago and had a bowel movement and then noticed some blood in the stool. She continued to have some crampy right lower quadrant pain. She felt like she did have a bowel movement but did not have one. She then had multiple bowel movements last night and noticed some blood in the commode. She has not been weak, lightheaded, or had syncope. Past Medical History  Diagnosis Date  . Hypertension   . Iron deficiency anemia due to chronic blood loss     menstrual cycles  . Diabetes mellitus without complication    Past Surgical History  Procedure Laterality Date  . Cholecystectomy    . Cesarean section    . Tubal ligation     Family History  Problem Relation Age of Onset  . Anemia Mother   . Anemia Paternal Grandfather    History  Substance Use Topics  . Smoking status: Never Smoker   . Smokeless tobacco: Never Used  . Alcohol Use: No   OB History    No data available     Review of Systems  All other systems reviewed and are negative.     Allergies  Augmentin and Shellfish allergy  Home Medications   Prior to Admission medications   Medication Sig Start Date End Date Taking? Authorizing Provider  fluocinonide ointment (LIDEX) 0.03 % Apply 1 application topically 2 (two) times daily as needed (scalp).  04/29/14   Historical Provider, MD  fluticasone (FLONASE) 50 MCG/ACT nasal spray Place 2 sprays into both nostrils daily. 05/14/14   Wandra Arthurs, MD  ibuprofen (ADVIL,MOTRIN) 200 MG  tablet Take 400 mg by mouth every 6 (six) hours as needed for headache or moderate pain.     Historical Provider, MD  lisinopril-hydrochlorothiazide (PRINZIDE,ZESTORETIC) 20-12.5 MG per tablet Take 1 tablet by mouth daily.    Historical Provider, MD  loratadine (CLARITIN) 10 MG tablet Take 10 mg by mouth daily as needed for allergies.     Historical Provider, MD  metFORMIN (GLUCOPHAGE) 500 MG tablet Take by mouth. Specialized dosing    Historical Provider, MD  tranexamic acid (LYSTEDA) 650 MG TABS tablet Take 1,300 mg by mouth 3 (three) times daily. For five days during menstrual cycle.    Historical Provider, MD  Vitamin D, Ergocalciferol, (DRISDOL) 50000 UNITS CAPS capsule Take 50,000 Units by mouth every 7 (seven) days.  03/29/14   Historical Provider, MD   BP 142/85 mmHg  Pulse 76  Temp(Src) 98.1 F (36.7 C) (Oral)  Resp 16  SpO2 97%  LMP 04/24/2014 Physical Exam  Constitutional: She is oriented to person, place, and time. She appears well-developed and well-nourished.  Morbidly obese  HENT:  Head: Normocephalic and atraumatic.  Right Ear: External ear normal.  Left Ear: External ear normal.  Nose: Nose normal.  Mouth/Throat: Oropharynx is clear and moist.  Eyes: Conjunctivae and EOM are normal. Pupils are equal, round, and reactive to light.  Neck: Normal range of motion. Neck supple. No JVD present. No tracheal deviation present. No  thyromegaly present.  Cardiovascular: Normal rate, regular rhythm, normal heart sounds and intact distal pulses.   Pulmonary/Chest: Effort normal and breath sounds normal. She has no wheezes.  Abdominal: Soft. Bowel sounds are normal. She exhibits no mass. There is tenderness. There is no guarding.  Mild tenderness palpation right lower quadrant.  Genitourinary:  Rectal exam significant for an area that is tender and denuded at 12:00 consistent with fissure.  Musculoskeletal: Normal range of motion.  Lymphadenopathy:    She has no cervical adenopathy.   Neurological: She is alert and oriented to person, place, and time. She has normal reflexes. No cranial nerve deficit or sensory deficit. Gait normal. GCS eye subscore is 4. GCS verbal subscore is 5. GCS motor subscore is 6.  Reflex Scores:      Bicep reflexes are 2+ on the right side and 2+ on the left side.      Patellar reflexes are 2+ on the right side and 2+ on the left side. Strength is normal and equal throughout. Cranial nerves grossly intact. Patient fluent. No gross ataxia and patient able to ambulate without difficulty.  Skin: Skin is warm and dry.  Psychiatric: She has a normal mood and affect. Her behavior is normal. Judgment and thought content normal.  Nursing note and vitals reviewed.   ED Course  Procedures (including critical care time) Labs Review Labs Reviewed  CBC WITH DIFFERENTIAL/PLATELET - Abnormal; Notable for the following:    RBC 5.49 (*)    MCV 77.8 (*)    MCH 24.4 (*)    RDW 17.0 (*)    All other components within normal limits  COMPREHENSIVE METABOLIC PANEL - Abnormal; Notable for the following:    Total Protein 8.5 (*)    All other components within normal limits  URINALYSIS, ROUTINE W REFLEX MICROSCOPIC - Abnormal; Notable for the following:    APPearance CLOUDY (*)    All other components within normal limits  POC OCCULT BLOOD, ED - Abnormal; Notable for the following:    Fecal Occult Bld POSITIVE (*)    All other components within normal limits  PROTIME-INR  POC URINE PREG, ED  POC OCCULT BLOOD, ED  POC OCCULT BLOOD, ED  I-STAT BETA HCG BLOOD, ED ([facility], [facility], [facility] ONLY)  TYPE AND SCREEN  ABO/RH    Imaging Review No results found.   EKG Interpretation None      MDM   Final diagnoses:  Anal fissure  Renal cyst  Rectal bleed    44 year old female comes in today stating she has had some blood in her stool. It sounds like that this has been superficial on the outside of the stool. But she has also had some crampy right lower quadrant  pain is concerned because of this. On her exam showed some tenderness and subsequently had a CT scan that did not show any evidence of acute abnormality in the right lower quadrant such as appendicitis. A meal but her hemoglobin today here is 13. Her rectal exam reveals an area in the rectum that looks like a rectal fissure. She is advised regarding this. She is given return cautions regarding bleeding and a referral to gastroenterology for follow-up. She voices understanding of return precautions and the need for follow-up.    Pattricia Boss, MD 07/04/14 [phone]

## 2014-07-01 NOTE — Discharge Instructions (Signed)
Anal Fissure, Adult An anal fissure is a small tear or crack in the skin around the anus. Bleeding from a fissure usually stops on its own within a few minutes. However, bleeding will often reoccur with each bowel movement until the crack heals.  CAUSES   Passing large, hard stools.  Frequent diarrheal stools.  Constipation.  Inflammatory bowel disease (Crohn's disease or ulcerative colitis).  Infections.  Anal sex. SYMPTOMS   Small amounts of blood seen on your stools, on toilet paper, or in the toilet after a bowel movement.  Rectal bleeding.  Painful bowel movements.  Itching or irritation around the anus. DIAGNOSIS Your caregiver will examine the anal area. An anal fissure can usually be seen with careful inspection. A rectal exam may be performed and a short tube (anoscope) may be used to examine the anal canal. TREATMENT   You may be instructed to take fiber supplements. These supplements can soften your stool to help make bowel movements easier.  Sitz baths may be recommended to help heal the tear. Do not use soap in the sitz baths.  A medicated cream or ointment may be prescribed to lessen discomfort. HOME CARE INSTRUCTIONS   Maintain a diet high in fruits, whole grains, and vegetables. Avoid constipating foods like bananas and dairy products.  Take sitz baths as directed by your caregiver.  Drink enough fluids to keep your urine clear or pale yellow.  Only take over-the-counter or prescription medicines for pain, discomfort, or fever as directed by your caregiver. Do not take aspirin as this may increase bleeding.  Do not use ointments containing numbing medications (anesthetics) or hydrocortisone. They could slow healing. SEEK MEDICAL CARE IF:   Your fissure is not completely healed within 3 days.  You have further bleeding.  You have a fever.  You have diarrhea mixed with blood.  You have pain.  Your problem is getting worse rather than  better. MAKE SURE YOU:   Understand these instructions.  Will watch your condition.  Will get help right away if you are not doing well or get worse. Document Released: 02/11/2005 Document Revised: 05/06/2011 Document Reviewed: 07/29/2010 [facility] Patient Information 2015 [place], [place]. This information is not intended to replace advice given to you by your health care provider. Make sure you discuss any questions you have with your health care provider. Rectal Bleeding Rectal bleeding is when blood passes out of the anus. It is usually a sign that something is wrong. It may not be serious, but it should always be evaluated. Rectal bleeding may present as bright red blood or extremely dark stools. The color may range from dark red or maroon to black (like tar). It is important that the cause of rectal bleeding be identified so treatment can be started and the problem corrected. CAUSES   Hemorrhoids. These are enlarged (dilated) blood vessels or veins in the anal or rectal area.  Fistulas. Theseare abnormal, burrowing channels that usually run from inside the rectum to the skin around the anus. They can bleed.  Anal fissures. This is a tear in the tissue of the anus. Bleeding occurs with bowel movements.  Diverticulosis. This is a condition in which pockets or sacs project from the bowel wall. Occasionally, the sacs can bleed.  Diverticulitis. Thisis an infection involving diverticulosis of the colon.  Proctitis and colitis. These are conditions in which the rectum, colon, or both, can become inflamed and pitted (ulcerated).  Polyps and cancer. Polyps are non-cancerous (benign) growths in the colon  that may bleed. Certain types of polyps turn into cancer.  Protrusion of the rectum. Part of the rectum can project from the anus and bleed.  Certain medicines.  Intestinal infections.  Blood vessel abnormalities. HOME CARE INSTRUCTIONS  Eat a high-fiber diet to keep your stool  soft.  Limit activity.  Drink enough fluids to keep your urine clear or pale yellow.  Warm baths may be useful to soothe rectal pain.  Follow up with your caregiver as directed. SEEK IMMEDIATE MEDICAL CARE IF:  You develop increased bleeding.  You have black or dark red stools.  You vomit blood or material that looks like coffee grounds.  You have abdominal pain or tenderness.  You have a fever.  You feel weak, nauseous, or you faint.  You have severe rectal pain or you are unable to have a bowel movement. MAKE SURE YOU:  Understand these instructions.  Will watch your condition.  Will get help right away if you are not doing well or get worse. Document Released: 08/03/2001 Document Revised: 05/06/2011 Document Reviewed: 07/29/2010 [facility] Patient Information 2015 [place], [place]. This information is not intended to replace advice given to you by your health care provider. Make sure you discuss any questions you have with your health care provider.

## 2014-07-01 NOTE — ED Notes (Signed)
Attempt x2 for IV access, unsuccessful

## 2014-07-13 ENCOUNTER — Ambulatory Visit (HOSPITAL_COMMUNITY)
Admission: RE | Admit: 2014-07-13 | Discharge: 2014-07-13 | Disposition: A | Discharge status: Home / Self Care | Payer: BLUE CROSS/BLUE SHIELD | Source: Ambulatory Visit | Attending: General Surgery | Admitting: General Surgery

## 2014-07-13 ENCOUNTER — Encounter (INDEPENDENT_AMBULATORY_CARE_PROVIDER_SITE_OTHER): Payer: Self-pay

## 2014-07-13 DIAGNOSIS — I10 Essential (primary) hypertension: Secondary | ICD-10-CM | POA: Diagnosis not present

## 2014-07-13 DIAGNOSIS — D500 Iron deficiency anemia secondary to blood loss (chronic): Secondary | ICD-10-CM | POA: Insufficient documentation

## 2014-07-13 DIAGNOSIS — E119 Type 2 diabetes mellitus without complications: Secondary | ICD-10-CM | POA: Insufficient documentation

## 2014-07-13 DIAGNOSIS — K449 Diaphragmatic hernia without obstruction or gangrene: Secondary | ICD-10-CM | POA: Insufficient documentation

## 2014-07-13 DIAGNOSIS — N920 Excessive and frequent menstruation with regular cycle: Secondary | ICD-10-CM | POA: Diagnosis not present

## 2014-07-13 DIAGNOSIS — Z9049 Acquired absence of other specified parts of digestive tract: Secondary | ICD-10-CM | POA: Diagnosis not present

## 2014-07-13 DIAGNOSIS — Z6841 Body Mass Index (BMI) 40.0 and over, adult: Secondary | ICD-10-CM | POA: Diagnosis not present

## 2014-07-22 ENCOUNTER — Encounter (HOSPITAL_COMMUNITY)
Admission: RE | Disposition: A | Discharge status: Home / Self Care | Payer: Self-pay | Source: Ambulatory Visit | Attending: General Surgery

## 2014-07-22 ENCOUNTER — Ambulatory Visit (HOSPITAL_COMMUNITY)
Admission: RE | Admit: 2014-07-22 | Discharge: 2014-07-22 | Disposition: A | Discharge status: Home / Self Care | Payer: BLUE CROSS/BLUE SHIELD | Source: Ambulatory Visit | Attending: General Surgery | Admitting: General Surgery

## 2014-07-22 HISTORY — PX: BREATH TEK H PYLORI: SHX5422

## 2014-07-22 SURGERY — BREATH TEST, FOR HELICOBACTER PYLORI

## 2014-07-22 NOTE — Progress Notes (Signed)
   07/22/14 0820  BREATH TEK ASSESSMENT  Referring MD Greer Pickerel  Time of Last PO Intake 2000  Baseline Breath At: 0725  Pranactin Given At: 0730  Post-Dose Breath At: 0745  Sample 1 1.7%  Sample 2 1.6%  Test Postive

## 2014-07-26 ENCOUNTER — Encounter (HOSPITAL_COMMUNITY): Payer: Self-pay | Admitting: General Surgery

## 2014-07-30 ENCOUNTER — Encounter: Payer: BLUE CROSS/BLUE SHIELD | Attending: General Surgery | Admitting: Dietician

## 2014-07-30 ENCOUNTER — Encounter: Payer: Self-pay | Admitting: Dietician

## 2014-07-30 DIAGNOSIS — Z713 Dietary counseling and surveillance: Secondary | ICD-10-CM | POA: Diagnosis not present

## 2014-07-30 DIAGNOSIS — Z6841 Body Mass Index (BMI) 40.0 and over, adult: Secondary | ICD-10-CM | POA: Diagnosis not present

## 2014-07-30 NOTE — Patient Instructions (Signed)

## 2014-07-30 NOTE — Progress Notes (Signed)
  Pre-Op Assessment Visit:  Pre-Operative Sleeve Gastrectomy Surgery  Medical Nutrition Therapy:  Appt start time: 0459   End time:  0830.  Patient was seen on 07/30/2014 for Pre-Operative Nutrition Assessment. Assessment and letter of approval faxed to [facility] Surgery Bariatric Surgery Program coordinator on 07/30/2014.   Preferred Learning Style:   No preference indicated   Learning Readiness:   Ready  Handouts given during visit include:  Pre-Op Goals Bariatric Surgery Protein Shakes   During the appointment today the following Pre-Op Goals were reviewed with the patient: Maintain or lose weight as instructed by your surgeon Make healthy food choices Begin to limit portion sizes Limited concentrated sugars and fried foods Keep fat/sugar in the single digits per serving on   food labels Practice CHEWING your food  (aim for 30 chews per bite or until applesauce consistency) Practice not drinking 15 minutes before, during, and 30 minutes after each meal/snack Avoid all carbonated beverages  Avoid/limit caffeinated beverages  Avoid all sugar-sweetened beverages Consume 3 meals per day; eat every 3-5 hours Make a list of non-food related activities Aim for 64-100 ounces of FLUID daily  Aim for at least 60-80 grams of PROTEIN daily Look for a liquid protein source that contain ?15 g protein and ?5 g carbohydrate  (ex: shakes, drinks, shots)  Patient-Centered Goals: Faizah would like to be healthy and live longer.  10 level of confidence/10 level of importance  Demonstrated degree of understanding via:  Teach Back  Teaching Method Utilized:  Visual Auditory Hands on  Barriers to learning/adherence to lifestyle change: none  Patient to call the [facility] to enroll in Pre-Op and Post-Op Nutrition Education when surgery date is scheduled.

## 2014-08-27 ENCOUNTER — Emergency Department (HOSPITAL_COMMUNITY)
Admission: EM | Admit: 2014-08-27 | Discharge: 2014-08-27 | Disposition: A | Discharge status: Home / Self Care | Payer: BLUE CROSS/BLUE SHIELD | Attending: Emergency Medicine | Admitting: Emergency Medicine

## 2014-08-27 ENCOUNTER — Encounter (HOSPITAL_COMMUNITY): Payer: Self-pay | Admitting: Emergency Medicine

## 2014-08-27 ENCOUNTER — Emergency Department (HOSPITAL_COMMUNITY): Payer: BLUE CROSS/BLUE SHIELD

## 2014-08-27 DIAGNOSIS — Z79899 Other long term (current) drug therapy: Secondary | ICD-10-CM | POA: Diagnosis not present

## 2014-08-27 DIAGNOSIS — Z7982 Long term (current) use of aspirin: Secondary | ICD-10-CM | POA: Diagnosis not present

## 2014-08-27 DIAGNOSIS — I10 Essential (primary) hypertension: Secondary | ICD-10-CM | POA: Insufficient documentation

## 2014-08-27 DIAGNOSIS — Z862 Personal history of diseases of the blood and blood-forming organs and certain disorders involving the immune mechanism: Secondary | ICD-10-CM | POA: Diagnosis not present

## 2014-08-27 DIAGNOSIS — E669 Obesity, unspecified: Secondary | ICD-10-CM | POA: Diagnosis not present

## 2014-08-27 DIAGNOSIS — Z7951 Long term (current) use of inhaled steroids: Secondary | ICD-10-CM | POA: Diagnosis not present

## 2014-08-27 DIAGNOSIS — E119 Type 2 diabetes mellitus without complications: Secondary | ICD-10-CM | POA: Insufficient documentation

## 2014-08-27 DIAGNOSIS — R079 Chest pain, unspecified: Secondary | ICD-10-CM | POA: Diagnosis not present

## 2014-08-27 LAB — I-STAT TROPONIN, ED
Troponin i, poc: 0 ng/mL (ref 0.00–0.08)
Troponin i, poc: 0 ng/mL (ref 0.00–0.08)

## 2014-08-27 LAB — CBC WITH DIFFERENTIAL/PLATELET
BASOS ABS: 0 10*3/uL (ref 0.0–0.1)
BASOS PCT: 1 % (ref 0–1)
Eosinophils Absolute: 0.1 10*3/uL (ref 0.0–0.7)
Eosinophils Relative: 2 % (ref 0–5)
HCT: 39.5 % (ref 36.0–46.0)
Hemoglobin: 12.4 g/dL (ref 12.0–15.0)
LYMPHS PCT: 40 % (ref 12–46)
Lymphs Abs: 2.3 10*3/uL (ref 0.7–4.0)
MCH: 24.5 pg — ABNORMAL LOW (ref 26.0–34.0)
MCHC: 31.4 g/dL (ref 30.0–36.0)
MCV: 77.9 fL — AB (ref 78.0–100.0)
MONO ABS: 0.6 10*3/uL (ref 0.1–1.0)
Monocytes Relative: 11 % (ref 3–12)
Neutro Abs: 2.6 10*3/uL (ref 1.7–7.7)
Neutrophils Relative %: 46 % (ref 43–77)
Platelets: 287 10*3/uL (ref 150–400)
RBC: 5.07 MIL/uL (ref 3.87–5.11)
RDW: 15.2 % (ref 11.5–15.5)
WBC: 5.6 10*3/uL (ref 4.0–10.5)

## 2014-08-27 LAB — BASIC METABOLIC PANEL
Anion gap: 10 (ref 5–15)
BUN: 12 mg/dL (ref 6–20)
CALCIUM: 9.2 mg/dL (ref 8.9–10.3)
CO2: 24 mmol/L (ref 22–32)
Chloride: 103 mmol/L (ref 101–111)
Creatinine, Ser: 0.73 mg/dL (ref 0.44–1.00)
GFR calc Af Amer: >60 mL/min (ref >60)
GFR calc non Af Amer: >60 mL/min (ref >60)
Glucose, Bld: 111 mg/dL — ABNORMAL HIGH (ref 65–99)
Potassium: 4.1 mmol/L (ref 3.5–5.1)
Sodium: 137 mmol/L (ref 135–145)

## 2014-08-27 MED ORDER — ASPIRIN 81 MG PO CHEW
324.0000 mg | CHEWABLE_TABLET | Freq: Once | ORAL | Status: AC
Start: 2014-08-27 — End: 2014-08-27
  Administered 2014-08-27: 324 mg via ORAL
  Filled 2014-08-27: qty 4

## 2014-08-27 MED ORDER — SIMETHICONE 80 MG PO CHEW
80.0000 mg | CHEWABLE_TABLET | Freq: Once | ORAL | Status: AC
  Administered 2014-08-27: 80 mg via ORAL
  Filled 2014-08-27: qty 1

## 2014-08-27 NOTE — Discharge Instructions (Signed)
Chest Pain (Nonspecific) Return for sweating, chest pain or shortness of breath.  Follow up with your primary care physician in 3 days.  It is often hard to give a specific diagnosis for the cause of chest pain. There is always a chance that your pain could be related to something serious, such as a heart attack or a blood clot in the lungs. You need to follow up with your health care provider for further evaluation. CAUSES   Heartburn.  Pneumonia or bronchitis.  Anxiety or stress.  Inflammation around your heart (pericarditis) or lung (pleuritis or pleurisy).  A blood clot in the lung.  A collapsed lung (pneumothorax). It can develop suddenly on its own (spontaneous pneumothorax) or from trauma to the chest.  Shingles infection (herpes zoster virus). The chest wall is composed of bones, muscles, and cartilage. Any of these can be the source of the pain.  The bones can be bruised by injury.  The muscles or cartilage can be strained by coughing or overwork.  The cartilage can be affected by inflammation and become sore (costochondritis). DIAGNOSIS  Lab tests or other studies may be needed to find the cause of your pain. Your health care provider may have you take a test called an ambulatory electrocardiogram (ECG). An ECG records your heartbeat patterns over a 24-hour period. You may also have other tests, such as:  Transthoracic echocardiogram (TTE). During echocardiography, sound waves are used to evaluate how blood flows through your heart.  Transesophageal echocardiogram (TEE).  Cardiac monitoring. This allows your health care provider to monitor your heart rate and rhythm in real time.  Holter monitor. This is a portable device that records your heartbeat and can help diagnose heart arrhythmias. It allows your health care provider to track your heart activity for several days, if needed.  Stress tests by exercise or by giving medicine that makes the heart beat  faster. TREATMENT   Treatment depends on what may be causing your chest pain. Treatment may include:  Acid blockers for heartburn.  Anti-inflammatory medicine.  Pain medicine for inflammatory conditions.  Antibiotics if an infection is present.  You may be advised to change lifestyle habits. This includes stopping smoking and avoiding alcohol, caffeine, and chocolate.  You may be advised to keep your head raised (elevated) when sleeping. This reduces the chance of acid going backward from your stomach into your esophagus. Most of the time, nonspecific chest pain will improve within 2-3 days with rest and mild pain medicine.  HOME CARE INSTRUCTIONS   If antibiotics were prescribed, take them as directed. Finish them even if you start to feel better.  For the next few days, avoid physical activities that bring on chest pain. Continue physical activities as directed.  Do not use any tobacco products, including cigarettes, chewing tobacco, or electronic cigarettes.  Avoid drinking alcohol.  Only take medicine as directed by your health care provider.  Follow your health care provider's suggestions for further testing if your chest pain does not go away.  Keep any follow-up appointments you made. If you do not go to an appointment, you could develop lasting (chronic) problems with pain. If there is any problem keeping an appointment, call to reschedule. SEEK MEDICAL CARE IF:   Your chest pain does not go away, even after treatment.  You have a rash with blisters on your chest.  You have a fever. SEEK IMMEDIATE MEDICAL CARE IF:   You have increased chest pain or pain that spreads to your  arm, neck, jaw, back, or abdomen.  You have shortness of breath.  You have an increasing cough, or you cough up blood.  You have severe back or abdominal pain.  You feel nauseous or vomit.  You have severe weakness.  You faint.  You have chills. This is an emergency. Do not wait to  see if the pain will go away. Get medical help at once. Call your local emergency services (911 in [place] Do not drive yourself to the hospital. MAKE SURE YOU:   Understand these instructions.  Will watch your condition.  Will get help right away if you are not doing well or get worse. Document Released: 11/21/2004 Document Revised: 02/16/2013 Document Reviewed: 09/17/2007 [facility] Patient Information 2015 [place], [place]. This information is not intended to replace advice given to you by your health care provider. Make sure you discuss any questions you have with your health care provider.

## 2014-08-27 NOTE — ED Provider Notes (Signed)
CSN: [id number]     Arrival date & time 08/27/14  1150 History   First MD Initiated Contact with Patient 08/27/14 1216     Chief Complaint  Patient presents with  . Chest Pain     (Consider location/radiation/quality/duration/timing/severity/associated sxs/prior Treatment) Patient is a 44 y.o. female presenting with chest pain.  Chest Pain Associated symptoms: no abdominal pain, no cough, no palpitations and no shortness of breath   Cynthia Vazquez is a 44 year old female with a history of hypertension, diabetes, and anemia who presents with one episode of chest pain that began at 10 AM this morning and lasted 5-7 minutes. She describes it as a sharp pain in the center of her chest. She states that she thought it was gas pains and took tums and an 81 mg aspirin which relieved her pain. She states she has been burping all morning and passing gas. She denies any radiation of the pain. She denies any fever, chills, shortness of breath, diaphoresis, cough, abdominal pain, nausea, vomiting, diarrhea, leg swelling. She denies smoking or drug use. She denies any recent travel or surgery. She denies having a history of DVT or PE. She denies any estrogen use. She has a family history of heart disease with both mother and father having an MI around the age of 79.  Past Medical History  Diagnosis Date  . Hypertension   . Iron deficiency anemia due to chronic blood loss     menstrual cycles  . Diabetes mellitus without complication    Past Surgical History  Procedure Laterality Date  . Cholecystectomy    . Cesarean section    . Tubal ligation    . Breath tek h pylori N/A 07/22/2014    Procedure: BREATH TEK H PYLORI;  Surgeon: Greer Pickerel, MD;  Location: [facility];  Service: General;  Laterality: N/A;   Family History  Problem Relation Age of Onset  . Anemia Mother   . Anemia Paternal Grandfather    History  Substance Use Topics  . Smoking status: Never Smoker   . Smokeless tobacco: Never Used   . Alcohol Use: No   OB History    No data available     Review of Systems  Respiratory: Negative for cough, chest tightness and shortness of breath.   Cardiovascular: Positive for chest pain. Negative for palpitations and leg swelling.  Gastrointestinal: Negative for abdominal pain.  All other systems reviewed and are negative.     Allergies  Shellfish allergy and Augmentin  Home Medications   Prior to Admission medications   Medication Sig Start Date End Date Taking? Authorizing Provider  aspirin 81 MG chewable tablet Chew 81 mg by mouth every morning.   Yes Historical Provider, MD  cholecalciferol (VITAMIN D) 1000 UNITS tablet Take 1,000 Units by mouth daily.   Yes Historical Provider, MD  fluocinonide (LIDEX) 0.05 % external solution Apply 1 application topically 2 (two) times daily as needed (for scalp).  07/16/14  Yes Historical Provider, MD  fluocinonide ointment (LIDEX) 4.40 % Apply 1 application topically 2 (two) times daily as needed (scalp).  04/29/14  Yes Historical Provider, MD  fluticasone (FLONASE) 50 MCG/ACT nasal spray Place 2 sprays into both nostrils daily. Patient taking differently: Place 2 sprays into both nostrils daily as needed.  05/14/14  Yes Wandra Arthurs, MD  glimepiride (AMARYL) 2 MG tablet Take 1 tablet by mouth every morning. With largest meal 06/14/14  Yes Historical Provider, MD  ibuprofen (ADVIL,MOTRIN) 200 MG tablet Take 400  mg by mouth every 6 (six) hours as needed for headache or moderate pain.    Yes Historical Provider, MD  lisinopril-hydrochlorothiazide (PRINZIDE,ZESTORETIC) 20-12.5 MG per tablet Take 1 tablet by mouth daily.   Yes Historical Provider, MD  loratadine (CLARITIN) 10 MG tablet Take 10 mg by mouth daily as needed for allergies.    Yes Historical Provider, MD  tranexamic acid (LYSTEDA) 650 MG TABS tablet Take 1,300 mg by mouth 3 (three) times daily. For five days during menstrual cycle.   Yes Historical Provider, MD   BP 128/94 mmHg   Pulse 60  Temp(Src) 97.8 F (36.6 C) (Oral)  Resp 16  SpO2 99%  LMP 08/06/2014 (Approximate) Physical Exam  Constitutional: She is oriented to person, place, and time. Vital signs are normal. She appears well-developed and well-nourished.  Obese.  HENT:  Head: Normocephalic and atraumatic.  Eyes: Conjunctivae are normal.  Neck: Normal range of motion. Neck supple.  Cardiovascular: Normal rate, regular rhythm and normal heart sounds.   Pulmonary/Chest: Effort normal and breath sounds normal. No respiratory distress. She has no wheezes. She has no rales.  Abdominal: Soft. There is no tenderness.  Musculoskeletal: Normal range of motion.  No calf tenderness bilaterally.  Neurological: She is alert and oriented to person, place, and time.  Skin: Skin is warm and dry.  Psychiatric: She has a normal mood and affect. Her behavior is normal.  Nursing note and vitals reviewed.   ED Course  Procedures (including critical care time) Labs Review Labs Reviewed  CBC WITH DIFFERENTIAL/PLATELET - Abnormal; Notable for the following:    MCV 77.9 (*)    MCH 24.5 (*)    All other components within normal limits  BASIC METABOLIC PANEL - Abnormal; Notable for the following:    Glucose, Bld 111 (*)    All other components within normal limits  I-STAT TROPOININ, ED  I-STAT TROPOININ, ED    Imaging Review Dg Chest 2 View  08/27/2014   CLINICAL DATA:  Anterior left chest pain, left arm pain for 3 hours.  EXAM: CHEST  2 VIEW  COMPARISON:  05/14/2014  FINDINGS: The heart size and mediastinal contours are within normal limits. Both lungs are clear. The visualized skeletal structures are unremarkable.  IMPRESSION: No active cardiopulmonary disease.   Electronically Signed   By: Rolm Baptise M.D.   On: 08/27/2014 13:32     EKG Interpretation   Date/Time:  Saturday August 27 2014 11:56:50 EDT Ventricular Rate:  82 PR Interval:  157 QRS Duration: 64 QT Interval:  491 QTC Calculation: 574 R Axis:    48 Text Interpretation:  Sinus rhythm Probable left atrial enlargement  Borderline T abnormalities, diffuse leads Prolonged QT interval Baseline  wander in lead(s) I III aVL aVF No significant change since last tracing  Confirmed by Winfred Leeds  MD, SAM [phone]) on 08/27/2014 12:34:58 PM      MDM   Final diagnoses:  Chest pain, unspecified chest pain type  Vitals stable. History of chest pain is atypical. She states that it is better with walking around and movement. She has 3 risk factors on the heart score but otherwise at low risk for major cardiac event. Her labs are unremarkable and troponin is negative. Her EKG is not concerning. Patient is PERC negative. Medications  aspirin chewable tablet 324 mg (324 mg Oral Given 08/27/14 1238)  simethicone (MYLICON) chewable tablet 80 mg (80 mg Oral Given 08/27/14 1408)  I discussed this patient with Dr. Cathleen Fears who has seen  the patient and recommends simethicone for gas. I will obtain a repeat troponin in 3 hours. Patient is currently stable and in no acute distress or pain. Chest x-ray is negative for pneumonia, edema, pneumothorax. Recheck: Patient is resting comfortably and in no acute distress. She states she has no further chest pain.  Second troponin negative. Patient agrees to follow up with her primary care physician in 3 days. I discussed continuing to take a baby aspirin daily. I've given her return precautions such as diaphoresis, chest pain, shortness of breath. Patient verbally agrees with the plan.    Cynthia Glazier, PA-C 08/27/14 1635  Orlie Dakin, MD 08/27/14 1651

## 2014-08-27 NOTE — ED Notes (Signed)
Pt reported having sudden onset of nonradiating lt sided chest pain with excessive belching. Denies diaphoresis, SHOB, and n/v.

## 2014-08-27 NOTE — ED Provider Notes (Signed)
Patient developed anterior chest pain 10 AM today which was "moving around much in my chest". Symptoms accompanied by belching. Pain is improved with walking around. Nothing made symptoms worse. She treated herself with palms and with aspirin, without relief. Cardiac risk factors include diabetes, hypertension, family history. Mother had MI age 45 father had MI age 29 on exam no distress lungs clear auscultation heart regular rate and rhythm abdomen obese nontender extremity is edema she is belching frequently as I examine her  Orlie Dakin, MD 08/27/14 1600

## 2014-09-13 ENCOUNTER — Other Ambulatory Visit: Payer: Self-pay | Admitting: *Deleted

## 2014-09-16 ENCOUNTER — Telehealth: Payer: Self-pay | Admitting: Hematology and Oncology

## 2014-09-16 NOTE — Telephone Encounter (Signed)
Lft msg for pt confirming labs/ov per 07/19 POF, sent msg to add Iron Infusion.... Mailed schedule to pt... KJ

## 2014-10-03 ENCOUNTER — Encounter: Payer: BLUE CROSS/BLUE SHIELD | Attending: General Surgery

## 2014-10-03 ENCOUNTER — Other Ambulatory Visit: Payer: Self-pay | Admitting: Hematology and Oncology

## 2014-10-03 ENCOUNTER — Telehealth: Payer: Self-pay | Admitting: Hematology and Oncology

## 2014-10-03 ENCOUNTER — Telehealth: Payer: Self-pay | Admitting: *Deleted

## 2014-10-03 ENCOUNTER — Other Ambulatory Visit: Payer: BLUE CROSS/BLUE SHIELD

## 2014-10-03 ENCOUNTER — Other Ambulatory Visit (HOSPITAL_BASED_OUTPATIENT_CLINIC_OR_DEPARTMENT_OTHER): Payer: BLUE CROSS/BLUE SHIELD

## 2014-10-03 DIAGNOSIS — Z713 Dietary counseling and surveillance: Secondary | ICD-10-CM | POA: Diagnosis not present

## 2014-10-03 DIAGNOSIS — D500 Iron deficiency anemia secondary to blood loss (chronic): Secondary | ICD-10-CM | POA: Diagnosis not present

## 2014-10-03 DIAGNOSIS — Z6841 Body Mass Index (BMI) 40.0 and over, adult: Secondary | ICD-10-CM | POA: Insufficient documentation

## 2014-10-03 LAB — CBC & DIFF AND RETIC
BASO%: 0.3 % (ref 0.0–2.0)
Basophils Absolute: 0 10*3/uL (ref 0.0–0.1)
EOS%: 4 % (ref 0.0–7.0)
Eosinophils Absolute: 0.2 10*3/uL (ref 0.0–0.5)
HEMATOCRIT: 39.7 % (ref 34.8–46.6)
HGB: 12.6 g/dL (ref 11.6–15.9)
Immature Retic Fract: 15.5 % — ABNORMAL HIGH (ref 1.60–10.00)
LYMPH%: 40.1 % (ref 14.0–49.7)
MCH: 24.9 pg — ABNORMAL LOW (ref 25.1–34.0)
MCHC: 31.7 g/dL (ref 31.5–36.0)
MCV: 78.5 fL — ABNORMAL LOW (ref 79.5–101.0)
MONO#: 0.5 10*3/uL (ref 0.1–0.9)
MONO%: 7.9 % (ref 0.0–14.0)
NEUT%: 47.7 % (ref 38.4–76.8)
NEUTROS ABS: 2.9 10*3/uL (ref 1.5–6.5)
PLATELETS: 267 10*3/uL (ref 145–400)
RBC: 5.06 10*6/uL (ref 3.70–5.45)
RDW: 14.4 % (ref 11.2–14.5)
RETIC CT ABS: 69.83 10*3/uL (ref 33.70–90.70)
Retic %: 1.38 % (ref 0.70–2.10)
WBC: 6 10*3/uL (ref 3.9–10.3)
lymph#: 2.4 10*3/uL (ref 0.9–3.3)

## 2014-10-03 LAB — IRON AND TIBC CHCC
%SAT: 16 % — AB (ref 21–57)
IRON: 37 ug/dL — AB (ref 41–142)
TIBC: 235 ug/dL — ABNORMAL LOW (ref 236–444)
UIBC: 198 ug/dL (ref 120–384)

## 2014-10-03 LAB — FERRITIN CHCC: Ferritin: 140 ng/ml (ref 9–269)

## 2014-10-03 NOTE — Telephone Encounter (Signed)
-----   Message from Heath Lark, MD sent at 10/03/2014 10:00 AM EDT ----- Regarding: labs better She is no longer anemic with good iron level I recommend cancelling appt this month and recheck in 3 months with labs prior If OK with her let me know and ask for preference what day she wants to see me back and whether morning or afternoon is preferred

## 2014-10-03 NOTE — Telephone Encounter (Signed)
Informed pt of Lab results good and Dr. Calton Dach message below.  She understands she does not need to keep appt later this month to see Dr. Alvy Bimler and for IV iron.  She does ask if she should come back sooner due to having Surgery in Sept.   She is scheduled for a "Bariatric Sleeve" on 11/07/14 and is concerned about blood/iron loss w/ surgery.  She also reports no menstrual cycle in 2 months and worries that once her cycle comes back she might get anemic again.

## 2014-10-03 NOTE — Progress Notes (Signed)
  Pre-Operative Nutrition Class:  Appt start time: 6606   End time:  1830.  Patient was seen on 10/03/2014 for Pre-Operative Bariatric Surgery Education at [facility].   Surgery date:  Surgery type: Sleeve Gastrectomy Start weight at [facility]: 355 lbs on 07/30/14 Weight today: 350 lbs  TANITA  BODY COMP RESULTS  10/03/14   BMI (kg/m^2) 52.4   Fat Mass (lbs) 193   Fat Free Mass (lbs) 157   Total Body Water (lbs) 115    Samples given per MNT protocol. Patient educated on appropriate usage: Celebrate multivitamin chew (orange - qty 1) Lot #: [id number] Exp: 04/2016  Premier protein shake (chocolate - qty 1) Lot #: [id number] Exp: 05/2015  Renee Pain Protein Powder (unflavored - qty 1) Lot #: [id number] Exp: 08/2015  The following the learning objectives were met by the patient during this course:  Identify Pre-Op Dietary Goals and will begin 2 weeks pre-operatively  Identify appropriate sources of fluids and proteins   State protein recommendations and appropriate sources pre and post-operatively  Identify Post-Operative Dietary Goals and will follow for 2 weeks post-operatively  Identify appropriate multivitamin and calcium sources  Describe the need for physical activity post-operatively and will follow MD recommendations  State when to call healthcare provider regarding medication questions or post-operative complications  Handouts given during class include:  Pre-Op Bariatric Surgery Diet Handout  Protein Shake Handout  Post-Op Bariatric Surgery Nutrition Handout  BELT Program Information Flyer  Support Group Information Flyer  [facility] Bariatric Supplements Price List  Follow-Up Plan: Patient will follow-up at [facility] 2 weeks post operatively for diet advancement per MD.

## 2014-10-03 NOTE — Telephone Encounter (Signed)
Pt confirmed lab r/s for today pt isn't feeling well and wants her labs read earlier if possible.Marland Kitchen KJ

## 2014-10-03 NOTE — Telephone Encounter (Signed)
LVM for pt requesting she call nurse back regarding lab results and so we can discuss Dr. Calton Dach message below.

## 2014-10-04 ENCOUNTER — Other Ambulatory Visit: Payer: Self-pay | Admitting: Hematology and Oncology

## 2014-10-04 ENCOUNTER — Telehealth: Payer: Self-pay | Admitting: Hematology and Oncology

## 2014-10-04 DIAGNOSIS — D500 Iron deficiency anemia secondary to blood loss (chronic): Secondary | ICD-10-CM

## 2014-10-04 NOTE — Telephone Encounter (Signed)
lvm for pt regarding to Aug and Sept appt....pt will get new sched at next visit

## 2014-10-04 NOTE — Telephone Encounter (Signed)
I placed POF for labs on 9/1 for recheck

## 2014-10-05 ENCOUNTER — Other Ambulatory Visit: Payer: Self-pay | Admitting: *Deleted

## 2014-10-10 ENCOUNTER — Other Ambulatory Visit: Payer: BLUE CROSS/BLUE SHIELD

## 2014-10-12 ENCOUNTER — Ambulatory Visit: Payer: Self-pay | Admitting: General Surgery

## 2014-10-17 ENCOUNTER — Ambulatory Visit: Payer: BLUE CROSS/BLUE SHIELD

## 2014-10-17 ENCOUNTER — Ambulatory Visit: Payer: BLUE CROSS/BLUE SHIELD | Admitting: Hematology and Oncology

## 2014-10-27 ENCOUNTER — Ambulatory Visit: Payer: Self-pay | Admitting: General Surgery

## 2014-10-27 NOTE — Patient Instructions (Signed)
Cynthia Vazquez  10/27/2014   Your procedure is scheduled on: November 07, 2014  Report to [facility] Main  Entrance take [place]  elevators to 3rd floor to  [place] at AM.  Call this number if you have problems the morning of surgery [phone]   Remember: ONLY 1 PERSON MAY GO WITH YOU TO SHORT STAY TO GET  READY MORNING OF [place].  Do not eat food or drink liquids :After Midnight.     Take these medicines the morning of surgery with A SIP OF WATER: Flonase, Claritin                               You may not have any metal on your body including hair pins and              piercings  Do not wear jewelry, make-up, lotions, powders or perfumes, deodorant             Do not wear nail polish.  Do not shave  48 hours prior to surgery.        Do not bring valuables to the hospital. [place].  Contacts, dentures or bridgework may not be worn into surgery.  Leave suitcase in the car. After surgery it may be brought to your room.        Special Instructions: coughing and deep breathing exercises, leg exercises              Please read over the following fact sheets you were given: _____________________________________________________________________             [facility] - Preparing for Surgery Before surgery, you can play an important role.  Because skin is not sterile, your skin needs to be as free of germs as possible.  You can reduce the number of germs on your skin by washing with CHG (chlorahexidine gluconate) soap before surgery.  CHG is an antiseptic cleaner which kills germs and bonds with the skin to continue killing germs even after washing. Please DO NOT use if you have an allergy to CHG or antibacterial soaps.  If your skin becomes reddened/irritated stop using the CHG and inform your nurse when you arrive at Short Stay. Do not shave (including legs and underarms) for at least 48 hours  prior to the first CHG shower.  You may shave your face/neck. Please follow these instructions carefully:  1.  Shower with CHG Soap the night before surgery and the  morning of Surgery.  2.  If you choose to wash your hair, wash your hair first as usual with your  normal  shampoo.  3.  After you shampoo, rinse your hair and body thoroughly to remove the  shampoo.                           4.  Use CHG as you would any other liquid soap.  You can apply chg directly  to the skin and wash                       Gently with a scrungie or clean washcloth.  5.  Apply the CHG Soap to your body ONLY  FROM THE NECK DOWN.   Do not use on face/ open                           Wound or open sores. Avoid contact with eyes, ears mouth and genitals (private parts).                       Wash face,  Genitals (private parts) with your normal soap.             6.  Wash thoroughly, paying special attention to the area where your surgery  will be performed.  7.  Thoroughly rinse your body with warm water from the neck down.  8.  DO NOT shower/wash with your normal soap after using and rinsing off  the CHG Soap.                9.  Pat yourself dry with a clean towel.            10.  Wear clean pajamas.            11.  Place clean sheets on your bed the night of your first shower and do not  sleep with pets. Day of Surgery : Do not apply any lotions/deodorants the morning of surgery.  Please wear clean clothes to the hospital/surgery center.  FAILURE TO FOLLOW THESE INSTRUCTIONS MAY RESULT IN THE CANCELLATION OF YOUR SURGERY PATIENT SIGNATURE_________________________________  NURSE SIGNATURE__________________________________  ________________________________________________________________________

## 2014-10-27 NOTE — H&P (Signed)
Cynthia Vazquez 10/27/2014 11:20 AM Location: [facility] Patient #: [id number] DOB: [birth year] Married / Language: English / Race: Black or African American Female History of Present Illness Cynthia Hiss M. Oris Staffieri Cynthia Vazquez; 10/27/2014 2:20 PM) Patient words: bariatric.  The patient is a 44 year old female who presents for a preoperative evaluation. She comes in today for her preoperative appointment. She has been approved for laparoscopic sleeve gastrectomy. I initially met her on April 6. Her weight in that time was 365 pounds. She states she did have an episode of right-sided abdominal pain which prompted her to go to the emergency room in early May. She had a negative CT scan. She denies any recurrent episodes of abdominal pain. She denies any heartburn or reflux. She denies any nausea or vomiting recently. She denies any diarrhea. She denies any lightheadedness or dizziness. Her preoperative workup revealed evidence of a small hiatal hernia on her upper GI. Her chest x-ray was within normal limits. Some of her evaluation labs were abnormal. Her hemoglobin A1c level was 7.4. She states that she had a repeat at her primary care physician's office and it was at 6.1. She also was found to have some elevated lipids with a total cholesterol level of 234, LDL level 155, triglyceride level 68, HDL level 65 Problem List/Past Medical Cynthia Curry, Cynthia Vazquez; 10/27/2014 2:21 PM) ESSENTIAL HYPERTENSION WITH GOAL BLOOD PRESSURE LESS THAN 130/80 (401.9  I10) LUMBAR AND SACRAL OSTEOARTHRITIS (721.3  M47.817) OBESITY, MORBID, BMI 50 OR HIGHER (278.01  E66.01) HISTORY OF IRON DEFICIENCY ANEMIA (V12.3  Z86.2) PREDIABETES (790.29  R73.09)  Other Problems Cynthia Curry, Cynthia Vazquez; 10/27/2014 2:21 PM) Unspecified Diagnosis  Past Surgical History Cynthia Curry, Cynthia Vazquez; 10/27/2014 2:21 PM) Oral Surgery  Diagnostic Studies History Cynthia Curry, Cynthia Vazquez; 10/27/2014 2:21 PM) Colonoscopy never Mammogram never Pap  Smear 1-5 years ago  Allergies Cynthia Curry, Cynthia Vazquez; 10/27/2014 2:21 PM) Shellfish-derived Products Augmented Betamethasone Diprop *DERMATOLOGICALS* Augmentin *PENICILLINS*  Medication History Cynthia Curry, Cynthia Vazquez; 10/27/2014 2:21 PM) Lidex (0.05% Cream, External) Active. Flonase (50MCG/ACT Suspension, Nasal) Active. Advil (200MG Tablet, Oral) Active. Lisinopril-Hydrochlorothiazide (20-12.5MG Tablet, Oral) Active. Claritin (10MG Capsule, Oral) Active. Lysteda (650MG Tablet, Oral) Active. Drisdol (50000UNIT Capsule, Oral) Active. Medications Reconciled OxyCODONE HCl (5MG/5ML Solution, 5-10 Milliliter Oral every four hours, as needed, Taken starting 10/27/2014) Active. Ondansetron (4MG Tablet Disperse, 1 (one) Tablet Disperse Oral every six hours, as needed, Taken starting 10/27/2014) Active. NexIUM (40MG Capsule DR, 1 (one) Capsule DR Oral daily, Taken starting 10/27/2014) Active.  Social History Cynthia Curry, Cynthia Vazquez; 10/27/2014 2:21 PM) No drug use Tobacco use Never smoker. Caffeine use Carbonated beverages, Tea. No alcohol use  Family History Cynthia Curry, Cynthia Vazquez; 10/27/2014 2:21 PM) Diabetes Mellitus Father, Mother. Heart Disease Father. Heart disease in female family member before age 70 Hypertension Father, Mother, Son.  Pregnancy / Birth History Cynthia Curry, Cynthia Vazquez; 10/27/2014 2:21 PM) Regular periods Gravida 2 Maternal age 70-25 Para 2 Age at menarche 58 years.     Review of Systems Cynthia Curry, Cynthia Vazquez; 10/27/2014 2:17 00) General Present- Weight Gain and Weight Loss. Not Present- Appetite Loss, Chills, Fatigue, Fever and Night Sweats. Skin Not Present- Change in Wart/Mole, Dryness, Hives, Jaundice, New Lesions, Non-Healing Wounds, Rash and Ulcer. HEENT Present- Seasonal Allergies. Not Present- Earache, Hearing Loss, Hoarseness, Nose Bleed, Oral Ulcers, Ringing in the Ears, Sinus Pain, Sore Throat, Visual Disturbances, Wears glasses/contact lenses and Yellow  Eyes. Breast Not Present- Breast Mass, Breast Pain, Nipple Discharge and Skin  Changes. Cardiovascular Not Present- Chest Pain, Difficulty Breathing Lying Down, Leg Cramps, Palpitations, Rapid Heart Rate, Shortness of Breath and Swelling of Extremities. Gastrointestinal Not Present- Abdominal Pain, Bloating, Bloody Stool, Change in Bowel Habits, Chronic diarrhea, Constipation, Difficulty Swallowing, Excessive gas, Gets full quickly at meals, Hemorrhoids, Indigestion, Nausea, Rectal Pain and Vomiting. Female Genitourinary Not Present- Frequency, Nocturia, Painful Urination, Pelvic Pain and Urgency. Musculoskeletal Present- Back Pain and Joint Pain. Not Present- Joint Stiffness, Muscle Pain, Muscle Weakness and Swelling of Extremities. Neurological Not Present- Decreased Memory, Fainting, Headaches, Numbness, Seizures, Tingling, Tremor, Trouble walking and Weakness. Psychiatric Not Present- Anxiety, Bipolar, Change in Sleep Pattern, Depression, Fearful and Frequent crying. Endocrine Not Present- Cold Intolerance, Excessive Hunger, Hair Changes, Heat Intolerance, Hot flashes and New Diabetes. Hematology Not Present- Easy Bruising, Excessive bleeding, Gland problems, HIV and Persistent Infections.  Vitals (Cynthia Vazquez Cynthia Vazquez; 10/27/2014 11:21 AM) 10/27/2014 11:20 AM Weight: 346.4 lb Height: 68.75in Body Surface Area: 2.76 m Body Mass Index: 51.53 kg/m Temp.: 36F(Temporal)  Pulse: 82 (Regular)  BP: 130/78 (Sitting, Left Arm, Standard)     Physical Exam Cynthia Hiss M. Enna Warwick Cynthia Vazquez; 10/27/2014 2:17 PM)  General Mental Status-Alert. General Appearance-Consistent with stated age. Hydration-Well hydrated. Voice-Normal. Note: morbid obesity   Head and Neck Head-normocephalic, atraumatic with no lesions or palpable masses. Trachea-midline. Thyroid Gland Characteristics - normal size and consistency.  Eye Eyeball - Bilateral-Extraocular movements intact. Sclera/Conjunctiva -  Bilateral-No scleral icterus.  Chest and Lung Exam Chest and lung exam reveals -quiet, even and easy respiratory effort with no use of accessory muscles and on auscultation, normal breath sounds, no adventitious sounds and normal vocal resonance. Inspection Chest Wall - Normal. Back - normal.  Breast - Did not examine.  Cardiovascular Cardiovascular examination reveals -normal heart sounds, regular rate and rhythm with no murmurs and normal pedal pulses bilaterally.  Abdomen Inspection Inspection of the abdomen reveals - No Hernias. Skin - Scar - Note: well healed trocars. Palpation/Percussion Palpation and Percussion of the abdomen reveal - Soft, Non Tender, No Rebound tenderness, No Rigidity (guarding) and No hepatosplenomegaly. Auscultation Auscultation of the abdomen reveals - Bowel sounds normal.  Peripheral Vascular Upper Extremity Palpation - Pulses bilaterally normal.  Neurologic Neurologic evaluation reveals -alert and oriented x 3 with no impairment of recent or remote memory. Mental Status-Normal.  Neuropsychiatric The patient's mood and affect are described as -normal. Judgment and Insight-insight is appropriate concerning matters relevant to self.  Musculoskeletal Normal Exam - Left-Upper Extremity Strength Normal and Lower Extremity Strength Normal. Normal Exam - Right-Upper Extremity Strength Normal and Lower Extremity Strength Normal.  Lymphatic Head & Neck  General Head & Neck Lymphatics: Bilateral - Description - Normal. Axillary - Did not examine. Femoral & Inguinal - Did not examine.    Assessment & Plan Cynthia Hiss M. Jahmiya Guidotti Cynthia Vazquez; 10/27/2014 2:21 PM)  OBESITY, MORBID, BMI 50 OR HIGHER (278.01  E66.01) Impression: We reviewed her preoperative workup. We discussed the finding of a small hiatal hernia. I recommended testing her for a clinically significant hiatal hernia during surgery. If found to have a relevant hiatal hernia during surgery  I recommended proceeding with repair. We discussed what surgical repair would involve. We discussed the importance of the preoperative diet. She is only started this. She has lost approximately 19 pounds since her initial visit. I answered all her additional questions she had about surgery as well as the typical recovery period. She was given prescriptions for postoperative pain, nausea, and heartburn.  Current Plans Pt Education - EMW_preopbariatric Started  OxyCODONE HCl 5MG/5ML, 5-10 Milliliter every four hours, as needed, 200 Milliliter, 10/27/2014, No Refill. Started Ondansetron 4MG, 1 (one) Tablet Disperse every six hours, as needed, #30, 10/27/2014, No Refill. Started NexIUM 40MG, 1 (one) Capsule DR daily, #30, 10/27/2014, No Refill. LUMBAR AND SACRAL OSTEOARTHRITIS (721.3  M47.817) Impression: per pcp  ESSENTIAL HYPERTENSION WITH GOAL BLOOD PRESSURE LESS THAN 130/80 (401.9  I10) Impression: per pcp  HISTORY OF IRON DEFICIENCY ANEMIA (V12.3  Z86.2) Impression: per pcp  PREDIABETES (790.29  R73.09) Impression: Per PCP  [name]. Redmond Pulling, Cynthia Vazquez, FACS General, Bariatric, & Minimally Invasive Surgery [facility], [place]

## 2014-10-28 ENCOUNTER — Encounter (HOSPITAL_COMMUNITY)
Admission: RE | Admit: 2014-10-28 | Discharge: 2014-10-28 | Disposition: A | Discharge status: Home / Self Care | Payer: BLUE CROSS/BLUE SHIELD | Source: Ambulatory Visit | Attending: General Surgery | Admitting: General Surgery

## 2014-10-28 ENCOUNTER — Encounter (HOSPITAL_COMMUNITY): Payer: Self-pay

## 2014-10-28 DIAGNOSIS — Z6841 Body Mass Index (BMI) 40.0 and over, adult: Secondary | ICD-10-CM | POA: Insufficient documentation

## 2014-10-28 DIAGNOSIS — Z01812 Encounter for preprocedural laboratory examination: Secondary | ICD-10-CM | POA: Insufficient documentation

## 2014-10-28 HISTORY — DX: Personal history of other diseases of the digestive system: Z87.19

## 2014-10-28 LAB — CBC WITH DIFFERENTIAL/PLATELET
BASOS PCT: 0 % (ref 0–1)
Basophils Absolute: 0 10*3/uL (ref 0.0–0.1)
EOS ABS: 0.1 10*3/uL (ref 0.0–0.7)
Eosinophils Relative: 2 % (ref 0–5)
HEMATOCRIT: 41.7 % (ref 36.0–46.0)
HEMOGLOBIN: 13 g/dL (ref 12.0–15.0)
Lymphocytes Relative: 35 % (ref 12–46)
Lymphs Abs: 2.1 10*3/uL (ref 0.7–4.0)
MCH: 24.4 pg — ABNORMAL LOW (ref 26.0–34.0)
MCHC: 31.2 g/dL (ref 30.0–36.0)
MCV: 78.2 fL (ref 78.0–100.0)
Monocytes Absolute: 0.6 10*3/uL (ref 0.1–1.0)
Monocytes Relative: 10 % (ref 3–12)
NEUTROS ABS: 3.2 10*3/uL (ref 1.7–7.7)
NEUTROS PCT: 53 % (ref 43–77)
Platelets: 307 10*3/uL (ref 150–400)
RBC: 5.33 MIL/uL — AB (ref 3.87–5.11)
RDW: 14.8 % (ref 11.5–15.5)
WBC: 6.1 10*3/uL (ref 4.0–10.5)

## 2014-10-28 LAB — COMPREHENSIVE METABOLIC PANEL
ALBUMIN: 4 g/dL (ref 3.5–5.0)
ALK PHOS: 93 U/L (ref 38–126)
ALT: 21 U/L (ref 14–54)
AST: 25 U/L (ref 15–41)
Anion gap: 8 (ref 5–15)
BUN: 13 mg/dL (ref 6–20)
CALCIUM: 9.1 mg/dL (ref 8.9–10.3)
CO2: 24 mmol/L (ref 22–32)
CREATININE: 0.63 mg/dL (ref 0.44–1.00)
Chloride: 103 mmol/L (ref 101–111)
GFR calc Af Amer: >60 mL/min (ref >60)
GFR calc non Af Amer: >60 mL/min (ref >60)
GLUCOSE: 102 mg/dL — AB (ref 65–99)
Potassium: 4 mmol/L (ref 3.5–5.1)
SODIUM: 135 mmol/L (ref 135–145)
Total Bilirubin: 0.7 mg/dL (ref 0.3–1.2)
Total Protein: 8.3 g/dL — ABNORMAL HIGH (ref 6.5–8.1)

## 2014-10-28 LAB — HCG, SERUM, QUALITATIVE: Preg, Serum: NEGATIVE

## 2014-10-28 NOTE — Progress Notes (Signed)
08-28-14 - EKG - EPIC 08-27-14 - 2V CXR - EPIC 07-13-14 - UGI w/KUB - EPIC 07-01-14 - CT ABD/Pelvis - EPIC 05-09-14 - LOV - Dr. Alvy Bimler (oncologist) - EPIC

## 2014-10-28 NOTE — Progress Notes (Signed)
   10/28/14 0900  OBSTRUCTIVE SLEEP APNEA  Have you ever been diagnosed with sleep apnea through a sleep study? No  Do you snore loudly (loud enough to be heard through closed doors)?  0  Do you often feel tired, fatigued, or sleepy during the daytime? 1  Has anyone observed you stop breathing during your sleep? 0  Do you have, or are you being treated for high blood pressure? 1  BMI more than 35 kg/m2? 1  Age over 45 years old? 0  Neck circumference greater than 40 cm/16 inches? 1  Gender: 0  Obstructive Sleep Apnea Score 4

## 2014-10-31 ENCOUNTER — Emergency Department (HOSPITAL_COMMUNITY)
Admission: EM | Admit: 2014-10-31 | Discharge: 2014-10-31 | Disposition: A | Discharge status: Home / Self Care | Payer: BLUE CROSS/BLUE SHIELD | Attending: Emergency Medicine | Admitting: Emergency Medicine

## 2014-10-31 ENCOUNTER — Encounter (HOSPITAL_COMMUNITY): Payer: Self-pay | Admitting: *Deleted

## 2014-10-31 DIAGNOSIS — Z79899 Other long term (current) drug therapy: Secondary | ICD-10-CM | POA: Diagnosis not present

## 2014-10-31 DIAGNOSIS — K5901 Slow transit constipation: Secondary | ICD-10-CM

## 2014-10-31 DIAGNOSIS — K5900 Constipation, unspecified: Secondary | ICD-10-CM | POA: Diagnosis present

## 2014-10-31 DIAGNOSIS — Z7982 Long term (current) use of aspirin: Secondary | ICD-10-CM | POA: Insufficient documentation

## 2014-10-31 DIAGNOSIS — Z9049 Acquired absence of other specified parts of digestive tract: Secondary | ICD-10-CM | POA: Insufficient documentation

## 2014-10-31 DIAGNOSIS — Z9851 Tubal ligation status: Secondary | ICD-10-CM | POA: Insufficient documentation

## 2014-10-31 DIAGNOSIS — Z862 Personal history of diseases of the blood and blood-forming organs and certain disorders involving the immune mechanism: Secondary | ICD-10-CM | POA: Diagnosis not present

## 2014-10-31 DIAGNOSIS — I10 Essential (primary) hypertension: Secondary | ICD-10-CM | POA: Diagnosis not present

## 2014-10-31 DIAGNOSIS — E119 Type 2 diabetes mellitus without complications: Secondary | ICD-10-CM | POA: Insufficient documentation

## 2014-10-31 MED ORDER — POLYETHYLENE GLYCOL 3350 17 GM/SCOOP PO POWD: 17.0000 g | Freq: Every day | ORAL | Status: DC

## 2014-10-31 NOTE — ED Notes (Signed)
Soap suds enema performed. 250cc inserted. Pt given bedside commode and told where bathroom was and given call bell.

## 2014-10-31 NOTE — ED Notes (Signed)
Pt ambulatory to bathroom

## 2014-10-31 NOTE — ED Notes (Signed)
Patient was alert, oriented and stable upon discharge. RN went over AVS and patient had no further questions.  

## 2014-10-31 NOTE — ED Provider Notes (Signed)
CSN: [id number]     Arrival date & time 10/31/14  1704 History   First MD Initiated Contact with Patient 10/31/14 1953     Chief Complaint  Patient presents with  . Constipation     (Consider location/radiation/quality/duration/timing/severity/associated sxs/prior Treatment) HPI Comments: This a morbidly obese female, who is awaiting gastric sleeve surgery and approximate 1 week.  She was placed on a high-protein diet approximately 3 weeks ago for the past 2 days.  She's had no stool production.  She feels uncomfortable.  She tried taking milk of magnesia approximately 3 hours ago with no results  Patient is a 44 y.o. female presenting with constipation. The history is provided by the patient.  Constipation Severity:  Severe Time since last bowel movement:  2 days Timing:  Constant Progression:  Worsening Chronicity:  New Context: dietary changes   Stool description:  None produced Unusual stool frequency:  Daily Relieved by:  None tried Worsened by:  Nothing tried Ineffective treatments:  Laxatives (Milk of magnesia 1 proximally, 3 hours ago) Associated symptoms: flatus   Associated symptoms: no abdominal pain, no back pain, no diarrhea, no fever, no nausea and no vomiting   Risk factors: obesity     Past Medical History  Diagnosis Date  . Hypertension   . Iron deficiency anemia due to chronic blood loss     menstrual cycles  . Diabetes mellitus without complication   . History of hiatal hernia    Past Surgical History  Procedure Laterality Date  . Cholecystectomy    . Cesarean section    . Tubal ligation    . Breath tek h pylori N/A 07/22/2014    Procedure: BREATH TEK H PYLORI;  Surgeon: Greer Pickerel, MD;  Location: [facility];  Service: General;  Laterality: N/A;   Family History  Problem Relation Age of Onset  . Anemia Mother   . Anemia Paternal Grandfather    Social History  Substance Use Topics  . Smoking status: Never Smoker   . Smokeless tobacco: Never  Used  . Alcohol Use: No   OB History    No data available     Review of Systems  Constitutional: Negative for fever and chills.  Gastrointestinal: Positive for constipation and flatus. Negative for nausea, vomiting, abdominal pain, diarrhea and abdominal distention.  Musculoskeletal: Negative for back pain.  All other systems reviewed and are negative.     Allergies  Shellfish allergy and Augmentin  Home Medications   Prior to Admission medications   Medication Sig Start Date End Date Taking? Authorizing Provider  aspirin 81 MG chewable tablet Chew 81 mg by mouth every morning.   Yes Historical Provider, MD  cholecalciferol (VITAMIN D) 1000 UNITS tablet Take 1,000 Units by mouth daily as needed (low vit d levels).    Yes Historical Provider, MD  fluocinonide (LIDEX) 0.05 % external solution Apply 1 application topically 2 (two) times daily as needed (for itchy scalp).  07/16/14  Yes Historical Provider, MD  fluocinonide ointment (LIDEX) 8.85 % Apply 1 application topically 2 (two) times daily as needed (itchy scalp).  04/29/14  Yes Historical Provider, MD  fluticasone (FLONASE) 50 MCG/ACT nasal spray Place 2 sprays into both nostrils daily. Patient taking differently: Place 2 sprays into both nostrils daily as needed.  05/14/14  Yes Wandra Arthurs, MD  glimepiride (AMARYL) 2 MG tablet Take 1 tablet by mouth every morning. With largest meal 06/14/14  Yes Historical Provider, MD  ibuprofen (ADVIL,MOTRIN) 200 MG tablet Take  400 mg by mouth every 6 (six) hours as needed for headache or moderate pain.    Yes Historical Provider, MD  lisinopril-hydrochlorothiazide (PRINZIDE,ZESTORETIC) 20-12.5 MG per tablet Take 1 tablet by mouth every morning.    Yes Historical Provider, MD  loratadine (CLARITIN) 10 MG tablet Take 10 mg by mouth daily as needed for allergies.    Yes Historical Provider, MD  tranexamic acid (LYSTEDA) 650 MG TABS tablet Take 1,300 mg by mouth 3 (three) times daily. For five days  during menstrual cycle.   Yes Historical Provider, MD  polyethylene glycol powder (GLYCOLAX/MIRALAX) powder Take 17 g by mouth daily. 10/31/14   Junius Creamer, NP   BP 136/87 mmHg  Pulse 92  Temp(Src) 98 F (36.7 C) (Oral)  Resp 17  Wt 347 lb (157.398 kg)  SpO2 99%  LMP 09/25/2014 (Approximate) Physical Exam  Constitutional: She appears well-developed and well-nourished.  HENT:  Head: Normocephalic.  Eyes: Pupils are equal, round, and reactive to light.  Neck: Normal range of motion.  Cardiovascular: Normal rate and regular rhythm.   Pulmonary/Chest: Effort normal and breath sounds normal.  Abdominal: Soft.  Exam hindered by body habitus  Genitourinary:  Exam shows there is no stool in the rectal vault.  There is liquid feces in the anus  Musculoskeletal: Normal range of motion. She exhibits no edema or tenderness.  Neurological: She is alert.  Skin: Skin is warm.  Nursing note and vitals reviewed.   ED Course  Procedures (including critical care time) Labs Review Labs Reviewed - No data to display  Imaging Review No results found. I have personally reviewed and evaluated these images and lab results as part of my medical decision-making.   EKG Interpretation None     Patient had a large bowel movement after subsequent dinner, feels much better.  She'll be discharge him with instructions to add Maalox to her protein shakes 1 capsule twice a day until she starts doing on a regular basis, then 1 capful daily or every other day depending on need MDM   Final diagnoses:  Slow transit constipation         Junius Creamer, NP 10/31/14 2103  Leo Grosser, MD 11/02/14 1106

## 2014-10-31 NOTE — Discharge Instructions (Signed)
Constipation °Constipation is when a person has fewer than three bowel movements a week, has difficulty having a bowel movement, or has stools that are dry, hard, or larger than normal. As people grow older, constipation is more common. If you try to fix constipation with medicines that make you have a bowel movement (laxatives), the problem may get worse. Long-term laxative use may cause the muscles of the colon to become weak. A low-fiber diet, not taking in enough fluids, and taking certain medicines may make constipation worse.  °CAUSES  °· Certain medicines, such as antidepressants, pain medicine, iron supplements, antacids, and water pills.   °· Certain diseases, such as diabetes, irritable bowel syndrome (IBS), thyroid disease, or depression.   °· Not drinking enough water.   °· Not eating enough fiber-rich foods.   °· Stress or travel.   °· Lack of physical activity or exercise.   °· Ignoring the urge to have a bowel movement.   °· Using laxatives too much.   °SIGNS AND SYMPTOMS  °· Having fewer than three bowel movements a week.   °· Straining to have a bowel movement.   °· Having stools that are hard, dry, or larger than normal.   °· Feeling full or bloated.   °· Pain in the lower abdomen.   °· Not feeling relief after having a bowel movement.   °DIAGNOSIS  °Your health care provider will take a medical history and perform a physical exam. Further testing may be done for severe constipation. Some tests may include: °· A barium enema X-ray to examine your rectum, colon, and, sometimes, your small intestine.   °· A sigmoidoscopy to examine your lower colon.   °· A colonoscopy to examine your entire colon. °TREATMENT  °Treatment will depend on the severity of your constipation and what is causing it. Some dietary treatments include drinking more fluids and eating more fiber-rich foods. Lifestyle treatments may include regular exercise. If these diet and lifestyle recommendations do not help, your health care  provider may recommend taking over-the-counter laxative medicines to help you have bowel movements. Prescription medicines may be prescribed if over-the-counter medicines do not work.  °HOME CARE INSTRUCTIONS  °· Eat foods that have a lot of fiber, such as fruits, vegetables, whole grains, and beans. °· Limit foods high in fat and processed sugars, such as french fries, hamburgers, cookies, candies, and soda.   °· A fiber supplement may be added to your diet if you cannot get enough fiber from foods.   °· Drink enough fluids to keep your urine clear or pale yellow.   °· Exercise regularly or as directed by your health care provider.   °· Go to the restroom when you have the urge to go. Do not hold it.   °· Only take over-the-counter or prescription medicines as directed by your health care provider. Do not take other medicines for constipation without talking to your health care provider first.   °SEEK IMMEDIATE MEDICAL CARE IF:  °· You have bright red blood in your stool.   °· Your constipation lasts for more than 4 days or gets worse.   °· You have abdominal or rectal pain.   °· You have thin, pencil-like stools.   °· You have unexplained weight loss. °MAKE SURE YOU:  °· Understand these instructions. °· Will watch your condition. °· Will get help right away if you are not doing well or get worse. °Document Released: 11/10/2003 Document Revised: 02/16/2013 Document Reviewed: 11/23/2012 °Exit[employer]® Patient Information ©2015 [employer]. This information is not intended to replace advice given to you by your health care provider. Make sure you discuss any questions   you have with your health care provider. As discussed, at one Mira lax to your protein shakes twice a day he starts stooling on a regular basis, then 1 capful daily or every other day depending on your

## 2014-10-31 NOTE — ED Notes (Addendum)
Pt reports she is going to have gastric sleeve, so doctor put her on high protein diet, now pt is constipated. Last bowel movement 2 days ago. Pt reports stool is stuck in her rectum. Denies pain. Took milk of magnesia with no relief.

## 2014-11-01 ENCOUNTER — Telehealth: Payer: Self-pay | Admitting: *Deleted

## 2014-11-01 NOTE — Telephone Encounter (Signed)
Called patient after receipt of voicemail that she is "scheduled for lab, F/U and iron infusions 11-03-2014, 11-10-2014 and 11-17-2014.  All of which should have been rescheduled to after surgery.  Surgery scheduled for 11-07-2014 with two day-admission and two to four weeks healing time.  Would like to be checked after surgery because her period may return after surgery."  Observed 10-03-2014 phone notes which read to cancel these appointments three months out.  "I just had labs 10-28-2014 and nothing has changed."  Will notify Dr. Alvy Bimler to advise on recent lab results and P.O.F. For schedulers to reschedule F/U and Feraheme.

## 2014-11-02 NOTE — Telephone Encounter (Signed)
It is hard to predict when to bring her back I suggest she calls [place] when she felt ready and I can place more orders then I cancelled her future appt

## 2014-11-02 NOTE — Telephone Encounter (Signed)
Called patient with provider instructions.  Cynthia Vazquez will call but says she would like to be seen the week after surgery since she will be out of work.  "September 27, 28 already scheduled for appointments with surgeon so these dates are not open."

## 2014-11-03 ENCOUNTER — Other Ambulatory Visit: Payer: BLUE CROSS/BLUE SHIELD

## 2014-11-04 ENCOUNTER — Other Ambulatory Visit: Payer: Self-pay | Admitting: *Deleted

## 2014-11-06 MED ORDER — LEVOFLOXACIN IN D5W 750 MG/150ML IV SOLN
750.0000 mg | INTRAVENOUS | Status: AC
  Administered 2014-11-07: 750 mg via INTRAVENOUS
  Filled 2014-11-06 (×2): qty 150

## 2014-11-07 ENCOUNTER — Encounter (HOSPITAL_COMMUNITY): Payer: Self-pay | Admitting: *Deleted

## 2014-11-07 ENCOUNTER — Inpatient Hospital Stay (HOSPITAL_COMMUNITY): Payer: BLUE CROSS/BLUE SHIELD | Admitting: Certified Registered Nurse Anesthetist

## 2014-11-07 ENCOUNTER — Inpatient Hospital Stay (HOSPITAL_COMMUNITY)
Admission: RE | Admit: 2014-11-07 | Discharge: 2014-11-09 | DRG: 621 | Disposition: A | Discharge status: Home / Self Care | Payer: BLUE CROSS/BLUE SHIELD | Source: Ambulatory Visit | Attending: General Surgery | Admitting: General Surgery

## 2014-11-07 ENCOUNTER — Encounter (HOSPITAL_COMMUNITY)
Admission: RE | Disposition: A | Discharge status: Home / Self Care | Payer: Self-pay | Source: Ambulatory Visit | Attending: General Surgery

## 2014-11-07 DIAGNOSIS — R7309 Other abnormal glucose: Secondary | ICD-10-CM | POA: Diagnosis present

## 2014-11-07 DIAGNOSIS — K660 Peritoneal adhesions (postprocedural) (postinfection): Secondary | ICD-10-CM | POA: Diagnosis present

## 2014-11-07 DIAGNOSIS — Z833 Family history of diabetes mellitus: Secondary | ICD-10-CM | POA: Diagnosis not present

## 2014-11-07 DIAGNOSIS — Z8249 Family history of ischemic heart disease and other diseases of the circulatory system: Secondary | ICD-10-CM

## 2014-11-07 DIAGNOSIS — Z9884 Bariatric surgery status: Secondary | ICD-10-CM

## 2014-11-07 DIAGNOSIS — Z79899 Other long term (current) drug therapy: Secondary | ICD-10-CM | POA: Diagnosis not present

## 2014-11-07 DIAGNOSIS — I10 Essential (primary) hypertension: Secondary | ICD-10-CM | POA: Diagnosis present

## 2014-11-07 DIAGNOSIS — R7303 Prediabetes: Secondary | ICD-10-CM | POA: Diagnosis present

## 2014-11-07 DIAGNOSIS — M1990 Unspecified osteoarthritis, unspecified site: Secondary | ICD-10-CM | POA: Diagnosis present

## 2014-11-07 DIAGNOSIS — R110 Nausea: Secondary | ICD-10-CM | POA: Diagnosis not present

## 2014-11-07 DIAGNOSIS — Z6841 Body Mass Index (BMI) 40.0 and over, adult: Secondary | ICD-10-CM | POA: Diagnosis not present

## 2014-11-07 DIAGNOSIS — K449 Diaphragmatic hernia without obstruction or gangrene: Secondary | ICD-10-CM | POA: Diagnosis present

## 2014-11-07 DIAGNOSIS — Z01812 Encounter for preprocedural laboratory examination: Secondary | ICD-10-CM | POA: Diagnosis not present

## 2014-11-07 DIAGNOSIS — M47817 Spondylosis without myelopathy or radiculopathy, lumbosacral region: Secondary | ICD-10-CM | POA: Diagnosis present

## 2014-11-07 HISTORY — PX: UPPER GI ENDOSCOPY: SHX6162

## 2014-11-07 HISTORY — PX: LAPAROSCOPIC GASTRIC SLEEVE RESECTION: SHX5895

## 2014-11-07 LAB — GLUCOSE, CAPILLARY
Glucose-Capillary: 96 mg/dL (ref 65–99)
Glucose-Capillary: 129 mg/dL — ABNORMAL HIGH (ref 65–99)

## 2014-11-07 LAB — HEMOGLOBIN AND HEMATOCRIT, BLOOD
HCT: 38.4 % (ref 36.0–46.0)
HEMOGLOBIN: 12.5 g/dL (ref 12.0–15.0)

## 2014-11-07 SURGERY — GASTRECTOMY, SLEEVE, LAPAROSCOPIC
Anesthesia: General | Site: Abdomen

## 2014-11-07 MED ORDER — PROMETHAZINE HCL 25 MG/ML IJ SOLN
12.5000 mg | Freq: Four times a day (QID) | INTRAMUSCULAR | Status: DC | PRN
  Administered 2014-11-08 (×2): 12.5 mg via INTRAVENOUS
  Filled 2014-11-07 (×2): qty 1

## 2014-11-07 MED ORDER — ENOXAPARIN SODIUM 30 MG/0.3ML ~~LOC~~ SOLN
30.0000 mg | Freq: Two times a day (BID) | SUBCUTANEOUS | Status: DC
Start: 2014-11-08 — End: 2014-11-09
  Administered 2014-11-08 – 2014-11-09 (×3): 30 mg via SUBCUTANEOUS
  Filled 2014-11-07 (×5): qty 0.3

## 2014-11-07 MED ORDER — PROPOFOL 10 MG/ML IV BOLUS
INTRAVENOUS | Status: AC
  Filled 2014-11-07: qty 20

## 2014-11-07 MED ORDER — CHLORHEXIDINE GLUCONATE 4 % EX LIQD
60.0000 mL | Freq: Once | CUTANEOUS | Status: DC
Start: 2014-11-08 — End: 2014-11-07

## 2014-11-07 MED ORDER — INSULIN ASPART 100 UNIT/ML ~~LOC~~ SOLN: 0.0000 [IU] | SUBCUTANEOUS | Status: DC

## 2014-11-07 MED ORDER — MORPHINE SULFATE (PF) 2 MG/ML IV SOLN
2.0000 mg | INTRAVENOUS | Status: DC | PRN
  Administered 2014-11-07 – 2014-11-08 (×3): 2 mg via INTRAVENOUS
  Filled 2014-11-07 (×3): qty 1

## 2014-11-07 MED ORDER — CHLORHEXIDINE GLUCONATE 4 % EX LIQD: 60.0000 mL | Freq: Once | CUTANEOUS | Status: DC

## 2014-11-07 MED ORDER — LACTATED RINGERS IR SOLN
Status: DC | PRN
  Administered 2014-11-07: 1000 mL

## 2014-11-07 MED ORDER — MEPERIDINE HCL 50 MG/ML IJ SOLN: 6.2500 mg | INTRAMUSCULAR | Status: DC | PRN

## 2014-11-07 MED ORDER — UNJURY CHICKEN SOUP POWDER: 2.0000 [oz_av] | Freq: Four times a day (QID) | ORAL | Status: DC

## 2014-11-07 MED ORDER — MIDAZOLAM HCL 5 MG/5ML IJ SOLN
INTRAMUSCULAR | Status: DC | PRN
  Administered 2014-11-07: 2 mg via INTRAVENOUS

## 2014-11-07 MED ORDER — ONDANSETRON HCL 4 MG/2ML IJ SOLN
INTRAMUSCULAR | Status: DC | PRN
  Administered 2014-11-07: 4 mg via INTRAVENOUS

## 2014-11-07 MED ORDER — BUPIVACAINE LIPOSOME 1.3 % IJ SUSP
20.0000 mL | Freq: Once | INTRAMUSCULAR | Status: DC
  Filled 2014-11-07: qty 20

## 2014-11-07 MED ORDER — ACETAMINOPHEN 10 MG/ML IV SOLN
1000.0000 mg | Freq: Once | INTRAVENOUS | Status: AC
  Administered 2014-11-07: 1000 mg via INTRAVENOUS

## 2014-11-07 MED ORDER — OXYCODONE HCL 5 MG/5ML PO SOLN
5.0000 mg | ORAL | Status: DC | PRN
  Administered 2014-11-08 – 2014-11-09 (×2): 5 mg via ORAL
  Filled 2014-11-07 (×2): qty 5

## 2014-11-07 MED ORDER — PROMETHAZINE HCL 25 MG/ML IJ SOLN
6.2500 mg | INTRAMUSCULAR | Status: AC | PRN
Start: 2014-11-07 — End: 2014-11-07
  Administered 2014-11-07 (×2): 6.25 mg via INTRAVENOUS

## 2014-11-07 MED ORDER — FENTANYL CITRATE (PF) 250 MCG/5ML IJ SOLN
INTRAMUSCULAR | Status: AC
  Filled 2014-11-07: qty 25

## 2014-11-07 MED ORDER — UNJURY VANILLA POWDER: 2.0000 [oz_av] | Freq: Four times a day (QID) | ORAL | Status: DC

## 2014-11-07 MED ORDER — ACETAMINOPHEN 10 MG/ML IV SOLN
INTRAVENOUS | Status: AC
  Filled 2014-11-07: qty 100

## 2014-11-07 MED ORDER — 0.9 % SODIUM CHLORIDE (POUR BTL) OPTIME
TOPICAL | Status: DC | PRN
  Administered 2014-11-07: 1000 mL

## 2014-11-07 MED ORDER — HYDROMORPHONE HCL 1 MG/ML IJ SOLN
0.2500 mg | INTRAMUSCULAR | Status: DC | PRN
  Administered 2014-11-07 (×2): 0.5 mg via INTRAVENOUS

## 2014-11-07 MED ORDER — ACETAMINOPHEN 10 MG/ML IV SOLN
1000.0000 mg | Freq: Four times a day (QID) | INTRAVENOUS | Status: AC
  Administered 2014-11-07 – 2014-11-08 (×4): 1000 mg via INTRAVENOUS
  Filled 2014-11-07 (×5): qty 100

## 2014-11-07 MED ORDER — ACETAMINOPHEN 160 MG/5ML PO SOLN: 650.0000 mg | ORAL | Status: DC | PRN

## 2014-11-07 MED ORDER — LACTATED RINGERS IV SOLN
INTRAVENOUS | Status: DC
Start: 2014-11-07 — End: 2014-11-07

## 2014-11-07 MED ORDER — PROMETHAZINE HCL 25 MG/ML IJ SOLN
INTRAMUSCULAR | Status: AC
  Filled 2014-11-07: qty 1

## 2014-11-07 MED ORDER — SUCCINYLCHOLINE CHLORIDE 20 MG/ML IJ SOLN
INTRAMUSCULAR | Status: DC | PRN
  Administered 2014-11-07: 140 mg via INTRAVENOUS

## 2014-11-07 MED ORDER — HEPARIN SODIUM (PORCINE) 5000 UNIT/ML IJ SOLN
5000.0000 [IU] | INTRAMUSCULAR | Status: AC
  Administered 2014-11-07: 5000 [IU] via SUBCUTANEOUS
  Filled 2014-11-07: qty 1

## 2014-11-07 MED ORDER — LACTATED RINGERS IV SOLN
INTRAVENOUS | Status: DC
  Administered 2014-11-07: 16:00:00 via INTRAVENOUS
  Administered 2014-11-07: 1000 mL via INTRAVENOUS

## 2014-11-07 MED ORDER — ONDANSETRON HCL 4 MG/2ML IJ SOLN
4.0000 mg | INTRAMUSCULAR | Status: DC | PRN
  Administered 2014-11-07 – 2014-11-08 (×4): 4 mg via INTRAVENOUS
  Filled 2014-11-07 (×4): qty 2

## 2014-11-07 MED ORDER — PROPOFOL 10 MG/ML IV BOLUS
INTRAVENOUS | Status: DC | PRN
  Administered 2014-11-07: 250 mg via INTRAVENOUS

## 2014-11-07 MED ORDER — ACETAMINOPHEN 160 MG/5ML PO SOLN: 325.0000 mg | ORAL | Status: DC | PRN

## 2014-11-07 MED ORDER — SODIUM CHLORIDE 0.9 % IJ SOLN
INTRAMUSCULAR | Status: AC
  Filled 2014-11-07: qty 50

## 2014-11-07 MED ORDER — POTASSIUM CHLORIDE IN NACL 20-0.45 MEQ/L-% IV SOLN
INTRAVENOUS | Status: DC
  Administered 2014-11-07 – 2014-11-09 (×4): via INTRAVENOUS
  Filled 2014-11-07 (×11): qty 1000

## 2014-11-07 MED ORDER — NEOSTIGMINE METHYLSULFATE 10 MG/10ML IV SOLN
INTRAVENOUS | Status: DC | PRN
  Administered 2014-11-07: 5 mg via INTRAVENOUS

## 2014-11-07 MED ORDER — SODIUM CHLORIDE 0.9 % IJ SOLN
INTRAMUSCULAR | Status: DC | PRN
  Administered 2014-11-07: 50 mL via INTRAVENOUS

## 2014-11-07 MED ORDER — HYDROMORPHONE HCL 2 MG/ML IJ SOLN
INTRAMUSCULAR | Status: AC
  Filled 2014-11-07: qty 1

## 2014-11-07 MED ORDER — UNJURY CHOCOLATE CLASSIC POWDER
2.0000 [oz_av] | Freq: Four times a day (QID) | ORAL | Status: DC
  Administered 2014-11-09: 2 [oz_av] via ORAL

## 2014-11-07 MED ORDER — PANTOPRAZOLE SODIUM 40 MG IV SOLR
40.0000 mg | Freq: Every day | INTRAVENOUS | Status: DC
  Administered 2014-11-07 – 2014-11-08 (×2): 40 mg via INTRAVENOUS
  Filled 2014-11-07 (×3): qty 40

## 2014-11-07 MED ORDER — MIDAZOLAM HCL 2 MG/2ML IJ SOLN
INTRAMUSCULAR | Status: AC
  Filled 2014-11-07: qty 4

## 2014-11-07 MED ORDER — FENTANYL CITRATE (PF) 100 MCG/2ML IJ SOLN
INTRAMUSCULAR | Status: DC | PRN
  Administered 2014-11-07: 100 ug via INTRAVENOUS
  Administered 2014-11-07: 50 ug via INTRAVENOUS
  Administered 2014-11-07: 100 ug via INTRAVENOUS

## 2014-11-07 MED ORDER — ROCURONIUM BROMIDE 100 MG/10ML IV SOLN
INTRAVENOUS | Status: DC | PRN
  Administered 2014-11-07: 50 mg via INTRAVENOUS
  Administered 2014-11-07: 20 mg via INTRAVENOUS

## 2014-11-07 MED ORDER — LIDOCAINE HCL (CARDIAC) 20 MG/ML IV SOLN
INTRAVENOUS | Status: DC | PRN
  Administered 2014-11-07: 100 mg via INTRAVENOUS

## 2014-11-07 MED ORDER — GLYCOPYRROLATE 0.2 MG/ML IJ SOLN
INTRAMUSCULAR | Status: DC | PRN
  Administered 2014-11-07: .7 mg via INTRAVENOUS

## 2014-11-07 MED ORDER — HYDROMORPHONE HCL 1 MG/ML IJ SOLN
INTRAMUSCULAR | Status: AC
  Filled 2014-11-07: qty 1

## 2014-11-07 MED ORDER — BUPIVACAINE LIPOSOME 1.3 % IJ SUSP
INTRAMUSCULAR | Status: DC | PRN
  Administered 2014-11-07: 20 mL

## 2014-11-07 MED ORDER — HYDROMORPHONE HCL 1 MG/ML IJ SOLN
INTRAMUSCULAR | Status: DC | PRN
  Administered 2014-11-07: 0.5 mg via INTRAVENOUS

## 2014-11-07 SURGICAL SUPPLY — 58 items
APPLICATOR COTTON TIP 6IN STRL (MISCELLANEOUS) IMPLANT
APPLIER CLIP ROT 10 11.4 M/L (STAPLE)
BLADE SURG SZ11 CARB STEEL (BLADE) ×3 IMPLANT
CABLE HIGH FREQUENCY MONO STRZ (ELECTRODE) ×3 IMPLANT
CHLORAPREP W/TINT 26ML (MISCELLANEOUS) ×6 IMPLANT
CLIP APPLIE ROT 10 11.4 M/L (STAPLE) IMPLANT
COVER SURGICAL LIGHT HANDLE (MISCELLANEOUS) IMPLANT
DERMABOND ADVANCED (GAUZE/BANDAGES/DRESSINGS) ×1
DERMABOND ADVANCED .7 DNX12 (GAUZE/BANDAGES/DRESSINGS) ×2 IMPLANT
DEVICE SUT QUICK LOAD TK 5 (STAPLE) ×3 IMPLANT
DEVICE SUT TI-KNOT TK 5X26 (MISCELLANEOUS) ×3 IMPLANT
DEVICE SUTURE ENDOST 10MM (ENDOMECHANICALS) ×3 IMPLANT
DEVICE TROCAR PUNCTURE CLOSURE (ENDOMECHANICALS) ×3 IMPLANT
DRAPE CAMERA CLOSED 9X96 (DRAPES) ×3 IMPLANT
DRAPE UTILITY XL STRL (DRAPES) ×6 IMPLANT
ELECT REM PT RETURN 9FT ADLT (ELECTROSURGICAL) ×3
ELECTRODE REM PT RTRN 9FT ADLT (ELECTROSURGICAL) ×2 IMPLANT
GAUZE SPONGE 4X4 12PLY STRL (GAUZE/BANDAGES/DRESSINGS) IMPLANT
GLOVE BIOGEL M STRL SZ7.5 (GLOVE) ×3 IMPLANT
GOWN STRL REUS W/TWL XL LVL3 (GOWN DISPOSABLE) ×12 IMPLANT
HOVERMATT SINGLE USE (MISCELLANEOUS) ×3 IMPLANT
KIT BASIN OR (CUSTOM PROCEDURE TRAY) ×3 IMPLANT
NEEDLE SPNL 22GX3.5 QUINCKE BK (NEEDLE) ×3 IMPLANT
PACK UNIVERSAL I (CUSTOM PROCEDURE TRAY) ×3 IMPLANT
PEN SKIN MARKING BROAD (MISCELLANEOUS) ×3 IMPLANT
RELOAD STAPLER BLUE 60MM (STAPLE) ×2 IMPLANT
RELOAD STAPLER GOLD 60MM (STAPLE) ×2 IMPLANT
RELOAD STAPLER GREEN 60MM (STAPLE) ×6 IMPLANT
SCISSORS LAP 5X45 EPIX DISP (ENDOMECHANICALS) ×3 IMPLANT
SEALANT SURGICAL APPL DUAL CAN (MISCELLANEOUS) IMPLANT
SET IRRIG TUBING LAPAROSCOPIC (IRRIGATION / IRRIGATOR) ×3 IMPLANT
SHEARS CURVED HARMONIC AC 45CM (MISCELLANEOUS) ×3 IMPLANT
SLEEVE ADV FIXATION 5X100MM (TROCAR) ×6 IMPLANT
SLEEVE GASTRECTOMY 36FR VISIGI (MISCELLANEOUS) ×3 IMPLANT
SLEEVE XCEL OPT CAN 5 100 (ENDOMECHANICALS) IMPLANT
SOLUTION ANTI FOG 6CC (MISCELLANEOUS) ×3 IMPLANT
SPONGE LAP 18X18 X RAY DECT (DISPOSABLE) ×3 IMPLANT
STAPLER ECHELON BIOABSB 60 FLE (MISCELLANEOUS) ×12 IMPLANT
STAPLER ECHELON LONG 60 440 (INSTRUMENTS) ×3 IMPLANT
STAPLER RELOAD BLUE 60MM (STAPLE) ×3
STAPLER RELOAD GOLD 60MM (STAPLE) ×3
STAPLER RELOAD GREEN 60MM (STAPLE) ×9
SUT MNCRL AB 4-0 PS2 18 (SUTURE) ×3 IMPLANT
SUT SURGIDAC NAB ES-9 0 48 120 (SUTURE) IMPLANT
SUT VICRYL 0 TIES 12 18 (SUTURE) ×3 IMPLANT
SYR 10ML ECCENTRIC (SYRINGE) ×3 IMPLANT
SYR 20CC LL (SYRINGE) ×3 IMPLANT
SYR 50ML LL SCALE MARK (SYRINGE) ×3 IMPLANT
TOWEL OR 17X26 10 PK STRL BLUE (TOWEL DISPOSABLE) ×3 IMPLANT
TOWEL OR NON WOVEN STRL DISP B (DISPOSABLE) ×3 IMPLANT
TRAY FOLEY W/METER SILVER 14FR (SET/KITS/TRAYS/PACK) IMPLANT
TRAY FOLEY W/METER SILVER 16FR (SET/KITS/TRAYS/PACK) IMPLANT
TROCAR ADV FIXATION 5X100MM (TROCAR) ×3 IMPLANT
TROCAR BLADELESS 15MM (ENDOMECHANICALS) ×3 IMPLANT
TROCAR BLADELESS OPT 5 100 (ENDOMECHANICALS) ×3 IMPLANT
TUBING CONNECTING 10 (TUBING) ×3 IMPLANT
TUBING ENDO SMARTCAP (MISCELLANEOUS) ×3 IMPLANT
TUBING FILTER THERMOFLATOR (ELECTROSURGICAL) ×3 IMPLANT

## 2014-11-07 NOTE — Anesthesia Postprocedure Evaluation (Signed)
  Anesthesia Post-op Note  Patient: Cynthia Vazquez  Procedure(s) Performed: Procedure(s): LAPAROSCOPIC GASTRIC SLEEVE RESECTION HIATAL HERNIA  REPAIR/ UPPER ENDO (N/A) UPPER GI ENDOSCOPY  Patient Location: PACU  Anesthesia Type:General  Level of Consciousness: awake, alert  and oriented  Airway and Oxygen Therapy: Patient Spontanous Breathing  Post-op Pain: mild  Post-op Assessment: Post-op Vital signs reviewed and Patient's Cardiovascular Status Stable              Post-op Vital Signs: Reviewed and stable  Last Vitals:  Filed Vitals:   11/07/14 1800  BP: 118/61  Pulse:   Temp:   Resp:     Complications: No apparent anesthesia complications

## 2014-11-07 NOTE — Anesthesia Procedure Notes (Signed)
Procedure Name: Intubation Date/Time: 11/07/2014 3:20 PM Performed by: Glory Buff Pre-anesthesia Checklist: Patient identified, Emergency Drugs available, Suction available and Patient being monitored Patient Re-evaluated:Patient Re-evaluated prior to inductionOxygen Delivery Method: Circle System Utilized Preoxygenation: Pre-oxygenation with 100% oxygen Intubation Type: IV induction Ventilation: Mask ventilation without difficulty Laryngoscope Size: Mac and 4 Grade View: Grade I Tube type: Oral Tube size: 7.5 mm Number of attempts: 1 Airway Equipment and Method: Stylet and Oral airway Placement Confirmation: ETT inserted through vocal cords under direct vision,  positive ETCO2 and breath sounds checked- equal and bilateral Secured at: 21 cm Tube secured with: Tape Dental Injury: Teeth and Oropharynx as per pre-operative assessment

## 2014-11-07 NOTE — Anesthesia Preprocedure Evaluation (Addendum)
Anesthesia Evaluation  Patient identified by MRN, date of birth, ID band Patient awake    Reviewed: Allergy & Precautions, NPO status , Patient's Chart, lab work & pertinent test results  Airway Mallampati: I  TM Distance: >3 FB Neck ROM: Full    Dental  (+) Upper Dentures, Dental Advisory Given   Pulmonary neg pulmonary ROS,    breath sounds clear to auscultation       Cardiovascular hypertension, Pt. on medications  Rhythm:Regular Rate:Normal     Neuro/Psych  Headaches, negative psych ROS   GI/Hepatic Neg liver ROS, hiatal hernia,   Endo/Other  diabetes, Type 2  Renal/GU negative Renal ROS  negative genitourinary   Musculoskeletal negative musculoskeletal ROS (+)   Abdominal   Peds negative pediatric ROS (+)  Hematology   Anesthesia Other Findings   Reproductive/Obstetrics negative OB ROS                           Lab Results  Component Value Date   WBC 6.1 10/28/2014   HGB 13.0 10/28/2014   HCT 41.7 10/28/2014   MCV 78.2 10/28/2014   PLT 307 10/28/2014   Lab Results  Component Value Date   CREATININE 0.63 10/28/2014   BUN 13 10/28/2014   NA 135 10/28/2014   K 4.0 10/28/2014   CL 103 10/28/2014   CO2 24 10/28/2014   Lab Results  Component Value Date   INR 1.02 07/01/2014   EKG: normal sinus rhythm.   Anesthesia Physical Anesthesia Plan  ASA: III  Anesthesia Plan: General   Post-op Pain Management:    Induction: Intravenous  Airway Management Planned: Oral ETT  Additional Equipment:   Intra-op Plan:   Post-operative Plan: Extubation in OR  Informed Consent: I have reviewed the patients History and Physical, chart, labs and discussed the procedure including the risks, benefits and alternatives for the proposed anesthesia with the patient or authorized representative who has indicated his/her understanding and acceptance.   Dental advisory given  Plan  Discussed with: CRNA  Anesthesia Plan Comments:         Anesthesia Quick Evaluation

## 2014-11-07 NOTE — H&P (View-Only) (Signed)
Cynthia Vazquez 10/27/2014 11:20 AM Location: [facility] Patient #: [id number] DOB: May 30, 1970 Married / Language: English / Race: Black or African American Female History of Present Illness Randall Hiss M. Priscilla Kirstein MD; 10/27/2014 2:20 PM) Patient words: bariatric.  The patient is a 44 year old female who presents for a preoperative evaluation. She comes in today for her preoperative appointment. She has been approved for laparoscopic sleeve gastrectomy. I initially met her on April 6. Her weight in that time was 365 pounds. She states she did have an episode of right-sided abdominal pain which prompted her to go to the emergency room in early May. She had a negative CT scan. She denies any recurrent episodes of abdominal pain. She denies any heartburn or reflux. She denies any nausea or vomiting recently. She denies any diarrhea. She denies any lightheadedness or dizziness. Her preoperative workup revealed evidence of a small hiatal hernia on her upper GI. Her chest x-ray was within normal limits. Some of her evaluation labs were abnormal. Her hemoglobin A1c level was 7.4. She states that she had a repeat at her primary care physician's office and it was at 6.1. She also was found to have some elevated lipids with a total cholesterol level of 234, LDL level 155, triglyceride level 68, HDL level 65 Problem List/Past Medical Gayland Curry, MD; 10/27/2014 2:21 PM) ESSENTIAL HYPERTENSION WITH GOAL BLOOD PRESSURE LESS THAN 130/80 (401.9  I10) LUMBAR AND SACRAL OSTEOARTHRITIS (721.3  M47.817) OBESITY, MORBID, BMI 50 OR HIGHER (278.01  E66.01) HISTORY OF IRON DEFICIENCY ANEMIA (V12.3  Z86.2) PREDIABETES (790.29  R73.09)  Other Problems Gayland Curry, MD; 10/27/2014 2:21 PM) Unspecified Diagnosis  Past Surgical History Gayland Curry, MD; 10/27/2014 2:21 PM) Oral Surgery  Diagnostic Studies History Gayland Curry, MD; 10/27/2014 2:21 PM) Colonoscopy never Mammogram never Pap  Smear 1-5 years ago  Allergies Gayland Curry, MD; 10/27/2014 2:21 PM) Shellfish-derived Products Augmented Betamethasone Diprop *DERMATOLOGICALS* Augmentin *PENICILLINS*  Medication History Gayland Curry, MD; 10/27/2014 2:21 PM) Lidex (0.05% Cream, External) Active. Flonase (50MCG/ACT Suspension, Nasal) Active. Advil (200MG Tablet, Oral) Active. Lisinopril-Hydrochlorothiazide (20-12.5MG Tablet, Oral) Active. Claritin (10MG Capsule, Oral) Active. Lysteda (650MG Tablet, Oral) Active. Drisdol (50000UNIT Capsule, Oral) Active. Medications Reconciled OxyCODONE HCl (5MG/5ML Solution, 5-10 Milliliter Oral every four hours, as needed, Taken starting 10/27/2014) Active. Ondansetron (4MG Tablet Disperse, 1 (one) Tablet Disperse Oral every six hours, as needed, Taken starting 10/27/2014) Active. NexIUM (40MG Capsule DR, 1 (one) Capsule DR Oral daily, Taken starting 10/27/2014) Active.  Social History Gayland Curry, MD; 10/27/2014 2:21 PM) No drug use Tobacco use Never smoker. Caffeine use Carbonated beverages, Tea. No alcohol use  Family History Gayland Curry, MD; 10/27/2014 2:21 PM) Diabetes Mellitus Father, Mother. Heart Disease Father. Heart disease in female family member before age 78 Hypertension Father, Mother, Son.  Pregnancy / Birth History Gayland Curry, MD; 10/27/2014 2:21 PM) Regular periods Gravida 2 Maternal age 38-25 Para 2 Age at menarche 74 years.     Review of Systems Gayland Curry, MD; 10/27/2014 2:17 00) General Present- Weight Gain and Weight Loss. Not Present- Appetite Loss, Chills, Fatigue, Fever and Night Sweats. Skin Not Present- Change in Wart/Mole, Dryness, Hives, Jaundice, New Lesions, Non-Healing Wounds, Rash and Ulcer. HEENT Present- Seasonal Allergies. Not Present- Earache, Hearing Loss, Hoarseness, Nose Bleed, Oral Ulcers, Ringing in the Ears, Sinus Pain, Sore Throat, Visual Disturbances, Wears glasses/contact lenses and Yellow  Eyes. Breast Not Present- Breast Mass, Breast Pain, Nipple Discharge and Skin  Changes. Cardiovascular Not Present- Chest Pain, Difficulty Breathing Lying Down, Leg Cramps, Palpitations, Rapid Heart Rate, Shortness of Breath and Swelling of Extremities. Gastrointestinal Not Present- Abdominal Pain, Bloating, Bloody Stool, Change in Bowel Habits, Chronic diarrhea, Constipation, Difficulty Swallowing, Excessive gas, Gets full quickly at meals, Hemorrhoids, Indigestion, Nausea, Rectal Pain and Vomiting. Female Genitourinary Not Present- Frequency, Nocturia, Painful Urination, Pelvic Pain and Urgency. Musculoskeletal Present- Back Pain and Joint Pain. Not Present- Joint Stiffness, Muscle Pain, Muscle Weakness and Swelling of Extremities. Neurological Not Present- Decreased Memory, Fainting, Headaches, Numbness, Seizures, Tingling, Tremor, Trouble walking and Weakness. Psychiatric Not Present- Anxiety, Bipolar, Change in Sleep Pattern, Depression, Fearful and Frequent crying. Endocrine Not Present- Cold Intolerance, Excessive Hunger, Hair Changes, Heat Intolerance, Hot flashes and New Diabetes. Hematology Not Present- Easy Bruising, Excessive bleeding, Gland problems, HIV and Persistent Infections.  Vitals (Sonya Bynum CMA; 10/27/2014 11:21 AM) 10/27/2014 11:20 AM Weight: 346.4 lb Height: 68.75in Body Surface Area: 2.76 m Body Mass Index: 51.53 kg/m Temp.: 35F(Temporal)  Pulse: 82 (Regular)  BP: 130/78 (Sitting, Left Arm, Standard)     Physical Exam Randall Hiss M. Loden Laurent MD; 10/27/2014 2:17 PM)  General Mental Status-Alert. General Appearance-Consistent with stated age. Hydration-Well hydrated. Voice-Normal. Note: morbid obesity   Head and Neck Head-normocephalic, atraumatic with no lesions or palpable masses. Trachea-midline. Thyroid Gland Characteristics - normal size and consistency.  Eye Eyeball - Bilateral-Extraocular movements intact. Sclera/Conjunctiva -  Bilateral-No scleral icterus.  Chest and Lung Exam Chest and lung exam reveals -quiet, even and easy respiratory effort with no use of accessory muscles and on auscultation, normal breath sounds, no adventitious sounds and normal vocal resonance. Inspection Chest Wall - Normal. Back - normal.  Breast - Did not examine.  Cardiovascular Cardiovascular examination reveals -normal heart sounds, regular rate and rhythm with no murmurs and normal pedal pulses bilaterally.  Abdomen Inspection Inspection of the abdomen reveals - No Hernias. Skin - Scar - Note: well healed trocars. Palpation/Percussion Palpation and Percussion of the abdomen reveal - Soft, Non Tender, No Rebound tenderness, No Rigidity (guarding) and No hepatosplenomegaly. Auscultation Auscultation of the abdomen reveals - Bowel sounds normal.  Peripheral Vascular Upper Extremity Palpation - Pulses bilaterally normal.  Neurologic Neurologic evaluation reveals -alert and oriented x 3 with no impairment of recent or remote memory. Mental Status-Normal.  Neuropsychiatric The patient's mood and affect are described as -normal. Judgment and Insight-insight is appropriate concerning matters relevant to self.  Musculoskeletal Normal Exam - Left-Upper Extremity Strength Normal and Lower Extremity Strength Normal. Normal Exam - Right-Upper Extremity Strength Normal and Lower Extremity Strength Normal.  Lymphatic Head & Neck  General Head & Neck Lymphatics: Bilateral - Description - Normal. Axillary - Did not examine. Femoral & Inguinal - Did not examine.    Assessment & Plan Randall Hiss M. Cele Mote MD; 10/27/2014 2:21 PM)  OBESITY, MORBID, BMI 50 OR HIGHER (278.01  E66.01) Impression: We reviewed her preoperative workup. We discussed the finding of a small hiatal hernia. I recommended testing her for a clinically significant hiatal hernia during surgery. If found to have a relevant hiatal hernia during surgery  I recommended proceeding with repair. We discussed what surgical repair would involve. We discussed the importance of the preoperative diet. She is only started this. She has lost approximately 19 pounds since her initial visit. I answered all her additional questions she had about surgery as well as the typical recovery period. She was given prescriptions for postoperative pain, nausea, and heartburn.  Current Plans Pt Education - EMW_preopbariatric Started  OxyCODONE HCl 5MG/5ML, 5-10 Milliliter every four hours, as needed, 200 Milliliter, 10/27/2014, No Refill. Started Ondansetron 4MG, 1 (one) Tablet Disperse every six hours, as needed, #30, 10/27/2014, No Refill. Started NexIUM 40MG, 1 (one) Capsule DR daily, #30, 10/27/2014, No Refill. LUMBAR AND SACRAL OSTEOARTHRITIS (721.3  M47.817) Impression: per pcp  ESSENTIAL HYPERTENSION WITH GOAL BLOOD PRESSURE LESS THAN 130/80 (401.9  I10) Impression: per pcp  HISTORY OF IRON DEFICIENCY ANEMIA (V12.3  Z86.2) Impression: per pcp  PREDIABETES (790.29  R73.09) Impression: Per PCP  [name]. Redmond Pulling, MD, FACS General, Bariatric, & Minimally Invasive Surgery [facility], [place]

## 2014-11-07 NOTE — Op Note (Signed)
Procedure: Upper GI endoscopy  Description of procedure: Upper GI endoscopy is performed at the completion of laparoscopic sleeve gastrectomy by Dr.  Redmond Pulling.  The video endoscope was introduced into the upper esophagus and then passed to the EG junction at about 40 cm. The esophagus appeared normal. The gastric sleeve was entered. The sleeve was tensely distended with air while the outlet was obstructed under saline irrigation by the operating surgeon. There was no evidence of leak. The staple line was intact and without bleeding. The scope was advanced to the antrum and pylorus visualized. There was no stricture or twisting or mucosal abnormality, and particularly no narrowing noted at the incisura.  The pouch was then desufflated and the scope withdrawn.  Edward Jolly MD, FACS  11/07/2014, 4:57 PM

## 2014-11-07 NOTE — Transfer of Care (Signed)
Immediate Anesthesia Transfer of Care Note  Patient: Cynthia Vazquez  Procedure(s) Performed: Procedure(s): LAPAROSCOPIC GASTRIC SLEEVE RESECTION HIATAL HERNIA  REPAIR/ UPPER ENDO (N/A) UPPER GI ENDOSCOPY  Patient Location: PACU  Anesthesia Type:General  Level of Consciousness: awake, alert  and oriented  Airway & Oxygen Therapy: Patient Spontanous Breathing and Patient connected to face mask oxygen  Post-op Assessment: Report given to RN and Post -op Vital signs reviewed and stable  Post vital signs: Reviewed and stable  Last Vitals:  Filed Vitals:   11/07/14 1223  BP: 149/81  Pulse: 72  Temp: 36.8 C  Resp: 18    Complications: No apparent anesthesia complications

## 2014-11-07 NOTE — Interval H&P Note (Signed)
History and Physical Interval Note:  11/07/2014 2:43 PM  Cynthia Vazquez  has presented today for surgery, with the diagnosis of Morbid Obesity  The various methods of treatment have been discussed with the patient and family. After consideration of risks, benefits and other options for treatment, the patient has consented to  Procedure(s): LAPAROSCOPIC GASTRIC SLEEVE RESECTION/POSSIBLE HIATAL HERNIA REPAIR/UPPER ENDO (N/A) as a surgical intervention .  The patient's history has been reviewed, patient examined, no change in status, stable for surgery.  I have reviewed the patient's chart and labs.  Questions were answered to the patient's satisfaction.    [name]. Redmond Pulling, MD, [place], Bariatric, & Minimally Invasive Surgery [facility], [place]   [facility]

## 2014-11-07 NOTE — Op Note (Signed)
11/07/2014 Anne Ng Dec 24, 1970 [id number]   PRE-OPERATIVE DIAGNOSIS:     Morbid obesity with BMI of 55   HTN (hypertension)   Prediabetes   Hiatal hernia   Lumbar and sacral osteoarthritis  POST-OPERATIVE DIAGNOSIS:  same  PROCEDURE:  Procedure(s): LAPAROSCOPIC SLEEVE GASTRECTOMY WITH HIATAL HERNIA REPAIR UPPER GI ENDOSCOPY  SURGEON:  Surgeon(s): Gayland Curry, MD FACS FASMBS  ASSISTANTS: Excell Seltzer MD FACS  ANESTHESIA:   general  DRAINS: none   BOUGIE: 76 fr ViSiGi  LOCAL MEDICATIONS USED:  MARCAINE + Exparel  SPECIMEN:  Source of Specimen:  Greater curvature of stomach  DISPOSITION OF SPECIMEN:  PATHOLOGY  COUNTS:  YES  INDICATION FOR PROCEDURE: This is a very pleasant 44 year old morbidly obese female who has had unsuccessful attempts for sustained weight loss. She presents today for a planned laparoscopic sleeve gastrectomy with possible hiatal hernia repair and upper endoscopy. We have discussed the risk and benefits of the procedure extensively preoperatively. Please see my separate notes.  PROCEDURE: After obtaining informed consent and receiving 5000 units of subcutaneous heparin, the patient was brought to the operating room at [facility] and placed supine on the operating room table. General endotracheal anesthesia was established. Sequential compression devices were placed. A orogastric tube was placed. The patient's abdomen was prepped and draped in the usual standard surgical fashion. She received preoperative IV antibiotics. A surgical timeout was performed.  Access to the abdomen was achieved using a 5 mm 0 laparoscope thru a 5 mm trocar In the left upper Quadrant 2 fingerbreadths below the left subcostal margin using the Optiview technique. Pneumoperitoneum was smoothly established up to 15 mm of mercury. The laparoscope was advanced and the abdominal cavity was surveilled. There were some omental adhesions in mid abdomen which were left  alone. One adhesive band was taken down in the upper midline after trocars were placed. The patient was then placed in reverse Trendelenburg. There was evidence of a hiatal hernia on laparoscopy - gap in the left and right crus anteriorly.  A 5 mm trocar was placed slightly above and to the left of the umbilicus under direct visualization.  The [facility] liver retractor was placed under the left lobe of the liver through a 5 mm trocar incision site in the subxiphoid position. A 5 mm trocar was placed in the lateral right upper quadrant along with a 15 mm trocar in the mid right abdomen. A final 5 mm trocar was placed in the lateral LUQ.  All under direct visualization after local had been infiltrated.  The stomach was inspected. It was completely decompressed and the orogastric tube was removed.  There is a small anterior dimple that was obviously visible. The calibration tube was placed in the oropharynx and guided down into the stomach by the CRNA. 10 mL of air was insufflated into the calibration balloon. The calibration tubing was then gently pulled back by the CRNA and it slid past the GE junction. At this point the calibration tubing was desufflated and pulled back into the esophagus. This confirmed my suspicion of a clinically significant hiatal hernia. The gastrohepatic ligament was incised with harmonic scalpel. The right crus was identified. We identified the crossing fat along the right crus. The adipose tissue just above this area was incised with harmonic scalpel. I then bluntly dissected out this area and identified the left crus. There was evidence of a hiatal hernia. I then mobilized the esophagus. The left and right crus were further mobilized with blunt dissection.  I was then able to reapproximate the left and right crus with 0 Ethibond using an Endostitch suture device and securing it with a titanium tyknot. We then had the CRNA readvanced the calibration tubing back into the stomach. 10 mL of  air was insufflated into the calibration tube balloon. The calibration tube was then gently pulled back and there was resistance at the GE junction. The tube did not slide back up into the esophagus. At this point the calibration tubing was deflated and removed from the patient's body.  We identified the pylorus and measured 6 cm proximal to the pylorus and identified an area of where we would start taking down the short gastric vessels. Harmonic scalpel was used to take down the short gastric vessels along the greater curvature of the stomach. We were able to enter the lesser sac. We continued to march along the greater curvature of the stomach taking down the short gastrics. As we approached the gastrosplenic ligament we took care in this area not to injure the spleen. We were able to take down the entire gastrosplenic ligament. We then mobilized the fundus away from the left crus of diaphragm. There were a few posterior gastric avascular attachments which were taken down. This left the stomach completely mobilized. No vessels had been taken down along the lesser curvature of the stomach.  We then reidentified the pylorus. A 36Fr ViSiGi was then placed in the oropharynx and advanced down into the stomach and placed in the distal antrum and positioned along the lesser curvature. It was placed under suction which secured the 36Fr ViSiGi in place along the lesser curve. Then using the Ethicon echelon 60 mm stapler with a green load with Seamguard, I placed a stapler along the antrum approximately 5 cm from the pylorus. The stapler was angled so that there is ample room at the angularis incisura. I then fired the first staple load after inspecting it posteriorly to ensure adequate space both anteriorly and posteriorly. At this point I still was not completely past the angularis so with another green load with Seamguard, I placed the stapler in position just inside the prior stapleline. We then rotated the stomach  to insure that there was adequate anteriorly as well as posteriorly. The stapler was then fired. I used another 7mm green cartridge with seamguard. At this point I started using 60 mm gold load staple cartridges with Seamguard. The echelon stapler was then repositioned with a 60 mm gold load with Seamguard and we continued to march up along the [place]. My assistant was holding traction along the greater curvature stomach along the cauterized short gastric vessels ensuring that the stomach was symmetrically retracted. Prior to each firing of the staple, we rotated the stomach to ensure that there is adequate stomach left.  As we approached the fundus, I used 60 mm blue cartridge with Seamguard aiming slightly lateral to the esophageal fat pad. Although the staples on this fire had completely gone thru the last part of the stomach it had not completely cut it. Therefore 1 additional 60 blue load was used to free the remaining stomach. The sleeve was inspected. There is no evidence of cork screw. The staple line appeared hemostatic. The CRNA inflated the ViSiGi to the green zone and the upper abdomen was flooded with saline. There were no bubbles. The sleeve was decompressed and the ViSiGi removed. My assistant scrubbed out and performed an upper endoscopy. The sleeve easily distended with air and the scope was easily  advanced to the pylorus. There is no evidence of internal bleeding or cork screwing. There was no narrowing at the angularis. There is no evidence of bubbles. Please see his operative note for further details. The gastric sleeve was decompressed and the endoscope was removed.  The greater curvature the stomach was grasped with a laparoscopic grasper and removed from the 15 mm trocar site.  The liver retractor was removed. I then closed the 15 mm trocar site with 2 interrupted 0 Vicryl sutures through the fascia using the endoclose. The closure was viewed laparoscopically and it was airtight. 70 cc of  Exparel was then infiltrated in the preperitoneal spaces around the trocar sites. Pneumoperitoneum was released. All trocar sites were closed with a 4-0 Monocryl in a subcuticular fashion followed by the application of Dermabond. The patient was extubated and taken to the recovery room in stable condition. All needle, instrument, and sponge counts were correct x2. There are no immediate complications  (3) 60 mm green with Seamguard (1) 60 mm gold with seamguard (3) 60 mm blue with 2 seamguard  PLAN OF CARE: Admit to inpatient   PATIENT DISPOSITION:  PACU - hemodynamically stable.   Delay start of Pharmacological VTE agent (>24hrs) due to surgical blood loss or risk of bleeding:  no  [name]. Redmond Pulling, MD, FACS FASMBS General, Bariatric, & Minimally Invasive Surgery [facility], [place]

## 2014-11-08 ENCOUNTER — Encounter (HOSPITAL_COMMUNITY): Payer: Self-pay | Admitting: General Surgery

## 2014-11-08 LAB — CBC WITH DIFFERENTIAL/PLATELET
Basophils Absolute: 0 10*3/uL (ref 0.0–0.1)
Basophils Relative: 0 % (ref 0–1)
Eosinophils Absolute: 0 10*3/uL (ref 0.0–0.7)
Eosinophils Relative: 0 % (ref 0–5)
HCT: 40.7 % (ref 36.0–46.0)
HEMOGLOBIN: 12.6 g/dL (ref 12.0–15.0)
LYMPHS ABS: 1.4 10*3/uL (ref 0.7–4.0)
LYMPHS PCT: 17 % (ref 12–46)
MCH: 24.4 pg — AB (ref 26.0–34.0)
MCHC: 31 g/dL (ref 30.0–36.0)
MCV: 78.7 fL (ref 78.0–100.0)
MONOS PCT: 6 % (ref 3–12)
Monocytes Absolute: 0.5 10*3/uL (ref 0.1–1.0)
NEUTROS PCT: 77 % (ref 43–77)
Neutro Abs: 6.3 10*3/uL (ref 1.7–7.7)
Platelets: 271 10*3/uL (ref 150–400)
RBC: 5.17 MIL/uL — AB (ref 3.87–5.11)
RDW: 15.2 % (ref 11.5–15.5)
WBC: 8.2 10*3/uL (ref 4.0–10.5)

## 2014-11-08 LAB — HEMOGLOBIN AND HEMATOCRIT, BLOOD
HEMATOCRIT: 40.9 % (ref 36.0–46.0)
HEMOGLOBIN: 13 g/dL (ref 12.0–15.0)

## 2014-11-08 LAB — GLUCOSE, CAPILLARY
GLUCOSE-CAPILLARY: 75 mg/dL (ref 65–99)
GLUCOSE-CAPILLARY: 71 mg/dL (ref 65–99)
GLUCOSE-CAPILLARY: 67 mg/dL (ref 65–99)
Glucose-Capillary: 100 mg/dL — ABNORMAL HIGH (ref 65–99)
Glucose-Capillary: 103 mg/dL — ABNORMAL HIGH (ref 65–99)
Glucose-Capillary: 104 mg/dL — ABNORMAL HIGH (ref 65–99)
Glucose-Capillary: 110 mg/dL — ABNORMAL HIGH (ref 65–99)

## 2014-11-08 LAB — COMPREHENSIVE METABOLIC PANEL
ALK PHOS: 74 U/L (ref 38–126)
ALT: 57 U/L — AB (ref 14–54)
AST: 74 U/L — ABNORMAL HIGH (ref 15–41)
Albumin: 3.5 g/dL (ref 3.5–5.0)
Anion gap: 9 (ref 5–15)
BILIRUBIN TOTAL: 0.4 mg/dL (ref 0.3–1.2)
BUN: 9 mg/dL (ref 6–20)
CALCIUM: 8.9 mg/dL (ref 8.9–10.3)
CO2: 22 mmol/L (ref 22–32)
CREATININE: 0.73 mg/dL (ref 0.44–1.00)
Chloride: 104 mmol/L (ref 101–111)
Glucose, Bld: 109 mg/dL — ABNORMAL HIGH (ref 65–99)
Potassium: 4.3 mmol/L (ref 3.5–5.1)
Sodium: 135 mmol/L (ref 135–145)
TOTAL PROTEIN: 7.3 g/dL (ref 6.5–8.1)

## 2014-11-08 MED ORDER — FENTANYL CITRATE (PF) 100 MCG/2ML IJ SOLN
25.0000 ug | INTRAMUSCULAR | Status: DC | PRN
Start: 2014-11-08 — End: 2014-11-09
  Administered 2014-11-08: 25 ug via INTRAVENOUS
  Filled 2014-11-08: qty 2

## 2014-11-08 MED ORDER — DEXTROSE 50 % IV SOLN
INTRAVENOUS | Status: AC
  Administered 2014-11-08: 25 mL
  Filled 2014-11-08: qty 50

## 2014-11-08 MED ORDER — ONDANSETRON 4 MG PO TBDP: 4.0000 mg | ORAL_TABLET | Freq: Four times a day (QID) | ORAL | Status: DC | PRN

## 2014-11-08 MED ORDER — METOCLOPRAMIDE HCL 5 MG/ML IJ SOLN: 10.0000 mg | Freq: Three times a day (TID) | INTRAMUSCULAR | Status: DC

## 2014-11-08 MED ORDER — METOCLOPRAMIDE HCL 5 MG/ML IJ SOLN
10.0000 mg | Freq: Three times a day (TID) | INTRAMUSCULAR | Status: DC | PRN
  Administered 2014-11-08 (×2): 10 mg via INTRAVENOUS
  Filled 2014-11-08 (×2): qty 2

## 2014-11-08 MED ORDER — PROMETHAZINE HCL 25 MG RE SUPP: 25.0000 mg | Freq: Four times a day (QID) | RECTAL | Status: DC | PRN

## 2014-11-08 MED ORDER — ALUM & MAG HYDROXIDE-SIMETH 200-200-20 MG/5ML PO SUSP: 15.0000 mL | Freq: Four times a day (QID) | ORAL | Status: DC | PRN

## 2014-11-08 NOTE — Progress Notes (Addendum)
Patient alert and oriented, Post op day 1.  Provided support and encouragement.  Encouraged pulmonary toilet, ambulation and small sips of liquids.  Patient nauseated and teary, provided comfort and explained nausea is not unexpected after gastric surgery.  All questions answered.  Will continue to monitor.  Spoke with Dr. Redmond Pulling, re: no IV access, IV team evaluated, recommend PICC line, received orders to have PICC placed.  Will continue to monitor.

## 2014-11-08 NOTE — Progress Notes (Signed)
1 Day Post-Op  Pt rounded on twice Subjective:  Had a lot of nausea overnight and this am. Lost IV access but anesthesia able to place one. Switched IV pain medication to fentanyl as Morphine may have contributed to nausea. Nausea improved significantly by around 2:30. Ambulated multiple times. Pain ok.  Objective: Vital signs in last 24 hours: Temp:  [97.7 F (36.5 C)-98.6 F (37 C)] 98.2 F (36.8 C) (09/13 1758) Pulse Rate:  [64-78] 76 (09/13 1758) Resp:  [15-20] 18 (09/13 1758) BP: (120-142)/(43-84) 130/72 mmHg (09/13 1758) SpO2:  [95 %-99 %] 99 % (09/13 1758)    Intake/Output from previous day: 09/12 0701 - 09/13 0700 In: 1800 [I.V.:1800] Out: 450 [Urine:425; Blood:25] Intake/Output this shift:   Alert, nontoxic Symmetric chest rise, nonlabored Soft, obese, incisions c/d/i; mild TTP +scds  Lab Results:   Recent Labs  11/08/14 0555 11/08/14 1640  WBC 8.2  --   HGB 12.6 13.0  HCT 40.7 40.9  PLT 271  --    BMET  Recent Labs  11/08/14 0555  NA 135  K 4.3  CL 104  CO2 22  GLUCOSE 109*  BUN 9  CREATININE 0.73  CALCIUM 8.9   Hepatic Function Latest Ref Rng 11/08/2014 10/28/2014 07/01/2014  Total Protein 6.5 - 8.1 g/dL 7.3 8.3(H) 8.5(H)  Albumin 3.5 - 5.0 g/dL 3.5 4.0 4.6  AST 15 - 41 U/L 74(H) 25 24  ALT 14 - 54 U/L 57(H) 21 20  Alk Phosphatase 38 - 126 U/L 74 93 88  Total Bilirubin 0.3 - 1.2 mg/dL 0.4 0.7 0.8     PT/INR No results for input(s): LABPROT, INR in the last 72 hours. ABG No results for input(s): PHART, HCO3 in the last 72 hours.  Invalid input(s): PCO2, PO2  Studies/Results: No results found.  Anti-infectives: Anti-infectives    Start     Dose/Rate Route Frequency Ordered Stop   11/07/14 0600  levofloxacin (LEVAQUIN) IVPB 750 mg     750 mg 100 mL/hr over 90 Minutes Intravenous On call to O.R. 11/06/14 1445 11/07/14 1622      Assessment/Plan: s/p Procedure(s): LAPAROSCOPIC GASTRIC SLEEVE RESECTION HIATAL HERNIA  REPAIR/ UPPER  ENDO (N/A) UPPER GI ENDOSCOPY  No fever. No tachycardia. Doesn't look toxic Start POD 1 diet Antiemetics  Ambulate pulm toilet  [name]. Redmond Pulling, MD, FACS General, Bariatric, & Minimally Invasive Surgery [facility], [place]   LOS: 1 day    Gayland Curry 11/08/2014

## 2014-11-08 NOTE — Progress Notes (Signed)
Patient lost IV access.  IV team consulted unable to get line.  Anesthesia notified and asked to assess for iv site.  Patient made aware.  Dr Redmond Pulling made aware per Vilinda Flake RN

## 2014-11-08 NOTE — Plan of Care (Signed)
Problem: Food- and Nutrition-Related Knowledge Deficit (NB-1.1) Goal: Nutrition education Formal process to instruct or train a patient/client in a skill or to impart knowledge to help patients/clients voluntarily manage or modify food choices and eating behavior to maintain or improve health. Outcome: Completed/Met Date Met:  11/08/14 Nutrition Education Note  Received consult for diet education per DROP protocol.   Discussed 2 week post op diet with pt. Emphasized that liquids must be non carbonated, non caffeinated, and sugar free. Fluid goals discussed. Pt to follow up with outpatient bariatric RD for further diet progression after 2 weeks. Multivitamins and minerals also reviewed. Teach back method used, pt expressed understanding, expect good compliance.   Diet: First 2 Weeks  You will see the nutritionist about two (2) weeks after your surgery. The nutritionist will increase the types of foods you can eat if you are handling liquids well:  If you have severe vomiting or nausea and cannot handle clear liquids lasting longer than 1 day, call your surgeon  Protein Shake  Drink at least 2 ounces of shake 5-6 times per day  Each serving of protein shakes (usually 8 - 12 ounces) should have a minimum of:  15 grams of protein  And no more than 5 grams of carbohydrate  Goal for protein each day:  Men = 80 grams per day  Women = 60 grams per day  Protein powder may be added to fluids such as non-fat milk or Lactaid milk or Soy milk (limit to 35 grams added protein powder per serving)   Hydration  Slowly increase the amount of water and other clear liquids as tolerated (See Acceptable Fluids)  Slowly increase the amount of protein shake as tolerated  Sip fluids slowly and throughout the day  May use sugar substitutes in small amounts (no more than 6 - 8 packets per day; i.e. Splenda)   Fluid Goal  The first goal is to drink at least 8 ounces of protein shake/drink per day (or as directed  by the nutritionist); some examples of protein shakes are Syntrax Nectar, Adkins Advantage, EAS Edge HP, and Unjury. See handout from pre-op Bariatric Education Class:  Slowly increase the amount of protein shake you drink as tolerated  You may find it easier to slowly sip shakes throughout the day  It is important to get your proteins in first  Your fluid goal is to drink 64 - 100 ounces of fluid daily  It may take a few weeks to build up to this  32 oz (or more) should be clear liquids  And  32 oz (or more) should be full liquids (see below for examples)  Liquids should not contain sugar, caffeine, or carbonation   Clear Liquids:  Water or Sugar-free flavored water (i.e. Fruit H2O, Propel)  Decaffeinated coffee or tea (sugar-free)  Crystal Lite, Wyler's Lite, Minute Maid Lite  Sugar-free Jell-O  Bouillon or broth  Sugar-free Popsicle: *Less than 20 calories each; Limit 1 per day   Full Liquids:  Protein Shakes/Drinks + 2 choices per day of other full liquids  Full liquids must be:  No More Than 12 grams of Carbs per serving  No More Than 3 grams of Fat per serving  Strained low-fat cream soup  Non-Fat milk  Fat-free Lactaid Milk  Sugar-free yogurt (Dannon Lite & Fit, Greek yogurt)     Alanya Vukelich, MS, RD, LDN Pager: [phone] After Hours Pager: [phone]        

## 2014-11-09 LAB — GLUCOSE, CAPILLARY
GLUCOSE-CAPILLARY: 79 mg/dL (ref 65–99)
GLUCOSE-CAPILLARY: 83 mg/dL (ref 65–99)
Glucose-Capillary: 87 mg/dL (ref 65–99)
Glucose-Capillary: 87 mg/dL (ref 65–99)

## 2014-11-09 LAB — CBC WITH DIFFERENTIAL/PLATELET
BASOS ABS: 0.1 10*3/uL (ref 0.0–0.1)
BASOS PCT: 1 %
EOS ABS: 0.2 10*3/uL (ref 0.0–0.7)
EOS PCT: 3 %
HCT: 37 % (ref 36.0–46.0)
Hemoglobin: 12.1 g/dL (ref 12.0–15.0)
Lymphocytes Relative: 30 %
Lymphs Abs: 2 10*3/uL (ref 0.7–4.0)
MCH: 25 pg — ABNORMAL LOW (ref 26.0–34.0)
MCHC: 32.7 g/dL (ref 30.0–36.0)
MCV: 76.4 fL — AB (ref 78.0–100.0)
MONO ABS: 0.9 10*3/uL (ref 0.1–1.0)
Monocytes Relative: 13 %
Neutro Abs: 3.6 10*3/uL (ref 1.7–7.7)
Neutrophils Relative %: 53 %
PLATELETS: 240 10*3/uL (ref 150–400)
RBC: 4.84 MIL/uL (ref 3.87–5.11)
RDW: 15.1 % (ref 11.5–15.5)
WBC: 6.8 10*3/uL (ref 4.0–10.5)

## 2014-11-09 MED ORDER — OXYCODONE HCL 5 MG/5ML PO SOLN: 5.0000 mg | ORAL | Status: DC | PRN

## 2014-11-09 NOTE — Discharge Instructions (Signed)
Monitor blood pressure daily. If the top number (systolic) is consistently greater than 160 mmHg &/or the bottom number (diastolic) is consistently greater than 100 mmHg, please call your primary care doctor and restart your blood pressure medication   GASTRIC BYPASS/SLEEVE  Home Care Instructions   These instructions are to help you care for yourself when you go home.  Call: If you have any problems.  Call [phone] and ask for the surgeon on call  If you need immediate assistance come to the ER at [facility]. Tell the ER staff you are a new post-op gastric bypass or gastric sleeve patient  Signs and symptoms to report:  Severe  vomiting or nausea o If you cannot handle clear liquids for longer than 1 day, call your surgeon  Abdominal pain which does not get better after taking your pain medication  Fever greater than 100.4  F and chills  Heart rate over 100 beats a minute  Trouble breathing  Chest pain  Redness,  swelling, drainage, or foul odor at incision (surgical) sites  If your incisions open or pull apart  Swelling or pain in calf (lower leg)  Diarrhea (Loose bowel movements that happen often), frequent watery, uncontrolled bowel movements  Constipation, (no bowel movements for 3 days) if this happens: o Take Milk of Magnesia, 2 tablespoons by mouth, 3 times a day for 2 days if needed o Stop taking Milk of Magnesia once you have had a bowel movement o Call your doctor if constipation continues Or o Take Miralax  (instead of Milk of Magnesia) following the label instructions o Stop taking Miralax once you have had a bowel movement o Call your doctor if constipation continues  Anything you think is abnormal for you   Normal side effects after surgery:  Unable to sleep at night or unable to concentrate  Irritability  Being tearful (crying) or depressed  These are common complaints, possibly related to your anesthesia, stress of surgery, and change in  lifestyle, that usually go away a few weeks after surgery. If these feelings continue, call your medical doctor.  Wound Care: You may have surgical glue, steri-strips, or staples over your incisions after surgery  Surgical glue: Looks like clear film over your incisions and will wear off a little at a time  Steri-strips: Adhesive strips of tape over your incisions. You may notice a yellowish color on skin under the steri-strips. This is used to make the steri-strips stick better. Do not pull the steri-strips off - let them fall off  Staples: Staples may be removed before you leave the hospital o If you go home with staples, call [facility] for an appointment with your surgeons nurse to have staples removed 10 days after surgery, [phone]  Showering: You may shower two (2) days after your surgery unless your surgeon tells you differently o Wash gently around incisions with warm soapy water, rinse well, and gently pat dry o If you have a drain (tube from your incision), you may need someone to hold this while you shower o No tub baths until staples are removed and incisions are healed   Medications:  Medications should be liquid or crushed if larger than the size of a dime  Extended release pills (medication that releases a little bit at a time through the  day) should not be crushed  Depending on the size and number of medications you take, you may need to space (take a few throughout the day)/change the time  you take your medications so that you do not over-fill your pouch (smaller stomach)  Make sure you follow-up with you primary care physician to make medication changes needed during rapid weight loss and life -style changes  If you have diabetes, follow up with your doctor that orders your diabetes medication(s) within one week after surgery and check your blood sugar regularly   Do not drive while taking narcotics (pain medications)   Do not take acetaminophen  (Tylenol) and Roxicet or Lortab Elixir at the same time since these pain medications contain acetaminophen   Diet:  First 2 Weeks You will see the nutritionist about two (2) weeks after your surgery. The nutritionist will increase the types of foods you can eat if you are handling liquids well:  If you have severe vomiting or nausea and cannot handle clear liquids lasting longer than 1 day call your surgeon Protein Shake  Drink at least 2 ounces of shake 5-6 times per day  Each serving of protein shakes (usually 8-12 ounces) should have a minimum of: o 15 grams of protein o And no more than 5 grams of carbohydrate  Goal for protein each day: o Men = 80 grams per day o Women = 60 grams per day     Protein powder may be added to fluids such as non-fat milk or Lactaid milk or Soy milk (limit to 35 grams added protein powder per serving)  Hydration  Slowly increase the amount of water and other clear liquids as tolerated (See Acceptable Fluids)  Slowly increase the amount of protein shake as tolerated  Sip fluids slowly and throughout the day  May use sugar substitutes in small amounts (no more than 6-8 packets per day; i.e. Splenda)  Fluid Goal  The first goal is to drink at least 8 ounces of protein shake/drink per day (or as directed by the nutritionist); some examples of protein shakes are [employer], [employer], EAS Edge HP, and Unjury. - See handout from pre-op Bariatric Education Class: o Slowly increase the amount of protein shake you drink as tolerated o You may find it easier to slowly sip shakes throughout the day o It is important to get your proteins in first  Your fluid goal is to drink 64-100 ounces of fluid daily o It may take a few weeks to build up to this   32 oz. (or more) should be clear liquids And  32 oz. (or more) should be full liquids (see below for examples)  Liquids should not contain sugar, caffeine, or carbonation  Clear  Liquids:  Water of Sugar-free flavored water (i.e. Fruit HO, Propel)  Decaffeinated coffee or tea (sugar-free)  Crystal lite, Wylers Lite, Minute Maid Lite  Sugar-free Jell-O  Bouillon or broth  Sugar-free Popsicle:    - Less than 20 calories each; Limit 1 per day  Full Liquids:                   Protein Shakes/Drinks + 2 choices per day of other full liquids  Full liquids must be: o No More Than 12 grams of Carbs per serving o No More Than 3 grams of Fat per serving  Strained low-fat cream soup  Non-Fat milk  Fat-free Lactaid Milk  Sugar-free yogurt (Dannon Lite & Fit, Greek yogurt)    Vitamins and Minerals  Start 1 day after surgery unless otherwise directed by your surgeon  2 Chewable Multivitamin / Multimineral Supplement with iron (i.e. Centrum for Adults)  Vitamin  B-12, 350-500 micrograms sub-lingual (place tablet under the tongue) each day  Chewable Calcium Citrate with Vitamin D-3 (Example: 3 Chewable Calcium  Plus 600 with Vitamin D-3) o Take 500 mg three (3) times a day for a total of 1500 mg each day o Do not take all 3 doses of calcium at one time as it may cause constipation, and you can only absorb 500 mg at a time o Do not mix multivitamins containing iron with calcium supplements;  take 2 hours apart o Do not substitute Tums (calcium carbonate) for your calcium  Menstruating women and those at risk for anemia ( a blood disease that causes weakness) may need extra iron o Talk to your doctor to see if you need more iron  If you need extra iron: Total daily Iron recommendation (including Vitamins) is 50 to 100 mg Iron/day  Do not stop taking or change any vitamins or minerals until you talk to your nutritionist or surgeon  Your nutritionist and/or surgeon must approve all vitamin and mineral supplements   Activity and Exercise: It is important to continue walking at home. Limit your physical activity as instructed by your doctor. During this time,  use these guidelines:  Do not lift anything greater than ten  (10) pounds for at least two (2) weeks  Do not go back to work or drive until Engineer, production says you can  You may have sex when you feel comfortable o It is VERY important for female patients to use a reliable birth control method; fertility often increase after surgery o Do not get pregnant for at least 18 months  Start exercising as soon as your doctor tells you that you can o Make sure your doctor approves any physical activity  Start with a simple walking program  Walk 5-15 minutes each day, 7 days per week  Slowly increase until you are walking 30-45 minutes per day  Consider joining our [place] program. [phone] or email [email or web address]   Special Instructions Things to remember:  Free counseling is available for you and your family through collaboration between [facility] and [place]. Please call [phone] and leave a message  Use your CPAP when sleeping if this applies to you  Consider buying a medical alert bracelet that says you had lap-band surgery     You will likely have your first fill (fluid added to your band) 6 - 8 weeks after surgery  [facility] [place] has a free Bariatric Surgery Support Group that meets monthly, the 3rd Thursday, [place]. You can see classes online at [email or web address]  It is very important to keep all follow up appointments with your surgeon, nutritionist, primary care physician, and behavioral health practitioner o After the first year, please follow up with your bariatric surgeon and nutritionist at least once a year in order to maintain best weight loss results                    [facility]:  [place]: [phone]               Bariatric Nurse Coordinator: [phone]  Gastric Bypass/Sleeve Home Care Instructions  Rev. 03/2012  Reviewed and Endorsed                                                    by [facility] Patient Education Committee, Jan, 2014

## 2014-11-09 NOTE — Progress Notes (Signed)
Patient alert and oriented, pain is controlled. Patient is tolerating fluids,  advanced to protein shake today, patient tolerated well.  Reviewed Gastric sleeve discharge instructions with patient and patient is able to articulate understanding.  Provided information on BELT program, Support Group and [facility] outpatient pharmacy. All questions answered, will continue to monitor.  

## 2014-11-09 NOTE — Progress Notes (Signed)
2 Days Post-Op  Subjective: Feels a lot better. Nausea significantly improved. No problems with water. ambulated  Objective: Vital signs in last 24 hours: Temp:  [98 F (36.7 C)-99 F (37.2 C)] 99 F (37.2 C) (09/14 0605) Pulse Rate:  [69-91] 91 (09/14 0605) Resp:  [15-18] 18 (09/14 0605) BP: (113-136)/(64-75) 136/66 mmHg (09/14 0605) SpO2:  [98 %-100 %] 98 % (09/14 0605) Weight:  [155.856 kg (343 lb 9.6 oz)] 155.856 kg (343 lb 9.6 oz) (09/14 0605) Last BM Date: 11/07/14  Intake/Output from previous day: 09/13 0701 - 09/14 0700 In: 2440 [P.O.:240; I.V.:2000; IV Piggyback:200] Out: 650 [Urine:650] Intake/Output this shift:    Looks very comfortable, nontoxic, smiling cta  Reg Obese, soft, nd, incisions c/d/i, not really tender +SCDs  Lab Results:   Recent Labs  11/08/14 0555 11/08/14 1640 11/09/14 0717  WBC 8.2  --  6.8  HGB 12.6 13.0 12.1  HCT 40.7 40.9 37.0  PLT 271  --  240   BMET  Recent Labs  11/08/14 0555  NA 135  K 4.3  CL 104  CO2 22  GLUCOSE 109*  BUN 9  CREATININE 0.73  CALCIUM 8.9   PT/INR No results for input(s): LABPROT, INR in the last 72 hours. ABG No results for input(s): PHART, HCO3 in the last 72 hours.  Invalid input(s): PCO2, PO2  Studies/Results: No results found.  Anti-infectives: Anti-infectives    Start     Dose/Rate Route Frequency Ordered Stop   11/07/14 0600  levofloxacin (LEVAQUIN) IVPB 750 mg     750 mg 100 mL/hr over 90 Minutes Intravenous On call to O.R. 11/06/14 1445 11/07/14 1622      Assessment/Plan: s/p Procedure(s): LAPAROSCOPIC GASTRIC SLEEVE RESECTION HIATAL HERNIA  REPAIR/ UPPER ENDO (N/A) UPPER GI ENDOSCOPY  Looks good. No fever. No tachycardia Adv to POD 2 diet If does well with volume intake anticiptate dc later today Discussed dc instructions and importance of ambulation  [name]. Redmond Pulling, MD, FACS General, Bariatric, & Minimally Invasive Surgery [facility], [place]   LOS: 2 days     Cynthia Vazquez 11/09/2014

## 2014-11-09 NOTE — Discharge Summary (Signed)
Physician Discharge Summary  Cynthia Vazquez OMV:[id number] DOB: 19-Feb-1971 DOA: 11/07/2014  PCP: [name], MD  Admit date: 11/07/2014 Discharge date: 11/09/2014  Recommendations for Outpatient Follow-up:    Follow-up Information    Follow up with Gayland Curry, MD. Go on 11/23/2014.   Specialty:  General Surgery   Why:  For Post-Op Check at 11:00 AM   Contact information:   [address] [place] [address] [phone]      Discharge Diagnoses:  Principal Problem:   Morbid obesity with BMI of 50.0-59.9, adult Active Problems:   HTN (hypertension)   Prediabetes   Hiatal hernia   Lumbar and sacral osteoarthritis   S/P laparoscopic sleeve gastrectomy   Surgical Procedure: Laparoscopic Sleeve Gastrectomy with hiatal hernia repair, upper endoscopy  Discharge Condition: Good Disposition: Home  Diet recommendation: Postoperative sleeve gastrectomy diet (liquids only)  Filed Weights   11/07/14 1223 11/09/14 0605  Weight: 152.976 kg (337 lb 4 oz) 155.856 kg (343 lb 9.6 oz)     Hospital Course:  The patient was admitted for a planned laparoscopic sleeve gastrectomy.She was also found to have a small hiatal hernia that I repaired as well.  Please see operative note. Preoperatively the patient was given 5000 units of subcutaneous heparin for DVT prophylaxis. Postoperative prophylactic Lovenox dosing was started on the morning of postoperative day 1. On POD 1, the patient had no fever or tachycardia and was started on water which she tolerated.She had the usual nausea POD 0-1 and it was controlled with antiemetics and by switching her IV pain medication.  On postoperative day 2 The patient's diet was advanced to protein shakes which they also tolerated. The patient was ambulating without difficulty. Their vital signs are stable without fever or tachycardia. Their hemoglobin had remained stable.Her nausea had greatly improved. The patient had received discharge  instructions and counseling. They were deemed stable for discharge.   Discharge Instructions  Discharge Instructions    Ambulate hourly while awake    Complete by:  As directed      Call MD for:  difficulty breathing, headache or visual disturbances    Complete by:  As directed      Call MD for:  persistant dizziness or light-headedness    Complete by:  As directed      Call MD for:  persistant nausea and vomiting    Complete by:  As directed      Call MD for:  redness, tenderness, or signs of infection (pain, swelling, redness, odor or green/yellow discharge around incision site)    Complete by:  As directed      Call MD for:  severe uncontrolled pain    Complete by:  As directed      Call MD for:  temperature >101 F    Complete by:  As directed      Diet bariatric full liquid    Complete by:  As directed      Discharge instructions    Complete by:  As directed   See bariatric surgery discharge instructions Monitor blood pressure daily. If the top number (systolic) is consistently greater than 160 mmHg &/or the bottom number (diastolic) is consistently greater than 100 mmHg, please call your primary care doctor and restart your blood pressure medication     Incentive spirometry    Complete by:  As directed   Perform hourly while awake            Medication List  STOP taking these medications        aspirin 81 MG chewable tablet     glimepiride 2 MG tablet  Commonly known as:  AMARYL     ibuprofen 200 MG tablet  Commonly known as:  ADVIL,MOTRIN     lisinopril-hydrochlorothiazide 20-12.5 MG per tablet  Commonly known as:  PRINZIDE,ZESTORETIC     polyethylene glycol powder powder  Commonly known as:  GLYCOLAX/MIRALAX     tranexamic acid 650 MG Tabs tablet  Commonly known as:  LYSTEDA      TAKE these medications        cholecalciferol 1000 UNITS tablet  Commonly known as:  VITAMIN D  Take 1,000 Units by mouth daily as needed (low vit d levels).      fluocinonide 0.05 % external solution  Commonly known as:  LIDEX  Apply 1 application topically 2 (two) times daily as needed (for itchy scalp).     fluticasone 50 MCG/ACT nasal spray  Commonly known as:  FLONASE  Place 2 sprays into both nostrils daily.     loratadine 10 MG tablet  Commonly known as:  CLARITIN  Take 10 mg by mouth daily as needed for allergies.     oxyCODONE 5 MG/5ML solution  Commonly known as:  ROXICODONE  Take 5-10 mLs (5-10 mg total) by mouth every 4 (four) hours as needed for moderate pain or severe pain.           Follow-up Information    Follow up with Gayland Curry, MD. Go on 11/23/2014.   Specialty:  General Surgery   Why:  For Post-Op Check at 11:00 AM   Contact information:   [facility] [place] [address] [phone]        The results of significant diagnostics from this hospitalization (including imaging, microbiology, ancillary and laboratory) are listed below for reference.    Significant Diagnostic Studies: No results found.  Labs: Basic Metabolic Panel:  Recent Labs Lab 11/08/14 0555  NA 135  K 4.3  CL 104  CO2 22  GLUCOSE 109*  BUN 9  CREATININE 0.73  CALCIUM 8.9   Liver Function Tests:  Recent Labs Lab 11/08/14 0555  AST 74*  ALT 57*  ALKPHOS 74  BILITOT 0.4  PROT 7.3  ALBUMIN 3.5    CBC:  Recent Labs Lab 11/07/14 1805 11/08/14 0555 11/08/14 1640 11/09/14 0717  WBC  --  8.2  --  6.8  NEUTROABS  --  6.3  --  3.6  HGB 12.5 12.6 13.0 12.1  HCT 38.4 40.7 40.9 37.0  MCV  --  78.7  --  76.4*  PLT  --  271  --  240    CBG:  Recent Labs Lab 11/08/14 2156 11/09/14 0007 11/09/14 0347 11/09/14 0717 11/09/14 1204  GLUCAP 110* 83 79 87 87    Principal Problem:   Morbid obesity with BMI of 50.0-59.9, adult Active Problems:   HTN (hypertension)   Prediabetes   Hiatal hernia   Lumbar and sacral osteoarthritis   S/P laparoscopic sleeve gastrectomy   Time coordinating discharge:  15 minutes  Signed:  Gayland Curry, MD [facility], [place] [phone] 11/09/2014, 2:28 PM

## 2014-11-09 NOTE — Progress Notes (Signed)
Hypoglycemic Event  CBG: 67  Treatment: D50 IV 25 mL  Symptoms: None  Follow-up CBG: Time:2200 CBG Result:110  Possible Reasons for Event: Inadequate meal intake  Comments/MD notified:     Redge Gainer  Remember to initiate Hypoglycemia Order Set & complete

## 2014-11-10 ENCOUNTER — Ambulatory Visit: Payer: BLUE CROSS/BLUE SHIELD | Admitting: Hematology and Oncology

## 2014-11-10 ENCOUNTER — Ambulatory Visit: Payer: BLUE CROSS/BLUE SHIELD

## 2014-11-14 ENCOUNTER — Telehealth (HOSPITAL_COMMUNITY): Payer: Self-pay

## 2014-11-14 NOTE — Telephone Encounter (Signed)
Made discharge phone call to patient per DROP protocol. Asking the following questions.    1. Do you have someone to care for you now that you are home?  yes 2. Are you having pain now that is not relieved by your pain medication?  no 3. Are you able to drink the recommended daily amount of fluids (48 ounces minimum/day) and protein (60-80 grams/day) as prescribed by the dietitian or nutritional counselor?  i am working on it, but it is hard, yesterday I actually got all 60 grams 4. Are you taking the vitamins and minerals as prescribed?  yes 5. Do you have the "on call" number to contact your surgeon if you have a problem or question?  yes 6. Are your incisions free of redness, swelling or drainage? (If steri strips, address that these can fall off, shower as tolerated) yes 7. Have your bowels moved since your surgery?  If not, are you passing gas?  yes 8. Are you up and walking 3-4 times per day?  yes

## 2014-11-17 ENCOUNTER — Ambulatory Visit: Payer: BLUE CROSS/BLUE SHIELD

## 2014-11-18 ENCOUNTER — Emergency Department (HOSPITAL_COMMUNITY)
Admission: EM | Admit: 2014-11-18 | Discharge: 2014-11-18 | Disposition: A | Discharge status: Home / Self Care | Payer: BLUE CROSS/BLUE SHIELD | Attending: Emergency Medicine | Admitting: Emergency Medicine

## 2014-11-18 ENCOUNTER — Telehealth: Payer: Self-pay | Admitting: Hematology and Oncology

## 2014-11-18 ENCOUNTER — Encounter (HOSPITAL_COMMUNITY): Payer: Self-pay | Admitting: Emergency Medicine

## 2014-11-18 ENCOUNTER — Telehealth: Payer: Self-pay | Admitting: *Deleted

## 2014-11-18 DIAGNOSIS — E119 Type 2 diabetes mellitus without complications: Secondary | ICD-10-CM | POA: Diagnosis not present

## 2014-11-18 DIAGNOSIS — R531 Weakness: Secondary | ICD-10-CM | POA: Diagnosis present

## 2014-11-18 DIAGNOSIS — Z862 Personal history of diseases of the blood and blood-forming organs and certain disorders involving the immune mechanism: Secondary | ICD-10-CM | POA: Insufficient documentation

## 2014-11-18 DIAGNOSIS — I10 Essential (primary) hypertension: Secondary | ICD-10-CM | POA: Diagnosis not present

## 2014-11-18 DIAGNOSIS — R5383 Other fatigue: Secondary | ICD-10-CM | POA: Diagnosis not present

## 2014-11-18 DIAGNOSIS — Z79899 Other long term (current) drug therapy: Secondary | ICD-10-CM | POA: Insufficient documentation

## 2014-11-18 DIAGNOSIS — Z8719 Personal history of other diseases of the digestive system: Secondary | ICD-10-CM | POA: Insufficient documentation

## 2014-11-18 DIAGNOSIS — E669 Obesity, unspecified: Secondary | ICD-10-CM | POA: Insufficient documentation

## 2014-11-18 DIAGNOSIS — R42 Dizziness and giddiness: Secondary | ICD-10-CM | POA: Diagnosis not present

## 2014-11-18 LAB — BASIC METABOLIC PANEL
Anion gap: 8 (ref 5–15)
BUN: 10 mg/dL (ref 6–20)
CALCIUM: 9 mg/dL (ref 8.9–10.3)
CO2: 24 mmol/L (ref 22–32)
Chloride: 106 mmol/L (ref 101–111)
Creatinine, Ser: 0.72 mg/dL (ref 0.44–1.00)
GFR calc Af Amer: >60 mL/min (ref >60)
GLUCOSE: 99 mg/dL (ref 65–99)
Potassium: 4.2 mmol/L (ref 3.5–5.1)
Sodium: 138 mmol/L (ref 135–145)

## 2014-11-18 LAB — CBC WITH DIFFERENTIAL/PLATELET
BASOS PCT: 1 %
Basophils Absolute: 0.1 10*3/uL (ref 0.0–0.1)
EOS PCT: 6 %
Eosinophils Absolute: 0.3 10*3/uL (ref 0.0–0.7)
HCT: 34.4 % — ABNORMAL LOW (ref 36.0–46.0)
HEMOGLOBIN: 10.8 g/dL — AB (ref 12.0–15.0)
Lymphocytes Relative: 31 %
Lymphs Abs: 1.5 10*3/uL (ref 0.7–4.0)
MCH: 24 pg — AB (ref 26.0–34.0)
MCHC: 31.4 g/dL (ref 30.0–36.0)
MCV: 76.4 fL — AB (ref 78.0–100.0)
MONO ABS: 0.6 10*3/uL (ref 0.1–1.0)
Monocytes Relative: 12 %
Neutro Abs: 2.4 10*3/uL (ref 1.7–7.7)
Neutrophils Relative %: 50 %
PLATELETS: 304 10*3/uL (ref 150–400)
RBC: 4.5 MIL/uL (ref 3.87–5.11)
RDW: 15 % (ref 11.5–15.5)
WBC: 4.9 10*3/uL (ref 4.0–10.5)

## 2014-11-18 NOTE — Telephone Encounter (Signed)
pt called to sched sooner appt.....done...pt ok and aware of new d.t

## 2014-11-18 NOTE — Discharge Instructions (Signed)
Dizziness °Dizziness is a common problem. It is a feeling of unsteadiness or light-headedness. You may feel like you are about to faint. Dizziness can lead to injury if you stumble or fall. A person of any age group can suffer from dizziness, but dizziness is more common in older adults. °CAUSES  °Dizziness can be caused by many different things, including: °· Middle ear problems. °· Standing for too long. °· Infections. °· An allergic reaction. °· Aging. °· An emotional response to something, such as the sight of blood. °· Side effects of medicines. °· Tiredness. °· Problems with circulation or blood pressure. °· Excessive use of alcohol or medicines, or illegal drug use. °· Breathing too fast (hyperventilation). °· An irregular heart rhythm (arrhythmia). °· A low red blood cell count (anemia). °· Pregnancy. °· Vomiting, diarrhea, fever, or other illnesses that cause body fluid loss (dehydration). °· Diseases or conditions such as Parkinson's disease, high blood pressure (hypertension), diabetes, and thyroid problems. °· Exposure to extreme heat. °DIAGNOSIS  °Your health care provider will ask about your symptoms, perform a physical exam, and perform an electrocardiogram (ECG) to record the electrical activity of your heart. Your health care provider may also perform other heart or blood tests to determine the cause of your dizziness. These may include: °· Transthoracic echocardiogram (TTE). During echocardiography, sound waves are used to evaluate how blood flows through your heart. °· Transesophageal echocardiogram (TEE). °· Cardiac monitoring. This allows your health care provider to monitor your heart rate and rhythm in real time. °· Holter monitor. This is a portable device that records your heartbeat and can help diagnose heart arrhythmias. It allows your health care provider to track your heart activity for several days if needed. °· Stress tests by exercise or by giving medicine that makes the heart beat  faster. °TREATMENT  °Treatment of dizziness depends on the cause of your symptoms and can vary greatly. °HOME CARE INSTRUCTIONS  °· Drink enough fluids to keep your urine clear or pale yellow. This is especially important in very hot weather. In older adults, it is also important in cold weather. °· Take your medicine exactly as directed if your dizziness is caused by medicines. When taking blood pressure medicines, it is especially important to get up slowly. °· Rise slowly from chairs and steady yourself until you feel okay. °· In the morning, first sit up on the side of the bed. When you feel okay, stand slowly while holding onto something until you know your balance is fine. °· Move your legs often if you need to stand in one place for a long time. Tighten and relax your muscles in your legs while standing. °· Have someone stay with you for 1-2 days if dizziness continues to be a problem. Do this until you feel you are well enough to stay alone. Have the person call your health care provider if he or she notices changes in you that are concerning. °· Do not drive or use heavy machinery if you feel dizzy. °· Do not drink alcohol. °SEEK IMMEDIATE MEDICAL CARE IF:  °· Your dizziness or light-headedness gets worse. °· You feel nauseous or vomit. °· You have problems talking, walking, or using your arms, hands, or legs. °· You feel weak. °· You are not thinking clearly or you have trouble forming sentences. It may take a friend or family member to notice this. °· You have chest pain, abdominal pain, shortness of breath, or sweating. °· Your vision changes. °· You notice   any bleeding.  You have side effects from medicine that seems to be getting worse rather than better. MAKE SURE YOU:   Understand these instructions.  Will watch your condition.  Will get help right away if you are not doing well or get worse. Document Released: 08/07/2000 Document Revised: 02/16/2013 Document Reviewed: 08/31/2010 [facility]  Patient Information 2015 [place], [place]. This information is not intended to replace advice given to you by your health care provider. Make sure you discuss any questions you have with your health care provider.  Weakness Weakness is a lack of strength. It may be felt all over the body (generalized) or in one specific part of the body (focal). Some causes of weakness can be serious. You may need further medical evaluation, especially if you are elderly or you have a history of immunosuppression (such as chemotherapy or HIV), kidney disease, heart disease, or diabetes. CAUSES  Weakness can be caused by many different things, including:  Infection.  Physical exhaustion.  Internal bleeding or other blood loss that results in a lack of red blood cells (anemia).  Dehydration. This cause is more common in elderly people.  Side effects or electrolyte abnormalities from medicines, such as pain medicines or sedatives.  Emotional distress, anxiety, or depression.  Circulation problems, especially severe peripheral arterial disease.  Heart disease, such as rapid atrial fibrillation, bradycardia, or heart failure.  Nervous system disorders, such as Guillain-Barr syndrome, multiple sclerosis, or stroke. DIAGNOSIS  To find the cause of your weakness, your caregiver will take your history and perform a physical exam. Lab tests or X-rays may also be ordered, if needed. TREATMENT  Treatment of weakness depends on the cause of your symptoms and can vary greatly. HOME CARE INSTRUCTIONS   Rest as needed.  Eat a well-balanced diet.  Try to get some exercise every day.  Only take over-the-counter or prescription medicines as directed by your caregiver. SEEK MEDICAL CARE IF:   Your weakness seems to be getting worse or spreads to other parts of your body.  You develop new aches or pains. SEEK IMMEDIATE MEDICAL CARE IF:   You cannot perform your normal daily activities, such as getting dressed  and feeding yourself.  You cannot walk up and down stairs, or you feel exhausted when you do so.  You have shortness of breath or chest pain.  You have difficulty moving parts of your body.  You have weakness in only one area of the body or on only one side of the body.  You have a fever.  You have trouble speaking or swallowing.  You cannot control your bladder or bowel movements.  You have black or bloody vomit or stools. MAKE SURE YOU:  Understand these instructions.  Will watch your condition.  Will get help right away if you are not doing well or get worse. Document Released: 02/11/2005 Document Revised: 08/13/2011 Document Reviewed: 04/12/2011 [facility] Patient Information 2015 [place], [place]. This information is not intended to replace advice given to you by your health care provider. Make sure you discuss any questions you have with your health care provider.

## 2014-11-18 NOTE — ED Notes (Signed)
Nurse drawing labs. 

## 2014-11-18 NOTE — Telephone Encounter (Signed)
Will await results from ED and forward to Dr. Alvy Bimler.

## 2014-11-18 NOTE — Telephone Encounter (Signed)
VM from pt states she feels "kinda dizzy and tired."  She had Bariatric surgery recently.  She has had a menstrual cycle for 2 weeks.   This RN opened pt's chart to call her back and saw that pt is currently in ED.

## 2014-11-18 NOTE — ED Notes (Signed)
Pt reports weakness, pt had gastric bypass 2 weeks ago, "bariatric sleeve". Pt started on cycle right after surgery and bled for 2 weeks . Pt co weakness and dizziness. Pt denies headache nor vision changes. Pt reports taking her viatmins on daily basis. Pt denies nausea nor emesis.

## 2014-11-18 NOTE — ED Notes (Signed)
Pt ambulated to bathroom with no difficulty 

## 2014-11-18 NOTE — ED Provider Notes (Signed)
CSN: [id number]     Arrival date & time 11/18/14  1029 History   First MD Initiated Contact with Patient 11/18/14 1039     Chief Complaint  Patient presents with  . Weakness   HPI  Ms. Husband is a 44 year old female with PMHx of anemia and obesity presenting with lightheadedness and generalized weakness. Pt had laparoscopic gastric sleeve surgery 11 days ago. Denies pain or drainage from surgical site. Pt reports history of severe dysmenorrhea that would result iron deficiency anemia requiring iron transfusions. Pt sees OBGYN for this and was placed on lysteda to decrease bleeding. Pt had to stop taking the medicine after her surgery and reports she has been on her cycle with heavy bleeding since 9/13. She woke up this morning feeling "woozy" and fatigued which she states is typical when "her blood counts get low". Pt is requesting we draw her Hgb. Denies fevers, chills, headaches, syncope, chest pain, palpitations, SOB, abdominal pain, nausea, vomiting or tenderness over her surgical sites.   Past Medical History  Diagnosis Date  . Hypertension   . Iron deficiency anemia due to chronic blood loss     menstrual cycles  . Diabetes mellitus without complication   . History of hiatal hernia    Past Surgical History  Procedure Laterality Date  . Cholecystectomy    . Cesarean section    . Tubal ligation    . Breath tek h pylori N/A 07/22/2014    Procedure: BREATH TEK H PYLORI;  Surgeon: Greer Pickerel, MD;  Location: [facility];  Service: General;  Laterality: N/A;  . Laparoscopic gastric sleeve resection N/A 11/07/2014    Procedure: LAPAROSCOPIC GASTRIC SLEEVE RESECTION HIATAL HERNIA  REPAIR/ UPPER ENDO;  Surgeon: Greer Pickerel, MD;  Location: [facility];  Service: General;  Laterality: N/A;  . Upper gi endoscopy  11/07/2014    Procedure: UPPER GI ENDOSCOPY;  Surgeon: Greer Pickerel, MD;  Location: [facility];  Service: General;;   Family History  Problem Relation Age of Onset  . Anemia Mother   . Anemia  Paternal Grandfather    Social History  Substance Use Topics  . Smoking status: Never Smoker   . Smokeless tobacco: Never Used  . Alcohol Use: No   OB History    No data available     Review of Systems  Constitutional: Negative for fever, chills and diaphoresis.  HENT: Negative for congestion and sore throat.   Eyes: Negative for visual disturbance.  Respiratory: Negative for shortness of breath.   Cardiovascular: Negative for chest pain.  Gastrointestinal: Negative for nausea, vomiting and abdominal pain.  Genitourinary: Negative for flank pain.  Musculoskeletal: Negative for back pain, arthralgias and gait problem.  Skin: Positive for wound. Negative for color change and rash.  Neurological: Positive for weakness and light-headedness. Negative for syncope and headaches.      Allergies  Shellfish allergy and Augmentin  Home Medications   Prior to Admission medications   Medication Sig Start Date End Date Taking? Authorizing Provider  Calcium Carb-Cholecalciferol (CALCIUM 600 + D PO) Take 1 tablet by mouth 3 (three) times daily.   Yes Historical Provider, MD  Cyanocobalamin (B-12) 3000 MCG CAPS Take 1 capsule by mouth once a week. Wednesday   Yes Historical Provider, MD  fluocinonide (LIDEX) 0.05 % external solution Apply 1 application topically 2 (two) times daily as needed (for itchy scalp).  07/16/14  Yes Historical Provider, MD  fluticasone (FLONASE) 50 MCG/ACT nasal spray Place 2 sprays into both  nostrils daily. Patient taking differently: Place 2 sprays into both nostrils daily as needed.  05/14/14  Yes Wandra Arthurs, MD  Gel Base (SOLU-K) GEL Apply 1 application topically daily as needed (scar).  11/17/14  Yes Historical Provider, MD  loratadine (CLARITIN) 10 MG tablet Take 10 mg by mouth daily as needed for allergies.    Yes Historical Provider, MD  Multiple Vitamins-Minerals (OPTISOURCE POST BARIATRIC SURG) CHEW Chew 2 tablets by mouth 2 (two) times daily.   Yes  Historical Provider, MD  oxyCODONE (ROXICODONE) 5 MG/5ML solution Take 5-10 mLs (5-10 mg total) by mouth every 4 (four) hours as needed for moderate pain or severe pain. 11/09/14  Yes Greer Pickerel, MD  polyethylene glycol powder (GLYCOLAX/MIRALAX) powder Take 17 g by mouth every other day. 11/01/14  Yes Historical Provider, MD   BP 144/99 mmHg  Pulse 70  Temp(Src) 98.2 F (36.8 C) (Oral)  Resp 16  SpO2 99%  LMP 11/07/2014 Physical Exam  Constitutional: She is oriented to person, place, and time. She appears well-developed and well-nourished. No distress.  HENT:  Head: Normocephalic and atraumatic.  Mouth/Throat: Oropharynx is clear and moist. No oropharyngeal exudate.  Eyes: Conjunctivae and EOM are normal. Pupils are equal, round, and reactive to light. Right eye exhibits no discharge. Left eye exhibits no discharge. No scleral icterus.  Neck: Normal range of motion.  Cardiovascular: Normal rate, regular rhythm and normal heart sounds.   Pulmonary/Chest: Effort normal and breath sounds normal. No respiratory distress. She has no wheezes. She has no rales.  Abdominal: Soft. Bowel sounds are normal. She exhibits no distension. There is no tenderness. There is no rebound and no guarding.  Multiple well healing laparascopic surgical sites. No erythema or drainage from wounds  Musculoskeletal: Normal range of motion.  Neurological: She is alert and oriented to person, place, and time. No cranial nerve deficit. Coordination normal.  Cranial nerves 3-12 intact. 5/5 motor strength BUE and BLE. Sensation to light touch intact. Pt walks with steady gait unassisted.   Skin: Skin is warm and dry.  No pallor  Psychiatric: She has a normal mood and affect. Her behavior is normal.  Nursing note and vitals reviewed.   ED Course  Procedures (including critical care time) Labs Review Labs Reviewed  CBC WITH DIFFERENTIAL/PLATELET - Abnormal; Notable for the following:    Hemoglobin 10.8 (*)    HCT  34.4 (*)    MCV 76.4 (*)    MCH 24.0 (*)    All other components within normal limits  BASIC METABOLIC PANEL    Imaging Review No results found. I have personally reviewed and evaluated these images and lab results as part of my medical decision-making.   EKG Interpretation None      MDM   Final diagnoses:  Lightheadedness  Other fatigue   Pt presenting with lightheadedness, generalized weakness and fatigue x 1 day. She reports she has had recent heavy periods which have caused IDA in the past. VSS. Pt nontoxic appearing. Non-focal neuro exam. Pt walks with steady gait unassisted. Hgb 10.8. Discussed this with pt and she is happy with this and requesting to go home. Pt has follow up appointment with her OBGYN this week to discuss restarting her dysmenorrhea medications. Return precautions given in discharge paperwork and discussed with pt at bedside. Pt stable for discharge    Josephina Gip, PA-C 11/18/14 [place], [place] 11/19/14 [phone]

## 2014-11-21 ENCOUNTER — Other Ambulatory Visit: Payer: Self-pay | Admitting: Hematology and Oncology

## 2014-11-21 ENCOUNTER — Telehealth: Payer: Self-pay | Admitting: *Deleted

## 2014-11-21 NOTE — Telephone Encounter (Signed)
Informed pt of appts on Monday.  Informed her Dr. Alvy Bimler has reviewed her labs and thinks it is ok to wait until Monday to see her.  Pt verbalized understanding and states she has appt later this week to see her GYN.   Her menstrual bleeding has slowed down some and she is feeling a little less anxious.

## 2014-11-21 NOTE — Telephone Encounter (Signed)
-----   Message from Heath Lark, MD sent at 11/21/2014 11:27 AM EDT ----- Regarding: FW: pt concerns There is no room to treat I placed order for labs and iV iron next week Please let her know her hemoglobin is not too bad ----- Message -----    From: Ilean China    Sent: 11/18/2014   3:29 PM      To: Barbaraann Cao, MD Subject: pt concerns                                    Hello,  [name] called to sched appt. She just had bariatric surgery. Her menstrual cycle started and she is passing clots. I gave her an appt on 10.3.16 however she is seeking a sooner appt.

## 2014-11-22 ENCOUNTER — Encounter: Payer: BLUE CROSS/BLUE SHIELD | Attending: General Surgery

## 2014-11-22 ENCOUNTER — Telehealth: Payer: Self-pay | Admitting: Hematology and Oncology

## 2014-11-22 VITALS — Ht 68.5 in | Wt 328.0 lb

## 2014-11-22 DIAGNOSIS — Z713 Dietary counseling and surveillance: Secondary | ICD-10-CM | POA: Diagnosis not present

## 2014-11-22 DIAGNOSIS — Z6841 Body Mass Index (BMI) 40.0 and over, adult: Secondary | ICD-10-CM | POA: Diagnosis not present

## 2014-11-22 NOTE — Telephone Encounter (Signed)
s.w. pt and r/s 2nd iron.Marland KitchenMarland KitchenMarland KitchenMarland Kitchenpt ok and aware of new d.t

## 2014-11-22 NOTE — Progress Notes (Signed)
Bariatric Class:  Appt start time: 1530 end time:  1630.  2 Week Post-Operative Nutrition Class  Patient was seen on 11/22/14 for Post-Operative Nutrition education at [facility].   Surgery date: 11/07/2014 Surgery type: Sleeve Gastrectomy Start weight at [facility]: 355 lbs on 07/30/14 Weight today: 328 lbs  Weight change: 22 lbs  TANITA  BODY COMP RESULTS  10/03/14 11/22/14   BMI (kg/m^2) 52.4 49.1   Fat Mass (lbs) 193 184.5   Fat Free Mass (lbs) 157 143.5   Total Body Water (lbs) 115 105.0    The following the learning objectives were met by the patient during this course:  Identifies Phase 3A (Soft, High Proteins) Dietary Goals and will begin from 2 weeks post-operatively to 2 months post-operatively  Identifies appropriate sources of fluids and proteins   States protein recommendations and appropriate sources post-operatively  Identifies the need for appropriate texture modifications, mastication, and bite sizes when consuming solids  Identifies appropriate multivitamin and calcium sources post-operatively  Describes the need for physical activity post-operatively and will follow MD recommendations  States when to call healthcare provider regarding medication questions or post-operative complications  Handouts given during class include:  Phase 3A: Soft, High Protein Diet Handout  Follow-Up Plan: Patient will follow-up at [facility] in 6 weeks for 2 month post-op nutrition visit for diet advancement per MD.

## 2014-11-28 ENCOUNTER — Ambulatory Visit (HOSPITAL_BASED_OUTPATIENT_CLINIC_OR_DEPARTMENT_OTHER): Payer: BLUE CROSS/BLUE SHIELD

## 2014-11-28 ENCOUNTER — Telehealth: Payer: Self-pay | Admitting: *Deleted

## 2014-11-28 ENCOUNTER — Other Ambulatory Visit (HOSPITAL_BASED_OUTPATIENT_CLINIC_OR_DEPARTMENT_OTHER): Payer: BLUE CROSS/BLUE SHIELD

## 2014-11-28 ENCOUNTER — Ambulatory Visit (HOSPITAL_BASED_OUTPATIENT_CLINIC_OR_DEPARTMENT_OTHER): Payer: BLUE CROSS/BLUE SHIELD | Admitting: Hematology and Oncology

## 2014-11-28 ENCOUNTER — Encounter: Payer: Self-pay | Admitting: Hematology and Oncology

## 2014-11-28 ENCOUNTER — Telehealth: Payer: Self-pay | Admitting: Hematology and Oncology

## 2014-11-28 VITALS — BP 143/84 | HR 71 | Temp 98.2°F | Resp 17 | Ht 68.5 in | Wt 324.0 lb

## 2014-11-28 VITALS — BP 122/72 | HR 65 | Temp 98.1°F | Resp 18

## 2014-11-28 DIAGNOSIS — D500 Iron deficiency anemia secondary to blood loss (chronic): Secondary | ICD-10-CM

## 2014-11-28 DIAGNOSIS — Z9884 Bariatric surgery status: Secondary | ICD-10-CM

## 2014-11-28 LAB — CBC & DIFF AND RETIC
BASO%: 1.4 % (ref 0.0–2.0)
BASOS ABS: 0.1 10*3/uL (ref 0.0–0.1)
EOS ABS: 0.4 10*3/uL (ref 0.0–0.5)
EOS%: 7.6 % — ABNORMAL HIGH (ref 0.0–7.0)
HEMATOCRIT: 37.2 % (ref 34.8–46.6)
HGB: 11.6 g/dL (ref 11.6–15.9)
IMMATURE RETIC FRACT: 15.8 % — AB (ref 1.60–10.00)
LYMPH%: 32.9 % (ref 14.0–49.7)
MCH: 23.9 pg — AB (ref 25.1–34.0)
MCHC: 31.2 g/dL — ABNORMAL LOW (ref 31.5–36.0)
MCV: 76.7 fL — AB (ref 79.5–101.0)
MONO#: 0.5 10*3/uL (ref 0.1–0.9)
MONO%: 9.5 % (ref 0.0–14.0)
NEUT%: 48.6 % (ref 38.4–76.8)
NEUTROS ABS: 2.5 10*3/uL (ref 1.5–6.5)
Platelets: 357 10*3/uL (ref 145–400)
RBC: 4.85 10*6/uL (ref 3.70–5.45)
RDW: 15.5 % — ABNORMAL HIGH (ref 11.2–14.5)
Retic %: 2.17 % — ABNORMAL HIGH (ref 0.70–2.10)
Retic Ct Abs: 105.25 10*3/uL — ABNORMAL HIGH (ref 33.70–90.70)
WBC: 5.2 10*3/uL (ref 3.9–10.3)
lymph#: 1.7 10*3/uL (ref 0.9–3.3)

## 2014-11-28 LAB — FERRITIN CHCC: FERRITIN: 99 ng/mL (ref 9–269)

## 2014-11-28 MED ORDER — SODIUM CHLORIDE 0.9 % IV SOLN
510.0000 mg | Freq: Once | INTRAVENOUS | Status: AC
  Administered 2014-11-28: 510 mg via INTRAVENOUS
  Filled 2014-11-28: qty 17

## 2014-11-28 MED ORDER — SODIUM CHLORIDE 0.9 % IV SOLN
Freq: Once | INTRAVENOUS | Status: AC
  Administered 2014-11-28: 15:00:00 via INTRAVENOUS

## 2014-11-28 NOTE — Patient Instructions (Signed)

## 2014-11-28 NOTE — Progress Notes (Signed)
[facility] OFFICE PROGRESS NOTE  Cynthia Coma, MD SUMMARY OF HEMATOLOGIC HISTORY:  She was found to have abnormal CBC from routine blood work. In November 2008, she has anemia with hemoglobin of 9.7, MCV of 58.4 with platelet count elevation at 469. On 07/13/2013, she is still anemic with hemoglobin 9.1, MCV of 60.7 and elevated platelet count of 434. Iron studies performed in her physician's office was very low. She denies recent chest pain on exertion, shortness of breath on minimal exertion, or palpitations. She did complain of profound weakness, dizziness as well as new onset of leg cramps. She had not noticed any recent bleeding such as epistaxis, hematuria or hematochezia. She does complain of excessive menstruation. She has periods 7-10 days every month with significant heavy periods in the first few days of each menstrual cycle. On 07/15/2013 and 09/16/2013, she received intravenous iron. On 05/09/2014-05/16/2014, she received IV Feraheme On 11/07/2014, she underwent laparoscopic sleeve gastrectomy with hiatal hernia repair. INTERVAL HISTORY: Cynthia Vazquez 44 y.o. female returns for further follow-up. She have recent significant menorrhagia and was not able to swallow traxenamic acid to stop the bleeding. She is doing well from recent surgery and have started to lose significant amount of weight The patient denies any recent signs or symptoms of bleeding such as spontaneous epistaxis, hematuria or hematochezia.   I have reviewed the past medical history, past surgical history, social history and family history with the patient and they are unchanged from previous note.  ALLERGIES:  is allergic to shellfish allergy and augmentin.  MEDICATIONS:  Current Outpatient Prescriptions  Medication Sig Dispense Refill  . Calcium Carb-Cholecalciferol (CALCIUM 600 + D PO) Take 1 tablet by mouth 3 (three) times daily.    . Cyanocobalamin (B-12) 3000 MCG CAPS Take 1 capsule by  mouth once a week. Wednesday    . fluocinonide (LIDEX) 0.05 % external solution Apply 1 application topically 2 (two) times daily as needed (for itchy scalp).   0  . fluticasone (FLONASE) 50 MCG/ACT nasal spray Place 2 sprays into both nostrils daily. (Patient taking differently: Place 2 sprays into both nostrils daily as needed. ) 16 g 0  . Gel Base (SOLU-K) GEL Apply 1 application topically daily as needed (scar).   2  . loratadine (CLARITIN) 10 MG tablet Take 10 mg by mouth daily as needed for allergies.     . Multiple Vitamins-Minerals (OPTISOURCE POST BARIATRIC SURG) CHEW Chew 2 tablets by mouth 2 (two) times daily.    Marland Kitchen NEXIUM 40 MG capsule   0  . polyethylene glycol powder (GLYCOLAX/MIRALAX) powder Take 17 g by mouth every other day.  0  . tranexamic acid (LYSTEDA) 650 MG TABS tablet   10  . ondansetron (ZOFRAN-ODT) 4 MG disintegrating tablet   0  . oxyCODONE (ROXICODONE) 5 MG/5ML solution Take 5-10 mLs (5-10 mg total) by mouth every 4 (four) hours as needed for moderate pain or severe pain. (Patient not taking: Reported on 11/28/2014)  0   No current facility-administered medications for this visit.     REVIEW OF SYSTEMS:   Constitutional: Denies fevers, chills or night sweats Eyes: Denies blurriness of vision Ears, nose, mouth, throat, and face: Denies mucositis or sore throat Respiratory: Denies cough, dyspnea or wheezes Cardiovascular: Denies palpitation, chest discomfort or lower extremity swelling Gastrointestinal:  Denies nausea, heartburn or change in bowel habits Skin: Denies abnormal skin rashes Lymphatics: Denies new lymphadenopathy or easy bruising Neurological:Denies numbness, tingling or new weaknesses Behavioral/Psych: Mood is stable,  no new changes  All other systems were reviewed with the patient and are negative.  PHYSICAL EXAMINATION: ECOG PERFORMANCE STATUS: 1 - Symptomatic but completely ambulatory  Filed Vitals:   11/28/14 1244  BP: 143/84  Pulse: 71   Temp: 98.2 F (36.8 C)  Resp: 17   Filed Weights   11/28/14 1244  Weight: 324 lb (146.965 kg)    GENERAL:alert, no distress and comfortable. She is morbidly obese SKIN: skin color, texture, turgor are normal, no rashes or significant lesions EYES: normal, Conjunctiva are pink and non-injected, sclera clear OROPHARYNX:no exudate, no erythema and lips, buccal mucosa, and tongue normal  NECK: supple, thyroid normal size, non-tender, without nodularity LYMPH:  no palpable lymphadenopathy in the cervical, axillary or inguinal LUNGS: clear to auscultation and percussion with normal breathing effort HEART: regular rate & rhythm and no murmurs and no lower extremity edema ABDOMEN:abdomen soft, non-tender and normal bowel sounds. Well-healed surgical scar Musculoskeletal:no cyanosis of digits and no clubbing  NEURO: alert & oriented x 3 with fluent speech, no focal motor/sensory deficits  LABORATORY DATA:  I have reviewed the data as listed Results for orders placed or performed in visit on 11/28/14 (from the past 48 hour(s))  CBC & Diff and Retic     Status: Abnormal   Collection Time: 11/28/14 12:28 PM  Result Value Ref Range   WBC 5.2 3.9 - 10.3 10e3/uL   NEUT# 2.5 1.5 - 6.5 10e3/uL   HGB 11.6 11.6 - 15.9 g/dL   HCT 37.2 34.8 - 46.6 %   Platelets 357 145 - 400 10e3/uL   MCV 76.7 (L) 79.5 - 101.0 fL   MCH 23.9 (L) 25.1 - 34.0 pg   MCHC 31.2 (L) 31.5 - 36.0 g/dL   RBC 4.85 3.70 - 5.45 10e6/uL   RDW 15.5 (H) 11.2 - 14.5 %   lymph# 1.7 0.9 - 3.3 10e3/uL   MONO# 0.5 0.1 - 0.9 10e3/uL   Eosinophils Absolute 0.4 0.0 - 0.5 10e3/uL   Basophils Absolute 0.1 0.0 - 0.1 10e3/uL   NEUT% 48.6 38.4 - 76.8 %   LYMPH% 32.9 14.0 - 49.7 %   MONO% 9.5 0.0 - 14.0 %   EOS% 7.6 (H) 0.0 - 7.0 %   BASO% 1.4 0.0 - 2.0 %   Retic % 2.17 (H) 0.70 - 2.10 %   Retic Ct Abs 105.25 (H) 33.70 - 90.70 10e3/uL   Immature Retic Fract 15.80 (H) 1.60 - 10.00 %    Lab Results  Component Value Date   WBC 5.2  11/28/2014   HGB 11.6 11/28/2014   HCT 37.2 11/28/2014   MCV 76.7* 11/28/2014   PLT 357 11/28/2014    ASSESSMENT & PLAN:  Iron deficiency anemia due to chronic blood loss The most likely cause of her anemia is due to chronic blood loss/malabsorption syndrome. We discussed some of the risks, benefits, and alternatives of intravenous iron infusions. The patient is symptomatic from anemia and the iron level is critically low. She tolerated oral iron supplement poorly and desires to achieved higher levels of iron faster for adequate hematopoesis. Some of the side-effects to be expected including risks of infusion reactions, phlebitis, headaches, nausea and fatigue.  The patient is willing to proceed. Patient education material was dispensed.  Goal is to keep ferritin level greater than 50   S/P laparoscopic sleeve gastrectomy The patient will be at risk of multiple mineral deficiencies. I will add vitamin B-12 level when I recheck her iron study in 3  months. Right now, she is taking high-dose vitamin B-12 supplement   All questions were answered. The patient knows to call the clinic with any problems, questions or concerns. No barriers to learning was detected.  I spent 15 minutes counseling the patient face to face. The total time spent in the appointment was 20 minutes and more than 50% was on counseling.     Cynthia Vanvleck, MD 10/3/20161:30 PM

## 2014-11-28 NOTE — Telephone Encounter (Signed)
Pt confirmed labs/ov per 10/03 POF, gave pt AVS and Calendar.... KJ, sent msg to add [facility]

## 2014-11-28 NOTE — Telephone Encounter (Signed)
Per staff message and POF I have scheduled appts. Advised scheduler of appts. JMW  

## 2014-11-28 NOTE — Assessment & Plan Note (Signed)
The patient will be at risk of multiple mineral deficiencies. I will add vitamin B-12 level when I recheck her iron study in 3 months. Right now, she is taking high-dose vitamin B-12 supplement

## 2014-11-28 NOTE — Assessment & Plan Note (Signed)
The most likely cause of her anemia is due to chronic blood loss/malabsorption syndrome. We discussed some of the risks, benefits, and alternatives of intravenous iron infusions. The patient is symptomatic from anemia and the iron level is critically low. She tolerated oral iron supplement poorly and desires to achieved higher levels of iron faster for adequate hematopoesis. Some of the side-effects to be expected including risks of infusion reactions, phlebitis, headaches, nausea and fatigue.  The patient is willing to proceed. Patient education material was dispensed.  Goal is to keep ferritin level greater than 50  

## 2014-12-05 ENCOUNTER — Ambulatory Visit: Payer: BLUE CROSS/BLUE SHIELD

## 2014-12-07 ENCOUNTER — Ambulatory Visit (HOSPITAL_BASED_OUTPATIENT_CLINIC_OR_DEPARTMENT_OTHER): Payer: BLUE CROSS/BLUE SHIELD

## 2014-12-07 VITALS — BP 144/83 | HR 73 | Temp 98.0°F | Resp 18

## 2014-12-07 DIAGNOSIS — D500 Iron deficiency anemia secondary to blood loss (chronic): Secondary | ICD-10-CM

## 2014-12-07 MED ORDER — FERUMOXYTOL INJECTION 510 MG/17 ML
510.0000 mg | Freq: Once | INTRAVENOUS | Status: AC
  Administered 2014-12-07: 510 mg via INTRAVENOUS
  Filled 2014-12-07: qty 17

## 2014-12-07 MED ORDER — SODIUM CHLORIDE 0.9 % IV SOLN
Freq: Once | INTRAVENOUS | Status: AC
  Administered 2014-12-07: 08:00:00 via INTRAVENOUS

## 2014-12-07 NOTE — Patient Instructions (Signed)

## 2014-12-27 ENCOUNTER — Encounter: Payer: BLUE CROSS/BLUE SHIELD | Attending: General Surgery | Admitting: Dietician

## 2014-12-27 ENCOUNTER — Encounter: Payer: Self-pay | Admitting: Dietician

## 2014-12-27 VITALS — Ht 68.5 in | Wt 309.0 lb

## 2014-12-27 DIAGNOSIS — Z713 Dietary counseling and surveillance: Secondary | ICD-10-CM | POA: Insufficient documentation

## 2014-12-27 DIAGNOSIS — Z6841 Body Mass Index (BMI) 40.0 and over, adult: Secondary | ICD-10-CM | POA: Diagnosis not present

## 2014-12-27 NOTE — Patient Instructions (Addendum)
Goals:  Follow Phase 3B: High Protein + Non-Starchy Vegetables  Eat 3-6 small meals/snacks, every 3-5 hrs  Increase lean protein foods to meet 60g goal  Once you can eat 30 grams of protein in a day, have just one protein shake per day  Increase fluid intake to 64oz +  Avoid drinking 15 minutes before, during and 30 minutes after eating  Aim for >30 min of physical activity daily  Surgery date: 11/07/2014 Surgery type: Sleeve Gastrectomy Start weight at [facility]: 355 lbs on 07/30/14 Weight today: 309.0 lbs  Weight change: 19 lbs Total weight loss: 46 lbs  TANITA  BODY COMP RESULTS  10/03/14 11/22/14 12/27/14   BMI (kg/m^2) 52.4 49.1 46.3   Fat Mass (lbs) 193 184.5 165.0   Fat Free Mass (lbs) 157 143.5 144.0   Total Body Water (lbs) 115 105.0 105.5

## 2014-12-27 NOTE — Progress Notes (Signed)
  Follow-up visit:  8 Weeks Post-Operative Sleeve Gastrectomy Surgery  Medical Nutrition Therapy:  Appt start time: 3557 end time:  1800.  Primary concerns today: Post-operative Bariatric Surgery Nutrition Management. Returns with a 19 lb weight loss. No longer taking diabetes or blood pressure medication. Had been taking blood pressure medication for 17 years! Feeling good overall.   Surgery date: 11/07/2014 Surgery type: Sleeve Gastrectomy Start weight at [facility]: 355 lbs on 07/30/14 Weight today: 309.0 lbs  Weight change: 19 lbs Total weight loss: 46 lbs  TANITA  BODY COMP RESULTS  10/03/14 11/22/14 12/27/14   BMI (kg/m^2) 52.4 49.1 46.3   Fat Mass (lbs) 193 184.5 165.0   Fat Free Mass (lbs) 157 143.5 144.0   Total Body Water (lbs) 115 105.0 105.5    Preferred Learning Style:   No preference indicated   Learning Readiness:   Ready  24-hr recall: B (AM): protein shake-Premier or egg (6-30 g) alternates day Snk (AM): 1/2 yogurt (6 g) L (PM): 1/2 yogurt (6 g) Snk (PM): sometimes another protein shake or 1.5 oz fish with 3 asparagus spears (10-30 g) D (PM): Protein shake or fish and vegetables (10-30 g) Snk (PM): none  Fluid intake: 2 protein shakes (22 oz), 51 oz water   Estimated total protein intake: 60 g or protein shake, may have up to 28 g of additional protein   Medications: see list Supplementation: taking  CBG monitoring: every 3 days Average CBG per patient: 87-92 mg/dl Last patient reported A1c: ~6.1% in the hospital   Using straws: No Drinking while eating: No Hair loss: No Carbonated beverages: No N/V/D/C: No Dumping syndrome: No  Recent physical activity:  Walking 7 x week for 45-60 minutes (split up times), planning to go the BELT starting tomorrow  Progress Towards Goal(s):  In progress.  Handouts given during visit include:  Phase 3B High Protein and Non Starchy Vegetables   Nutritional Diagnosis:  [place]-3.3 Overweight/obesity related to past poor  dietary habits and physical inactivity as evidenced by patient w/ recent sleeve gastrectomy surgery following dietary guidelines for continued weight loss.    Intervention:  Nutrition education/diet advancement. Goals:  Follow Phase 3B: High Protein + Non-Starchy Vegetables  Eat 3-6 small meals/snacks, every 3-5 hrs  Increase lean protein foods to meet 60g goal  Once you can eat 30 grams of protein in a day, have just one protein shake per day  Increase fluid intake to 64oz +  Avoid drinking 15 minutes before, during and 30 minutes after eating  Aim for >30 min of physical activity daily  Teaching Method Utilized:  Visual Auditory Hands on  Barriers to learning/adherence to lifestyle change: none  Demonstrated degree of understanding via:  Teach Back   Monitoring/Evaluation:  Dietary intake, exercise, and body weight. Follow up in 1 months for 3 month post-op visit.

## 2015-01-13 ENCOUNTER — Other Ambulatory Visit: Payer: Self-pay | Admitting: Hematology and Oncology

## 2015-01-31 ENCOUNTER — Encounter: Payer: BLUE CROSS/BLUE SHIELD | Attending: General Surgery | Admitting: Dietician

## 2015-01-31 ENCOUNTER — Encounter: Payer: Self-pay | Admitting: Dietician

## 2015-01-31 VITALS — Ht 68.5 in | Wt 289.5 lb

## 2015-01-31 DIAGNOSIS — Z6841 Body Mass Index (BMI) 40.0 and over, adult: Secondary | ICD-10-CM | POA: Insufficient documentation

## 2015-01-31 DIAGNOSIS — Z713 Dietary counseling and surveillance: Secondary | ICD-10-CM | POA: Diagnosis not present

## 2015-01-31 NOTE — Patient Instructions (Addendum)
Goals:  Follow Phase 3B: High Protein + Non-Starchy Vegetables  Eat 3-6 small meals/snacks, every 3-5 hrs  Increase lean protein foods to meet 60g goal  If you have an egg breakfast, have 2 protein snacks during the day  Once you can eat 30 grams of protein in a day, have just one protein shake per day  Increase fluid intake to 64oz + (on e more bottle of water)  Avoid drinking 15 minutes before, during and 30 minutes after eating  Aim for >30 min of physical activity daily  Surgery date: 11/07/2014 Surgery type: Sleeve Gastrectomy Start weight at [facility]: 355 lbs on 07/30/14 Weight today: 289.5  lbs  Weight change: 19.5 lbs Total weight loss: 65.5 lbs  TANITA  BODY COMP RESULTS  10/03/14 11/22/14 12/27/14 01/31/15   BMI (kg/m^2) 52.4 49.1 46.3 43.4   Fat Mass (lbs) 193 184.5 165.0 148.0   Fat Free Mass (lbs) 157 143.5 144.0 141.5   Total Body Water (lbs) 115 105.0 105.5 103.5

## 2015-01-31 NOTE — Progress Notes (Signed)
  Follow-up visit:  12 Weeks Post-Operative Sleeve Gastrectomy Surgery  Medical Nutrition Therapy:  Appt start time: [id number] end time:  1800  Primary concerns today: Post-operative Bariatric Surgery Nutrition Management. Returns with a 19.5 lb weight loss. Started the BELT program and feeling good.   No longer taking diabetes or blood pressure medication. Had been taking blood pressure medication for 17 years! Feeling good overall.   Surgery date: 11/07/2014 Surgery type: Sleeve Gastrectomy Start weight at [facility]: 355 lbs on 07/30/14 Weight today: 289.5  lbs  Weight change: 19.5 lbs Total weight loss: 65.5 lbs  TANITA  BODY COMP RESULTS  10/03/14 11/22/14 12/27/14 01/31/15   BMI (kg/m^2) 52.4 49.1 46.3 43.4   Fat Mass (lbs) 193 184.5 165.0 148.0   Fat Free Mass (lbs) 157 143.5 144.0 141.5   Total Body Water (lbs) 115 105.0 105.5 103.5    Preferred Learning Style:   No preference indicated   Learning Readiness:   Ready  24-hr recall: B (AM): protein shake-Premier or egg with 1/2 oz chicken and cheese (14-30 g) alternates day Snk (AM):  yogurt (12 g) L (PM): 2 oz grilled chicken with mozzarella cheese and vegetables (14 g) Snk (PM): none or yogurt (0-12 g) D (PM): 1.5-2 oz meat/fish and vegetables (10-14 g) Snk (PM): none or SF pudding   Fluid intake: 1 protein shakes (11 oz) every other day, 51 oz water   Estimated total protein intake: 50-78 g   Medications: see list Supplementation: taking  CBG monitoring: every 3 days Average CBG per patient: 87-92 mg/dl Last patient reported A1c: ~6.1% in the hospital   Using straws: No Drinking while eating: No Hair loss: No Carbonated beverages: No N/V/D/C: No Dumping syndrome: No  Recent physical activity:  BELtT program 3 x week, Walking 7 x week for 45-60 minutes (split up times)  Progress Towards Goal(s):  In progress.  Handouts given during visit include:  none   Nutritional Diagnosis:  [place]-3.3 Overweight/obesity related  to past poor dietary habits and physical inactivity as evidenced by patient w/ recent sleeve gastrectomy surgery following dietary guidelines for continued weight loss.    Intervention:  Nutrition education/diet reinforcement. Goals:  Follow Phase 3B: High Protein + Non-Starchy Vegetables  Eat 3-6 small meals/snacks, every 3-5 hrs  Increase lean protein foods to meet 60g goal  If you have an egg breakfast, have 2 protein snacks during the day  Once you can eat 30 grams of protein in a day, have just one protein shake per day  Increase fluid intake to 64oz + (on e more bottle of water)  Avoid drinking 15 minutes before, during and 30 minutes after eating  Aim for >30 min of physical activity daily  Teaching Method Utilized:  Visual Auditory Hands on  Barriers to learning/adherence to lifestyle change: none  Demonstrated degree of understanding via:  Teach Back   Monitoring/Evaluation:  Dietary intake, exercise, and body weight. Follow up in 3 months for 6 month post-op visit.

## 2015-02-13 ENCOUNTER — Other Ambulatory Visit: Payer: Self-pay | Admitting: Family Medicine

## 2015-02-13 DIAGNOSIS — E039 Hypothyroidism, unspecified: Secondary | ICD-10-CM

## 2015-02-17 ENCOUNTER — Ambulatory Visit
Admission: RE | Admit: 2015-02-17 | Discharge: 2015-02-17 | Disposition: A | Discharge status: Home / Self Care | Payer: BLUE CROSS/BLUE SHIELD | Source: Ambulatory Visit | Attending: Family Medicine | Admitting: Family Medicine

## 2015-02-17 DIAGNOSIS — E039 Hypothyroidism, unspecified: Secondary | ICD-10-CM

## 2015-02-23 ENCOUNTER — Other Ambulatory Visit: Payer: Self-pay | Admitting: Hematology and Oncology

## 2015-02-26 ENCOUNTER — Encounter (HOSPITAL_COMMUNITY): Payer: Self-pay

## 2015-02-26 ENCOUNTER — Emergency Department (HOSPITAL_COMMUNITY)
Admission: EM | Admit: 2015-02-26 | Discharge: 2015-02-26 | Disposition: A | Discharge status: Home / Self Care | Payer: BLUE CROSS/BLUE SHIELD | Attending: Emergency Medicine | Admitting: Emergency Medicine

## 2015-02-26 DIAGNOSIS — I10 Essential (primary) hypertension: Secondary | ICD-10-CM | POA: Diagnosis not present

## 2015-02-26 DIAGNOSIS — K0889 Other specified disorders of teeth and supporting structures: Secondary | ICD-10-CM | POA: Insufficient documentation

## 2015-02-26 DIAGNOSIS — Z862 Personal history of diseases of the blood and blood-forming organs and certain disorders involving the immune mechanism: Secondary | ICD-10-CM | POA: Insufficient documentation

## 2015-02-26 DIAGNOSIS — Z79899 Other long term (current) drug therapy: Secondary | ICD-10-CM | POA: Diagnosis not present

## 2015-02-26 DIAGNOSIS — K029 Dental caries, unspecified: Secondary | ICD-10-CM | POA: Insufficient documentation

## 2015-02-26 DIAGNOSIS — R509 Fever, unspecified: Secondary | ICD-10-CM | POA: Diagnosis not present

## 2015-02-26 DIAGNOSIS — Z7951 Long term (current) use of inhaled steroids: Secondary | ICD-10-CM | POA: Insufficient documentation

## 2015-02-26 DIAGNOSIS — E119 Type 2 diabetes mellitus without complications: Secondary | ICD-10-CM | POA: Insufficient documentation

## 2015-02-26 MED ORDER — HYDROCODONE-ACETAMINOPHEN 5-325 MG PO TABS
2.0000 | ORAL_TABLET | Freq: Once | ORAL | Status: AC
  Administered 2015-02-26: 2 via ORAL
  Filled 2015-02-26: qty 2

## 2015-02-26 MED ORDER — PENICILLIN V POTASSIUM 500 MG PO TABS: 500.0000 mg | ORAL_TABLET | Freq: Four times a day (QID) | ORAL | Status: DC

## 2015-02-26 MED ORDER — IBUPROFEN 600 MG PO TABS: 600.0000 mg | ORAL_TABLET | Freq: Four times a day (QID) | ORAL | Status: DC | PRN

## 2015-02-26 MED ORDER — HYDROCODONE-ACETAMINOPHEN 5-325 MG PO TABS: 1.0000 | ORAL_TABLET | ORAL | Status: DC | PRN

## 2015-02-26 NOTE — ED Provider Notes (Signed)
CSN: [id number]     Arrival date & time 02/26/15  [id number] History   First MD Initiated Contact with Patient 02/26/15 [phone]     Chief Complaint  Patient presents with  . Dental Pain    HPI   Cynthia Vazquez is a 45 y.o. female with a PMH of HTN, DM who presents to the ED with left lower dental pain, which she states started last night and has been constant since that time. She denies exacerbating factors. She states she has tried salt water gargles with minimal symptom relief. She reports fever to 101 last night. She denies difficulty swallowing or handling her secretions.   Past Medical History  Diagnosis Date  . Hypertension   . Iron deficiency anemia due to chronic blood loss     menstrual cycles  . Diabetes mellitus without complication ([place])   . History of hiatal hernia    Past Surgical History  Procedure Laterality Date  . Cholecystectomy    . Cesarean section    . Tubal ligation    . Breath tek h pylori N/A 07/22/2014    Procedure: BREATH TEK H PYLORI;  Surgeon: Greer Pickerel, MD;  Location: [facility];  Service: General;  Laterality: N/A;  . Laparoscopic gastric sleeve resection N/A 11/07/2014    Procedure: LAPAROSCOPIC GASTRIC SLEEVE RESECTION HIATAL HERNIA  REPAIR/ UPPER ENDO;  Surgeon: Greer Pickerel, MD;  Location: [facility];  Service: General;  Laterality: N/A;  . Upper gi endoscopy  11/07/2014    Procedure: UPPER GI ENDOSCOPY;  Surgeon: Greer Pickerel, MD;  Location: [facility];  Service: General;;   Family History  Problem Relation Age of Onset  . Anemia Mother   . Anemia Paternal Grandfather    Social History  Substance Use Topics  . Smoking status: Never Smoker   . Smokeless tobacco: Never Used  . Alcohol Use: No   OB History    No data available      Review of Systems  Constitutional: Positive for fever.  HENT: Positive for dental problem. Negative for trouble swallowing.       Allergies  Shellfish allergy and Augmentin  Home Medications   Prior to Admission  medications   Medication Sig Start Date End Date Taking? Authorizing Provider  Calcium Carb-Cholecalciferol (CALCIUM 600 + D PO) Take 1 tablet by mouth 3 (three) times daily.   Yes Historical Provider, MD  Cyanocobalamin (B-12) 3000 MCG CAPS Take 1 capsule by mouth once a week. Wednesday   Yes Historical Provider, MD  fluocinonide (LIDEX) 0.05 % external solution Apply 1 application topically 2 (two) times daily as needed (for itchy scalp).  07/16/14  Yes Historical Provider, MD  fluticasone (FLONASE) 50 MCG/ACT nasal spray Place 2 sprays into both nostrils daily. Patient taking differently: Place 2 sprays into both nostrils daily as needed.  05/14/14  Yes Wandra Arthurs, MD  Gel Base (SOLU-K) GEL Apply 1 application topically daily as needed (scar).  11/17/14  Yes Historical Provider, MD  levothyroxine (SYNTHROID, LEVOTHROID) 50 MCG tablet Take 50 mcg by mouth daily before breakfast.   Yes Historical Provider, MD  loratadine (CLARITIN) 10 MG tablet Take 10 mg by mouth daily as needed for allergies.    Yes Historical Provider, MD  Multiple Vitamins-Minerals (OPTISOURCE POST BARIATRIC SURG) CHEW Chew 2 tablets by mouth 2 (two) times daily.   Yes Historical Provider, MD  NEXIUM 40 MG capsule Take 40 mg by mouth daily as needed (indigestion.).  10/27/14  Yes Historical Provider, MD  ondansetron (ZOFRAN-ODT) 4 MG disintegrating tablet Take 4 mg by mouth every 8 (eight) hours as needed for nausea or vomiting.  10/27/14  Yes Historical Provider, MD  oxyCODONE (ROXICODONE) 5 MG/5ML solution Take 5-10 mg by mouth every 4 (four) hours as needed for moderate pain or severe pain.   Yes Historical Provider, MD  polyethylene glycol powder (GLYCOLAX/MIRALAX) powder Take 17 g by mouth daily as needed for mild constipation.  11/01/14  Yes Historical Provider, MD  tranexamic acid (LYSTEDA) 650 MG TABS tablet Take 1,950 mg by mouth 3 (three) times daily. x5 days during menstrual cycle. 11/24/14  Yes Historical Provider, MD   HYDROcodone-acetaminophen (NORCO/VICODIN) 5-325 MG tablet Take 1 tablet by mouth every 4 (four) hours as needed. 02/26/15   Marella Chimes, PA-C  ibuprofen (ADVIL,MOTRIN) 600 MG tablet Take 1 tablet (600 mg total) by mouth every 6 (six) hours as needed. 02/26/15   Marella Chimes, PA-C  penicillin v potassium (VEETID) 500 MG tablet Take 1 tablet (500 mg total) by mouth 4 (four) times daily. 02/26/15   Marella Chimes, PA-C    BP 154/87 mmHg  Pulse 88  Temp(Src) 97.9 F (36.6 C) (Oral)  Resp 24  SpO2 100%  LMP 02/05/2015 (Approximate) Physical Exam  Constitutional: She is oriented to person, place, and time. She appears well-developed and well-nourished. No distress.  HENT:  Head: Normocephalic and atraumatic.  Right Ear: Hearing, tympanic membrane, external ear and ear canal normal.  Left Ear: Hearing, tympanic membrane, external ear and ear canal normal.  Nose: Nose normal.  Mouth/Throat: Uvula is midline, oropharynx is clear and moist and mucous membranes are normal. Dental caries present. No dental abscesses. No oropharyngeal exudate, posterior oropharyngeal edema, posterior oropharyngeal erythema or tonsillar abscesses.  TTP to gumline of left lower 2nd and 3rd molar. No significant edema. No abscess. No swelling to floor of mouth.  Eyes: Conjunctivae and EOM are normal. Right eye exhibits no discharge. Left eye exhibits no discharge. No scleral icterus.  Neck: Normal range of motion. Neck supple.  Cardiovascular: Normal rate and regular rhythm.   Pulmonary/Chest: Effort normal and breath sounds normal. No respiratory distress.  Musculoskeletal: Normal range of motion. She exhibits no edema or tenderness.  Neurological: She is alert and oriented to person, place, and time.  Skin: Skin is warm and dry. She is not diaphoretic.  Psychiatric: She has a normal mood and affect. Her behavior is normal.  Nursing note and vitals reviewed.   ED Course  Procedures (including  critical care time)  Labs Review Labs Reviewed - No data to display  Imaging Review No results found.    EKG Interpretation None      MDM   Final diagnoses:  Pain, dental    45 year old female presents with left lower dental pain, which she states started last night. Reports fever to 101 yesterday. Denies difficulty swallowing or handling her secretions. Patient is afebrile. Vital signs stable. On exam, patient has TTP to the gumline of her left lower 2nd and 3rd molar. No significant edema. No abscess. No swelling to floor of mouth to suggest ludwigs. Patient handling her secretions well. Patient is nontoxic and well-appearing, feel she is stable for discharge at this time. Will treat with penicillin (patient states she has taken PCN before and does not have an allergy) and short course of pain medication. Patient to follow up with her dentist for further evaluation and management. Return precautions discussed. Patient verbalizes her understanding and is in agreement  with plan.  BP 154/87 mmHg  Pulse 88  Temp(Src) 97.9 F (36.6 C) (Oral)  Resp 24  SpO2 100%  LMP 02/05/2015 (Approximate)      Marella Chimes, PA-C 02/26/15 [place], MD 02/26/15 1434

## 2015-02-26 NOTE — Discharge Instructions (Signed)
1. Medications: PCN, pain medication, ibuprofen (take with food and alternate taking pain medication and ibuprofen), usual home medications 2. Treatment: rest, drink plenty of fluids, use warm compresses 3. Follow Up: please followup with your dentist in 2-3 days for discussion of your diagnoses and further evaluation after today's visit; if you do not have a primary care doctor use the resource guide provided to find one; please return to the ER for high fever, severe pain or swelling, difficulty swallowing, new or worsening symptoms   Emergency Department Resource Guide 1) Find a Doctor and Pay Out of Pocket Although you won't have to find out who is covered by your insurance plan, it is a good idea to ask around and get recommendations. You will then need to call the office and see if the doctor you have chosen will accept you as a new patient and what types of options they offer for patients who are self-pay. Some doctors offer discounts or will set up payment plans for their patients who do not have insurance, but you will need to ask so you aren't surprised when you get to your appointment.  2) Contact Your Local Health Department Not all health departments have doctors that can see patients for sick visits, but many do, so it is worth a call to see if yours does. If you don't know where your local health department is, you can check in your phone book. The CDC also has a tool to help you locate your state's health department, and many state websites also have listings of all of their local health departments.  3) Find a [facility] If your illness is not likely to be very severe or complicated, you may want to try a walk in clinic. These are popping up all over the country in pharmacies, drugstores, and shopping centers. They're usually staffed by nurse practitioners or physician assistants that have been trained to treat common illnesses and complaints. They're usually fairly quick and  inexpensive. However, if you have serious medical issues or chronic medical problems, these are probably not your best option.  No Primary Care Doctor: - Call Health Connect at  [phone] - they can help you locate a primary care doctor that  accepts your insurance, provides certain services, etc. - Physician Referral Service- [phone]  Chronic Pain Problems: Organization         Address  Phone   Notes  [facility]  [phone] Patients need to be referred by their primary care doctor.   Medication Assistance: Organization         Address  Phone   Notes  [facility] Medication [facility] [place] [place], [place] [address] [phone] --Must be a resident of [facility] -- Must have NO insurance coverage whatsoever (no Medicaid/ Medicare, etc.) -- The pt. MUST have a primary care doctor that directs their care regularly and follows them in the community   Med[facility]ist  [phone]   [employer]  [phone]    Agencies that provide inexpensive medical care: Organization         Address  Phone   Notes  [place]  [phone]   [facility]    [phone]   [facility] [place], [place] [address] [phone]   [place] [address] [place] [phone]   Planned Parenthood    [phone]  [facility]    [phone]   Community Health and [facility]  [address], [place] Phone:  [phone], Fax:  [phone] Hours of Operation:  9 am - 6 pm, M-F.  Also accepts Medicaid/Medicare and self-pay.  [facility] for [place], [address], [place] Phone: [phone], Fax: [phone]. Hours of Operation:  8:30 am - 5:30 pm, M-F.  Also accepts Medicaid and self-pay.  [facility] [address], [place] Phone: [phone]   [place], [place], [place] [phone], Ext. [phone] Mondays & Thursdays: 7-9 AM.  First 15 patients are seen on a first come, first serve basis.    [place] Providers:  Organization         Address  Phone   Notes  [facility] [address], [place] [phone] Also accepts self-pay patients.  [facility] [id number] [place], [place]  [phone]   [place], [address], [place] [phone]   [facility] [address] [place] [phone]   Lucianne Lei [address], [place]   [phone] Only accepts [place] Access [place] patients after they have their name applied to their card.   Self-Pay (no insurance) in [facility]:  Organization         Address  Phone   Notes  Sickle Cell Patients, [facility] [phone]   [facility] [place] [phone]   [facility] [place]  [place], [place],  [phone]   [facility]/Dr. Osei-Bonsu  [address] [place] or [address], [place] [phone] Phone number for both [place] and [place] locations is the same.  Urgent Medical and [facility] [address], [place] [phone]   [facility] [address] [place] or [address] [phone]   [facility] [address] [place] [phone], phone; [phone], fax Sees patients 1st and 3rd Saturday of every month.  Must not qualify for public or private insurance (i.e. Medicaid, Medicare, [place] Health Choice, Veterans' Benefits)  Household income should be no more than 200% of the poverty level The clinic cannot treat you if you are pregnant or  think you are pregnant  Sexually transmitted diseases are not treated at the clinic.    Dental Care: Organization         Address  Phone  Notes  [facility] Department of [facility] [phone] Accepts children up to age 13 who are enrolled in [place] or [place]; pregnant women with a Medicaid card; and children who have applied for Medicaid or [place] Health Choice, but were declined, whose parents can pay a reduced fee at time of service.  [facility] Department of [facility]  [address], [place] [phone] Accepts children up to age 51 who are enrolled in [place] or [place]; pregnant women with a Medicaid card; and children who have applied for Medicaid or [place] Health Choice, but were declined, whose parents can pay a reduced fee at time  of service.  [place] [facility] Access PRO[facility]  [place] [phone] Patients are seen by appointment only. Walk-ins are not accepted. [place] will see patients 59 years of age and older. Monday - Tuesday (8am-5pm) Most Wednesdays (8:30-5pm) $30 per visit, cash only  [facility] Access PRO[facility]  [address], [facility] [phone] Patients are seen by appointment only. Walk-ins are not accepted. [place] will see patients 13 years of age and older. One Wednesday Evening (Monthly: Volunteer Based).  $30 per visit, cash only  [place]  [phone] for adults; Children under age 55, call Graduate Pediatric Dentistry at [phone]. Children aged 51-14, please call [phone] to request a pediatric application.  Dental services are provided in all areas of dental care including fillings, crowns and bridges, complete and partial dentures, implants, gum treatment, root canals, and extractions. Preventive care is also provided. Treatment is provided to both adults  and children. Patients are selected via a lottery and there is often a waiting list.   [facility] [address] [place]  [phone] [email or web address]   [facility] [address] [place], [place] [phone], Ext. [phone] Second and Fourth Thursday of each month, opens at 6:30 AM; Clinic ends at 9 AM.  Patients are seen on a first-come first-served basis, and a limited number are seen during each clinic.   [facility]  [address] [place], [place] [phone]   Eligibility Requirements You must have lived in [place], [place], or [place] counties for at least the last three months.   You cannot be eligible for state or federal sponsored [employer], including [employer], [place], or [employer].   You generally cannot be eligible for healthcare insurance through your employer.    How to apply: Eligibility screenings are held every Tuesday and Wednesday afternoon from 1:00 pm until 4:00 pm. You do not need an appointment for the interview!  [facility] [address], [place], [place]   [place]  [place] Department  [place]  [phone]    Behavioral Health Resources in the Community: Intensive Outpatient Programs Organization         Address  Phone  Notes  [place]. [address], [place], [place] [phone]   [facility] Outpatient [address], [place], [place]   ADS: Alcohol & Drug Svcs [address] [place], [place]   [place] [address],  [place], [place] or [phone]   Substance Abuse Resources Organization         Address  Phone  Notes  Alcohol and Drug Services  [phone]   [place]  [phone]   The [place]     Chinita Pester  [phone]   Residential & Outpatient Substance Abuse Program  [phone]   Psychological Services Organization         Address  Phone  Notes  [facility] [place]  [place]  [phone]   [place] [address] [place] or [phone]    Mobile Crisis Teams Organization         Address  Phone  Notes  [facility], Mobile Crisis Care Unit  [phone]   Assertive  Psychotherapeutic Services  [address] [place], [place]   [facility] [address] [place] [phone]    Self-Help/Support Groups Organization         Address  Phone             Notes  Mental Health Assoc. of [place] - variety of support groups  [place]rent Call for more information  Narcotics Anonymous (NA), [facility]aring [facility] [address], [employer] [place]  2 meetings at this location   Special educational needs teacher         Address  Phone  Notes  [facility]idential [facility] [place],    [place]  [phone]   [facility]  [address] [place] [phone], [place], [place]   [place], [place] [phone] Admissions: 8am-3pm M-F  Incentives Substance [place] [address]    [place], [place] [phone]   [facility] [address] [place], [place], [place]   [facility] [address]  [place], [place]   [facility]ight Programs - Intensive Outpatient [address], [place], [place]   [facility] ([place] [address]  [place], [place] [phone] or [phone]   [facility]idential Treatment Services (RTS) [address], [place], [place] Accepts Medicaid  [facility] [place] [address],  [place] [phone] Substance Abuse/Addiction Treatment   [facility] Organization         Address  Phone  Notes  [facility]  [phone]   Domenic Schwab, PhD [address] Arlis [place], [place]   [phone] or [phone]   [place], [place] [phone]   [facility] [address] [place], [place] [phone] Insurance/Medicaid/sponsorship through [facility] and Families [address], [address] [place]                                    [place], [place] [phone] [place] [address], [place] [phone]    Dr. Adele Schilder  [phone]   [facility] [place] Dept. 1) [address],  2) [place] 3)  [place] [address], [place] [phone]  [phone]   [place] [phone] or [phone] (After Hours)

## 2015-02-26 NOTE — ED Notes (Signed)
Pt presents with c/o dental pain that is shooting pain into her face that started last night. Pt reports she did have a fever last night but none today.

## 2015-03-10 ENCOUNTER — Telehealth: Payer: Self-pay | Admitting: *Deleted

## 2015-03-10 ENCOUNTER — Other Ambulatory Visit (HOSPITAL_BASED_OUTPATIENT_CLINIC_OR_DEPARTMENT_OTHER): Payer: BLUE CROSS/BLUE SHIELD

## 2015-03-10 DIAGNOSIS — D500 Iron deficiency anemia secondary to blood loss (chronic): Secondary | ICD-10-CM | POA: Diagnosis not present

## 2015-03-10 LAB — CBC & DIFF AND RETIC
BASO%: 0.5 % (ref 0.0–2.0)
BASOS ABS: 0 10*3/uL (ref 0.0–0.1)
EOS%: 5.9 % (ref 0.0–7.0)
Eosinophils Absolute: 0.3 10*3/uL (ref 0.0–0.5)
HEMATOCRIT: 40.6 % (ref 34.8–46.6)
HGB: 12.7 g/dL (ref 11.6–15.9)
IMMATURE RETIC FRACT: 4.4 % (ref 1.60–10.00)
LYMPH#: 1.5 10*3/uL (ref 0.9–3.3)
LYMPH%: 35.8 % (ref 14.0–49.7)
MCH: 24.5 pg — ABNORMAL LOW (ref 25.1–34.0)
MCHC: 31.3 g/dL — AB (ref 31.5–36.0)
MCV: 78.4 fL — ABNORMAL LOW (ref 79.5–101.0)
MONO#: 0.6 10*3/uL (ref 0.1–0.9)
MONO%: 14.3 % — ABNORMAL HIGH (ref 0.0–14.0)
NEUT#: 1.9 10*3/uL (ref 1.5–6.5)
NEUT%: 43.5 % (ref 38.4–76.8)
PLATELETS: 241 10*3/uL (ref 145–400)
RBC: 5.18 10*6/uL (ref 3.70–5.45)
RDW: 16 % — AB (ref 11.2–14.5)
RETIC %: 0.78 % (ref 0.70–2.10)
RETIC CT ABS: 40.4 10*3/uL (ref 33.70–90.70)
WBC: 4.3 10*3/uL (ref 3.9–10.3)

## 2015-03-10 LAB — IRON AND TIBC
%SAT: 18 % — AB (ref 21–57)
IRON: 40 ug/dL — AB (ref 41–142)
TIBC: 225 ug/dL — ABNORMAL LOW (ref 236–444)
UIBC: 185 ug/dL (ref 120–384)

## 2015-03-10 LAB — FERRITIN: Ferritin: 104 ng/ml (ref 9–269)

## 2015-03-10 NOTE — Telephone Encounter (Signed)
LVM for pt informing of Dr. Calton Dach message below.  Asked her to return nurse's to let us know if ok to r/s her appts to 3 months.

## 2015-03-10 NOTE — Telephone Encounter (Signed)
-----   Message from Heath Lark, MD sent at 03/10/2015  9:50 AM EST ----- Regarding: cbc She is not anemic and ferritin is good If OK with patient, I will DC appt this month and reschedule to 3 months Please let me know and I can place new POF ----- Message -----    From: [facility] Interface    Sent: 03/10/2015   8:39 AM      To: Heath Lark, MD

## 2015-03-11 LAB — VITAMIN B12: Vitamin B12: 1087 pg/mL — ABNORMAL HIGH (ref 211–946)

## 2015-03-13 ENCOUNTER — Other Ambulatory Visit: Payer: Self-pay | Admitting: Hematology and Oncology

## 2015-03-13 ENCOUNTER — Other Ambulatory Visit: Payer: Self-pay | Admitting: *Deleted

## 2015-03-13 ENCOUNTER — Telehealth: Payer: Self-pay | Admitting: Hematology and Oncology

## 2015-03-13 DIAGNOSIS — D500 Iron deficiency anemia secondary to blood loss (chronic): Secondary | ICD-10-CM

## 2015-03-13 NOTE — Telephone Encounter (Signed)
Spoke with patient and she is aware of her 71month appointments,confirmed with cameo of these dates as pof had wrong date information

## 2015-03-14 ENCOUNTER — Ambulatory Visit: Payer: BLUE CROSS/BLUE SHIELD | Admitting: Hematology and Oncology

## 2015-03-14 ENCOUNTER — Ambulatory Visit: Payer: BLUE CROSS/BLUE SHIELD

## 2015-03-21 ENCOUNTER — Ambulatory Visit: Payer: BLUE CROSS/BLUE SHIELD

## 2015-05-01 ENCOUNTER — Emergency Department (HOSPITAL_COMMUNITY)
Admission: EM | Admit: 2015-05-01 | Discharge: 2015-05-01 | Disposition: A | Discharge status: Home / Self Care | Payer: BLUE CROSS/BLUE SHIELD | Attending: Emergency Medicine | Admitting: Emergency Medicine

## 2015-05-01 ENCOUNTER — Encounter (HOSPITAL_COMMUNITY): Payer: Self-pay | Admitting: Emergency Medicine

## 2015-05-01 DIAGNOSIS — Z862 Personal history of diseases of the blood and blood-forming organs and certain disorders involving the immune mechanism: Secondary | ICD-10-CM | POA: Insufficient documentation

## 2015-05-01 DIAGNOSIS — R42 Dizziness and giddiness: Secondary | ICD-10-CM

## 2015-05-01 DIAGNOSIS — Z79899 Other long term (current) drug therapy: Secondary | ICD-10-CM | POA: Diagnosis not present

## 2015-05-01 DIAGNOSIS — Z3202 Encounter for pregnancy test, result negative: Secondary | ICD-10-CM | POA: Diagnosis not present

## 2015-05-01 DIAGNOSIS — I10 Essential (primary) hypertension: Secondary | ICD-10-CM | POA: Diagnosis not present

## 2015-05-01 DIAGNOSIS — R531 Weakness: Secondary | ICD-10-CM | POA: Diagnosis present

## 2015-05-01 DIAGNOSIS — N920 Excessive and frequent menstruation with regular cycle: Secondary | ICD-10-CM

## 2015-05-01 DIAGNOSIS — E119 Type 2 diabetes mellitus without complications: Secondary | ICD-10-CM | POA: Diagnosis not present

## 2015-05-01 DIAGNOSIS — Z7951 Long term (current) use of inhaled steroids: Secondary | ICD-10-CM | POA: Diagnosis not present

## 2015-05-01 DIAGNOSIS — Z792 Long term (current) use of antibiotics: Secondary | ICD-10-CM | POA: Insufficient documentation

## 2015-05-01 DIAGNOSIS — Z8719 Personal history of other diseases of the digestive system: Secondary | ICD-10-CM | POA: Insufficient documentation

## 2015-05-01 LAB — URINE MICROSCOPIC-ADD ON

## 2015-05-01 LAB — BASIC METABOLIC PANEL
Anion gap: 8 (ref 5–15)
BUN: 14 mg/dL (ref 6–20)
CHLORIDE: 103 mmol/L (ref 101–111)
CO2: 23 mmol/L (ref 22–32)
Calcium: 9.1 mg/dL (ref 8.9–10.3)
Creatinine, Ser: 0.64 mg/dL (ref 0.44–1.00)
GFR calc non Af Amer: >60 mL/min (ref >60)
Glucose, Bld: 94 mg/dL (ref 65–99)
Potassium: 3.6 mmol/L (ref 3.5–5.1)
SODIUM: 134 mmol/L — AB (ref 135–145)

## 2015-05-01 LAB — URINALYSIS, ROUTINE W REFLEX MICROSCOPIC
BILIRUBIN URINE: NEGATIVE
GLUCOSE, UA: NEGATIVE mg/dL
KETONES UR: NEGATIVE mg/dL
Nitrite: NEGATIVE
PH: 6 (ref 5.0–8.0)
Protein, ur: 100 mg/dL — AB
Specific Gravity, Urine: 1.031 — ABNORMAL HIGH (ref 1.005–1.030)

## 2015-05-01 LAB — CBC
HEMATOCRIT: 40.5 % (ref 36.0–46.0)
Hemoglobin: 12.9 g/dL (ref 12.0–15.0)
MCH: 25.4 pg — ABNORMAL LOW (ref 26.0–34.0)
MCHC: 31.9 g/dL (ref 30.0–36.0)
MCV: 79.7 fL (ref 78.0–100.0)
Platelets: 259 10*3/uL (ref 150–400)
RBC: 5.08 MIL/uL (ref 3.87–5.11)
RDW: 16.6 % — ABNORMAL HIGH (ref 11.5–15.5)
WBC: 5.8 10*3/uL (ref 4.0–10.5)

## 2015-05-01 LAB — CBG MONITORING, ED: Glucose-Capillary: 89 mg/dL (ref 65–99)

## 2015-05-01 LAB — PREGNANCY, URINE: Preg Test, Ur: NEGATIVE

## 2015-05-01 MED ORDER — SODIUM CHLORIDE 0.9 % IV BOLUS (SEPSIS)
1000.0000 mL | Freq: Once | INTRAVENOUS | Status: AC
  Administered 2015-05-01: 1000 mL via INTRAVENOUS

## 2015-05-01 NOTE — ED Notes (Signed)
Pt c/o weakness, states she woke up this morning feeling that way. Pt states she is anemic and menstrual is on. Denies n/v.

## 2015-05-01 NOTE — ED Provider Notes (Signed)
CSN: [id number]     Arrival date & time 05/01/15  1402 History   First MD Initiated Contact with Patient 05/01/15 1901     Chief Complaint  Patient presents with  . Weakness    this morning     (Consider location/radiation/quality/duration/timing/severity/associated sxs/prior Treatment) HPI   Cynthia Vazquez is a 45 y.o F HTn, iron Deficiency anemia due to chronic blood loss, menorrhagia, DM who presents to the emergency department today complaining of dizziness and fatigue. Patient states that she started her menstrual cycle this morning and began feeling very weak and dizzy upon standing. Patient tried to lay down and rest but her symptoms did not improve with this. Patient states that this happens to her fairly often due to her severe menorrhagia. Pt takes Lysteda  and iron supplements to help with this. She is concerned that her hemoglobin might be low. Patient has not been eating and drinking well, feels that this is secondary to her bariatric Surgery that she had performed in September.  Denies chest pain, shortness of breath, fever, abdominal pain, syncope, weakness, paresthesias.  Past Medical History  Diagnosis Date  . Hypertension   . Iron deficiency anemia due to chronic blood loss     menstrual cycles  . Diabetes mellitus without complication ([place])   . History of hiatal hernia    Past Surgical History  Procedure Laterality Date  . Cholecystectomy    . Cesarean section    . Tubal ligation    . Breath tek h pylori N/A 07/22/2014    Procedure: BREATH TEK H PYLORI;  Surgeon: Greer Pickerel, MD;  [facility];  Service: General;  Laterality: N/A;  . Laparoscopic gastric sleeve resection N/A 11/07/2014    Procedure: LAPAROSCOPIC GASTRIC SLEEVE RESECTION HIATAL HERNIA  REPAIR/ UPPER ENDO;  Surgeon: Greer Pickerel, MD;  Location: [facility];  Service: General;  Laterality: N/A;  . Upper gi endoscopy  11/07/2014    Procedure: UPPER GI ENDOSCOPY;  Surgeon: Greer Pickerel, MD;  Location: [facility];  Service: General;;   Family History  Problem Relation Age of Onset  . Anemia Mother   . Anemia Paternal Grandfather    Social History  Substance Use Topics  . Smoking status: Never Smoker   . Smokeless tobacco: Never Used  . Alcohol Use: No   OB History    No data available     Review of Systems  All other systems reviewed and are negative.     Allergies  Shellfish allergy and Augmentin  Home Medications   Prior to Admission medications   Medication Sig Start Date End Date Taking? Authorizing Provider  Calcium Carb-Cholecalciferol (CALCIUM 600 + D PO) Take 1 tablet by mouth 3 (three) times daily.    Historical Provider, MD  Cyanocobalamin (B-12) 3000 MCG CAPS Take 1 capsule by mouth once a week. Wednesday    Historical Provider, MD  fluocinonide (LIDEX) 0.05 % external solution Apply 1 application topically 2 (two) times daily as needed (for itchy scalp).  07/16/14   Historical Provider, MD  fluticasone (FLONASE) 50 MCG/ACT nasal spray Place 2 sprays into both nostrils daily. Patient taking differently: Place 2 sprays into both nostrils daily as needed.  05/14/14   Wandra Arthurs, MD  Gel Base (SOLU-K) GEL Apply 1 application topically daily as needed (scar).  11/17/14   Historical Provider, MD  HYDROcodone-acetaminophen (NORCO/VICODIN) 5-325 MG tablet Take 1 tablet by mouth every 4 (four) hours as needed. 02/26/15   Marella Chimes,  PA-C  ibuprofen (ADVIL,MOTRIN) 600 MG tablet Take 1 tablet (600 mg total) by mouth every 6 (six) hours as needed. 02/26/15   Marella Chimes, PA-C  levothyroxine (SYNTHROID, LEVOTHROID) 50 MCG tablet Take 50 mcg by mouth daily before breakfast.    Historical Provider, MD  loratadine (CLARITIN) 10 MG tablet Take 10 mg by mouth daily as needed for allergies.     Historical Provider, MD  Multiple Vitamins-Minerals (OPTISOURCE POST BARIATRIC SURG) CHEW Chew 2 tablets by mouth 2 (two) times daily.    Historical Provider, MD  NEXIUM 40 MG capsule  Take 40 mg by mouth daily as needed (indigestion.).  10/27/14   Historical Provider, MD  ondansetron (ZOFRAN-ODT) 4 MG disintegrating tablet Take 4 mg by mouth every 8 (eight) hours as needed for nausea or vomiting.  10/27/14   Historical Provider, MD  oxyCODONE (ROXICODONE) 5 MG/5ML solution Take 5-10 mg by mouth every 4 (four) hours as needed for moderate pain or severe pain.    Historical Provider, MD  penicillin v potassium (VEETID) 500 MG tablet Take 1 tablet (500 mg total) by mouth 4 (four) times daily. 02/26/15   Marella Chimes, PA-C  polyethylene glycol powder (GLYCOLAX/MIRALAX) powder Take 17 g by mouth daily as needed for mild constipation.  11/01/14   Historical Provider, MD  tranexamic acid (LYSTEDA) 650 MG TABS tablet Take 1,950 mg by mouth 3 (three) times daily. x5 days during menstrual cycle. 11/24/14   Historical Provider, MD   BP 137/66 mmHg  Pulse 51  Temp(Src) 98.3 F (36.8 C) (Oral)  Resp 16  SpO2 100%  LMP 04/29/2015 (Exact Date) Physical Exam  Constitutional: She is oriented to person, place, and time. She appears well-developed and well-nourished. No distress.  HENT:  Head: Normocephalic and atraumatic.  Mouth/Throat: No oropharyngeal exudate.  Eyes: Conjunctivae and EOM are normal. Pupils are equal, round, and reactive to light. Right eye exhibits no discharge. Left eye exhibits no discharge. No scleral icterus.  Neck: Normal range of motion. Neck supple.  Cardiovascular: Normal rate, regular rhythm, normal heart sounds and intact distal pulses.  Exam reveals no gallop and no friction rub.   No murmur heard. Pulmonary/Chest: Effort normal and breath sounds normal. No respiratory distress. She has no wheezes. She has no rales. She exhibits no tenderness.  Abdominal: Soft. She exhibits no distension and no mass. There is no tenderness. There is no rebound and no guarding.  Musculoskeletal: Normal range of motion. She exhibits no edema.  Lymphadenopathy:    She has no  cervical adenopathy.  Neurological: She is alert and oriented to person, place, and time.  Strength 5/5 throughout. No sensory deficits.  No gait abnormality  Skin: Skin is warm and dry. No rash noted. She is not diaphoretic. No erythema. No pallor.  Psychiatric: She has a normal mood and affect. Her behavior is normal.  Nursing note and vitals reviewed.   ED Course  Procedures (including critical care time) Labs Review Labs Reviewed  BASIC METABOLIC PANEL - Abnormal; Notable for the following:    Sodium 134 (*)    All other components within normal limits  CBC - Abnormal; Notable for the following:    MCH 25.4 (*)    RDW 16.6 (*)    All other components within normal limits  URINALYSIS, ROUTINE W REFLEX MICROSCOPIC (NOT AT [facility]) - Abnormal; Notable for the following:    Color, Urine BROWN (*)    APPearance TURBID (*)    Specific Gravity, Urine  1.031 (*)    Hgb urine dipstick LARGE (*)    Protein, ur 100 (*)    Leukocytes, UA SMALL (*)    All other components within normal limits  URINE MICROSCOPIC-ADD ON - Abnormal; Notable for the following:    Squamous Epithelial / LPF 6-30 (*)    Bacteria, UA RARE (*)    All other components within normal limits  PREGNANCY, URINE  CBG MONITORING, ED    Imaging Review No results found. I have personally reviewed and evaluated these images and lab results as part of my medical decision-making.   EKG Interpretation None      MDM   Final diagnoses:  Dizziness  Menorrhagia with regular cycle    45 y.o F with a pmhx of menorrhagia induced anemia presents with dizziness after starting her menstrual cycle today. Pt has experienced this several times before during the onset of her cycle. No syncope. Pt appears well in ED, all VSS. Hemoglobin stable at 12.9. Patient given 1 L IV fluids and states that she feels significantly better after hydration. Patient ambulatory in ED and eating peanut butter. Patient requesting discharge at this  time. Recommend follow-up with her OB/GYN. Return precautions outlined in patient discharge instructions.    Dondra Spry [place], PA-C 05/02/15 [place], MD 05/03/15 [phone]

## 2015-05-01 NOTE — Discharge Instructions (Signed)
Dizziness Dizziness is a common problem. It makes you feel unsteady or lightheaded. You may feel like you are about to pass out (faint). Dizziness can lead to injury if you stumble or fall. Anyone can get dizzy, but dizziness is more common in older adults. This condition can be caused by a number of things, including:  Medicines.  Dehydration.  Illness. HOME CARE Following these instructions may help with your condition: Eating and Drinking  Drink enough fluid to keep your pee (urine) clear or pale yellow. This helps to keep you from getting dehydrated. Try to drink more clear fluids, such as water.  Do not drink alcohol.  Limit how much caffeine you drink or eat if told by your doctor.  Limit how much salt you drink or eat if told by your doctor. Activity  Avoid making quick movements.  When you stand up from sitting in a chair, steady yourself until you feel okay.  In the morning, first sit up on the side of the bed. When you feel okay, stand slowly while you hold onto something. Do this until you know that your balance is fine.  Move your legs often if you need to stand in one place for a long time. Tighten and relax your muscles in your legs while you are standing.  Do not drive or use heavy machinery if you feel dizzy.  Avoid bending down if you feel dizzy. Place items in your home so that they are easy for you to reach without leaning over. Lifestyle  Do not use any tobacco products, including cigarettes, chewing tobacco, or electronic cigarettes. If you need help quitting, ask your doctor.  Try to lower your stress level, such as with yoga or meditation. Talk with your doctor if you need help. General Instructions  Watch your dizziness for any changes.  Take medicines only as told by your doctor. Talk with your doctor if you think that your dizziness is caused by a medicine that you are taking.  Tell a friend or a family member that you are feeling dizzy. If he or  she notices any changes in your behavior, have this person call your doctor.  Keep all follow-up visits as told by your doctor. This is important. GET HELP IF:  Your dizziness does not go away.  Your dizziness or light-headedness gets worse.  You feel sick to your stomach (nauseous).  You have trouble hearing.  You have new symptoms.  You are unsteady on your feet or you feel like the room is spinning. GET HELP RIGHT AWAY IF:  You throw up (vomit) or have diarrhea and are unable to eat or drink anything.  You have trouble:  Talking.  Walking.  Swallowing.  Using your arms, hands, or legs.  You feel generally weak.  You are not thinking clearly or you have trouble forming sentences. It may take a friend or family member to notice this.  You have:  Chest pain.  Pain in your belly (abdomen).  Shortness of breath.  Sweating.  Your vision changes.  You are bleeding.  You have a headache.  You have neck pain or a stiff neck.  You have a fever.   This information is not intended to replace advice given to you by your health care provider. Make sure you discuss any questions you have with your health care provider.   Document Released: 01/31/2011 Document Revised: 06/28/2014 Document Reviewed: 02/07/2014 Elsevier Interactive Patient Education 2016 [employer]  Menorrhagia Menorrhagia is the  medical term for when your menstrual periods are heavy or last longer than usual. With menorrhagia, every period you have may cause enough blood loss and cramping that you are unable to maintain your usual activities. CAUSES  In some cases, the cause of heavy periods is unknown, but a number of conditions may cause menorrhagia. Common causes include:  A problem with the hormone-producing thyroid gland (hypothyroid).  Noncancerous growths in the uterus (polyps or fibroids).  An imbalance of the estrogen and progesterone hormones.  One of your ovaries not releasing an  egg during one or more months.  Side effects of having an intrauterine device (IUD).  Side effects of some medicines, such as anti-inflammatory medicines or blood thinners.  A bleeding disorder that stops your blood from clotting normally. SIGNS AND SYMPTOMS  During a normal period, bleeding lasts between 4 and 8 days. Signs that your periods are too heavy include:  You routinely have to change your pad or tampon every 1 or 2 hours because it is completely soaked.  You pass blood clots larger than 1 inch (2.5 cm) in size.  You have bleeding for more than 7 days.  You need to use pads and tampons at the same time because of heavy bleeding.  You need to wake up to change your pads or tampons during the night.  You have symptoms of anemia, such as tiredness, fatigue, or shortness of breath. DIAGNOSIS  Your health care provider will perform a physical exam and ask you questions about your symptoms and menstrual history. Other tests may be ordered based on what the health care provider finds during the exam. These tests can include:  Blood tests. Blood tests are used to check if you are pregnant or have hormonal changes, a bleeding or thyroid disorder, low iron levels (anemia), or other problems.  Endometrial biopsy. Your health care provider takes a sample of tissue from the inside of your uterus to be examined under a microscope.  Pelvic ultrasound. This test uses sound waves to make a picture of your uterus, ovaries, and vagina. The pictures can show if you have fibroids or other growths.  Hysteroscopy. For this test, your health care provider will use a small telescope to look inside your uterus. Based on the results of your initial tests, your health care provider may recommend further testing. TREATMENT  Treatment may not be needed. If it is needed, your health care provider may recommend treatment with one or more medicines first. If these do not reduce bleeding enough, a surgical  treatment might be an option. The best treatment for you will depend on:   Whether you need to prevent pregnancy.  Your desire to have children in the future.  The cause and severity of your bleeding.  Your opinion and personal preference.  Medicines for menorrhagia may include:  Birth control methods that use hormones. These include the pill, skin patch, vaginal ring, shots that you get every 3 months, hormonal IUD, and implant. These treatments reduce bleeding during your menstrual period.  Medicines that thicken blood and slow bleeding.  Medicines that reduce swelling, such as ibuprofen.  Medicines that contain a synthetic hormone called progestin.   Medicines that make the ovaries stop working for a short time.  You may need surgical treatment for menorrhagia if the medicines are unsuccessful. Treatment options include:  Dilation and curettage (D&C). In this procedure, your health care provider opens (dilates) your cervix and then scrapes or suctions tissue from the lining of  your uterus to reduce menstrual bleeding.  Operative hysteroscopy. This procedure uses a tiny tube with a light (hysteroscope) to view your uterine cavity and can help in the surgical removal of a polyp that may be causing heavy periods.  Endometrial ablation. Through various techniques, your health care provider permanently destroys the entire lining of your uterus (endometrium). After endometrial ablation, most women have little or no menstrual flow. Endometrial ablation reduces your ability to become pregnant.  Endometrial resection. This surgical procedure uses an electrosurgical wire loop to remove the lining of the uterus. This procedure also reduces your ability to become pregnant.  Hysterectomy. Surgical removal of the uterus and cervix is a permanent procedure that stops menstrual periods. Pregnancy is not possible after a hysterectomy. This procedure requires anesthesia and  hospitalization. HOME CARE INSTRUCTIONS   Only take over-the-counter or prescription medicines as directed by your health care provider. Take prescribed medicines exactly as directed. Do not change or switch medicines without consulting your health care provider.  Take any prescribed iron pills exactly as directed by your health care provider. Long-term heavy bleeding may result in low iron levels. Iron pills help replace the iron your body lost from heavy bleeding. Iron may cause constipation. If this becomes a problem, increase the bran, fruits, and roughage in your diet.  Do not take aspirin or medicines that contain aspirin 1 week before or during your menstrual period. Aspirin may make the bleeding worse.  If you need to change your sanitary pad or tampon more than once every 2 hours, stay in bed and rest as much as possible until the bleeding stops.  Eat well-balanced meals. Eat foods high in iron. Examples are leafy green vegetables, meat, liver, eggs, and whole grain breads and cereals. Do not try to lose weight until the abnormal bleeding has stopped and your blood iron level is back to normal. SEEK MEDICAL CARE IF:   You soak through a pad or tampon every 1 or 2 hours, and this happens every time you have a period.  You need to use pads and tampons at the same time because you are bleeding so much.  You need to change your pad or tampon during the night.  You have a period that lasts for more than 8 days.  You pass clots bigger than 1 inch wide.  You have irregular periods that happen more or less often than once a month.  You feel dizzy or faint.  You feel very weak or tired.  You feel short of breath or feel your heart is beating too fast when you exercise.  You have nausea and vomiting or diarrhea while you are taking your medicine.  You have any problems that may be related to the medicine you are taking. SEEK IMMEDIATE MEDICAL CARE IF:   You soak through 4 or more  pads or tampons in 2 hours.  You have any bleeding while you are pregnant. MAKE SURE YOU:   Understand these instructions.  Will watch your condition.  Will get help right away if you are not doing well or get worse.   This information is not intended to replace advice given to you by your health care provider. Make sure you discuss any questions you have with your health care provider.   Follow up with primary care provider if your symptoms do not improve. Encourage adequate hydration, drink plenty of fluids. Return to the ED if you experience severe worsening of your symptoms, fever, abdominal pain, loss  of consciousness.

## 2015-05-01 NOTE — ED Notes (Signed)
Discharge instructions and follow up care reviewed with patient. Patient verbalized understanding. 

## 2015-05-04 ENCOUNTER — Encounter: Payer: Self-pay | Admitting: Dietician

## 2015-05-04 ENCOUNTER — Encounter: Payer: BLUE CROSS/BLUE SHIELD | Attending: General Surgery | Admitting: Dietician

## 2015-05-04 DIAGNOSIS — Z6841 Body Mass Index (BMI) 40.0 and over, adult: Secondary | ICD-10-CM | POA: Diagnosis not present

## 2015-05-04 DIAGNOSIS — Z713 Dietary counseling and surveillance: Secondary | ICD-10-CM | POA: Insufficient documentation

## 2015-05-04 NOTE — Progress Notes (Signed)
  Follow-up visit:  6 months Post-Operative Sleeve Gastrectomy Surgery  Medical Nutrition Therapy:  Appt start time: 800 end time:  [id number] concerns today: Post-operative Bariatric Surgery Nutrition Management. Returns with a 38.5 lbs weight loss in the last 3 months. She is feeling well and excited about weight loss. Still attending [employer] program and plans to start the [employer] to read food labels and stays away from excess carbs. Has an appointment with Dr. Redmond Pulling in April.   Surgery date: 11/07/2014 Surgery type: Sleeve Gastrectomy Start weight at [facility]: 355 lbs on 07/30/14 (highest weight 365 lbs) Weight today: 251 lbs Weight change: 38.5 lbs Total weight loss: 104 lbs (114 lbs)  TANITA  BODY COMP RESULTS  10/03/14 11/22/14 12/27/14 01/31/15 05/04/15   BMI (kg/m^2) 52.4 49.1 46.3 43.4 37.6   Fat Mass (lbs) 193 184.5 165.0 148.0 117   Fat Free Mass (lbs) 157 143.5 144.0 141.5 134   Total Body Water (lbs) 115 105.0 105.5 103.5 98    Preferred Learning Style:   No preference indicated   Learning Readiness:   Ready  24-hr recall: B (AM): protein shake-Premier (30g) Snk (AM): egg white with 1/2 oz chicken and cheese (14g) L (PM): Protein shake (30g) Snk (PM): Babybel cheese or greek yogurt (6-12g) D (PM): 1.5 oz chicken breast or fish with vegetables (10g) Snk (PM):   Fluid intake: 1 protein shakes (11 oz) every other day, 51 oz water   Estimated total protein intake: ~90 g   Medications: see list Supplementation: taking  CBG monitoring: every 3 days Average CBG per patient: 87-92 mg/dl Last patient reported A1c: ~6.1% in the hospital   Using straws: No Drinking while eating: No Hair loss: No Carbonated beverages: No N/V/D/C: No Dumping syndrome: No  Recent physical activity:  BELtT program 3 x week, Walking 7 x week for 45-60 minutes (split up times)  Progress Towards Goal(s):  In progress.  Handouts given during visit include:  none   Nutritional  Diagnosis:  -3.3 Overweight/obesity related to past poor dietary habits and physical inactivity as evidenced by patient w/ recent sleeve gastrectomy surgery following dietary guidelines for continued weight loss.    Intervention:  Nutrition education/diet reinforcement. Goals:  Follow Phase 3B: High Protein + Non-Starchy Vegetables  Eat 3-6 small meals/snacks, every 3-5 hrs  Increase lean protein foods to meet 60g goal  Once you can eat 30 grams of protein in a day, have just one protein shake per day  Increase fluid intake to 64oz + (on e more bottle of water)  Avoid drinking 15 minutes before, during and 30 minutes after eating  Aim for >30 min of physical activity daily  Eat protein first, then vegetables, then a little bit of starch or fruit  Teaching Method Utilized:  Visual Auditory Hands on  Barriers to learning/adherence to lifestyle change: none  Demonstrated degree of understanding via:  Teach Back   Monitoring/Evaluation:  Dietary intake, exercise, and body weight. Follow up in 3 months for 9 month post-op visit.

## 2015-05-04 NOTE — Patient Instructions (Addendum)
Goals:  Follow Phase 3B: High Protein + Non-Starchy Vegetables  Eat 3-6 small meals/snacks, every 3-5 hrs  Increase lean protein foods to meet 60g goal  Once you can eat 30 grams of protein in a day, have just one protein shake per day  Increase fluid intake to 64oz + (on e more bottle of water)  Avoid drinking 15 minutes before, during and 30 minutes after eating  Aim for >30 min of physical activity daily  Eat protein first, then vegetables, then a little bit of starch or fruit Surgery date: 11/07/2014 Surgery type: Sleeve Gastrectomy Start weight at [facility]: 355 lbs on 07/30/14 (highest weight 365 lbs) Weight today: 251 lbs Weight change: 38.5 lbs Total weight loss: 104 lbs (114 lbs)  TANITA  BODY COMP RESULTS  10/03/14 11/22/14 12/27/14 01/31/15 05/04/15   BMI (kg/m^2) 52.4 49.1 46.3 43.4 37.6   Fat Mass (lbs) 193 184.5 165.0 148.0 117   Fat Free Mass (lbs) 157 143.5 144.0 141.5 134   Total Body Water (lbs) 115 105.0 105.5 103.5 98

## 2015-06-05 ENCOUNTER — Other Ambulatory Visit: Payer: Self-pay | Admitting: Hematology and Oncology

## 2015-06-05 ENCOUNTER — Telehealth: Payer: Self-pay | Admitting: *Deleted

## 2015-06-05 ENCOUNTER — Other Ambulatory Visit (HOSPITAL_BASED_OUTPATIENT_CLINIC_OR_DEPARTMENT_OTHER): Payer: BLUE CROSS/BLUE SHIELD

## 2015-06-05 DIAGNOSIS — D500 Iron deficiency anemia secondary to blood loss (chronic): Secondary | ICD-10-CM

## 2015-06-05 LAB — CBC & DIFF AND RETIC
BASO%: 0.4 % (ref 0.0–2.0)
BASOS ABS: 0 10*3/uL (ref 0.0–0.1)
EOS%: 3 % (ref 0.0–7.0)
Eosinophils Absolute: 0.2 10*3/uL (ref 0.0–0.5)
HEMATOCRIT: 41.2 % (ref 34.8–46.6)
HGB: 13 g/dL (ref 11.6–15.9)
Immature Retic Fract: 7.2 % (ref 1.60–10.00)
LYMPH#: 1.8 10*3/uL (ref 0.9–3.3)
LYMPH%: 35.6 % (ref 14.0–49.7)
MCH: 25 pg — ABNORMAL LOW (ref 25.1–34.0)
MCHC: 31.6 g/dL (ref 31.5–36.0)
MCV: 79.4 fL — AB (ref 79.5–101.0)
MONO#: 0.5 10*3/uL (ref 0.1–0.9)
MONO%: 10.5 % (ref 0.0–14.0)
NEUT#: 2.5 10*3/uL (ref 1.5–6.5)
NEUT%: 50.5 % (ref 38.4–76.8)
PLATELETS: 272 10*3/uL (ref 145–400)
RBC: 5.19 10*6/uL (ref 3.70–5.45)
RDW: 16.1 % — ABNORMAL HIGH (ref 11.2–14.5)
RETIC CT ABS: 48.27 10*3/uL (ref 33.70–90.70)
Retic %: 0.93 % (ref 0.70–2.10)
WBC: 5 10*3/uL (ref 3.9–10.3)

## 2015-06-05 LAB — FERRITIN: Ferritin: 41 ng/ml (ref 9–269)

## 2015-06-05 NOTE — Telephone Encounter (Signed)
-----   Message from Heath Lark, MD sent at 06/05/2015 10:34 AM EDT ----- Regarding: reschedule appt Pls let her know labs are good No need to come back next week I will put new POF to recheck in 2 months

## 2015-06-05 NOTE — Telephone Encounter (Signed)
Pt notified of message below.

## 2015-06-12 ENCOUNTER — Ambulatory Visit: Payer: BLUE CROSS/BLUE SHIELD | Admitting: Hematology and Oncology

## 2015-06-12 ENCOUNTER — Ambulatory Visit: Payer: BLUE CROSS/BLUE SHIELD

## 2015-06-15 DIAGNOSIS — R7303 Prediabetes: Secondary | ICD-10-CM | POA: Diagnosis not present

## 2015-06-15 DIAGNOSIS — Z9884 Bariatric surgery status: Secondary | ICD-10-CM | POA: Diagnosis not present

## 2015-06-15 DIAGNOSIS — M47817 Spondylosis without myelopathy or radiculopathy, lumbosacral region: Secondary | ICD-10-CM | POA: Diagnosis not present

## 2015-06-15 DIAGNOSIS — E669 Obesity, unspecified: Secondary | ICD-10-CM | POA: Diagnosis not present

## 2015-06-19 ENCOUNTER — Ambulatory Visit: Payer: BLUE CROSS/BLUE SHIELD

## 2015-06-24 DIAGNOSIS — F509 Eating disorder, unspecified: Secondary | ICD-10-CM | POA: Diagnosis not present

## 2015-06-29 DIAGNOSIS — N920 Excessive and frequent menstruation with regular cycle: Secondary | ICD-10-CM | POA: Diagnosis not present

## 2015-06-29 DIAGNOSIS — R110 Nausea: Secondary | ICD-10-CM | POA: Diagnosis not present

## 2015-07-17 DIAGNOSIS — W57XXXA Bitten or stung by nonvenomous insect and other nonvenomous arthropods, initial encounter: Secondary | ICD-10-CM | POA: Diagnosis not present

## 2015-07-17 DIAGNOSIS — J309 Allergic rhinitis, unspecified: Secondary | ICD-10-CM | POA: Diagnosis not present

## 2015-07-31 ENCOUNTER — Other Ambulatory Visit (HOSPITAL_BASED_OUTPATIENT_CLINIC_OR_DEPARTMENT_OTHER): Payer: BLUE CROSS/BLUE SHIELD

## 2015-07-31 ENCOUNTER — Telehealth: Payer: Self-pay | Admitting: Hematology and Oncology

## 2015-07-31 ENCOUNTER — Telehealth: Payer: Self-pay | Admitting: *Deleted

## 2015-07-31 ENCOUNTER — Other Ambulatory Visit: Payer: Self-pay | Admitting: Hematology and Oncology

## 2015-07-31 DIAGNOSIS — D500 Iron deficiency anemia secondary to blood loss (chronic): Secondary | ICD-10-CM

## 2015-07-31 LAB — CBC & DIFF AND RETIC
BASO%: 0.6 % (ref 0.0–2.0)
BASOS ABS: 0 10*3/uL (ref 0.0–0.1)
EOS ABS: 0.6 10*3/uL — AB (ref 0.0–0.5)
EOS%: 11.5 % — AB (ref 0.0–7.0)
HCT: 38.9 % (ref 34.8–46.6)
HEMOGLOBIN: 12.1 g/dL (ref 11.6–15.9)
Immature Retic Fract: 13 % — ABNORMAL HIGH (ref 1.60–10.00)
LYMPH#: 2.1 10*3/uL (ref 0.9–3.3)
LYMPH%: 43 % (ref 14.0–49.7)
MCH: 24.6 pg — AB (ref 25.1–34.0)
MCHC: 31.1 g/dL — ABNORMAL LOW (ref 31.5–36.0)
MCV: 79.1 fL — AB (ref 79.5–101.0)
MONO#: 0.5 10*3/uL (ref 0.1–0.9)
MONO%: 10.3 % (ref 0.0–14.0)
NEUT#: 1.7 10*3/uL (ref 1.5–6.5)
NEUT%: 34.6 % — ABNORMAL LOW (ref 38.4–76.8)
NRBC: 0 % (ref 0–0)
PLATELETS: 257 10*3/uL (ref 145–400)
RBC: 4.92 10*6/uL (ref 3.70–5.45)
RDW: 15.4 % — AB (ref 11.2–14.5)
Retic %: 1.07 % (ref 0.70–2.10)
Retic Ct Abs: 52.64 10*3/uL (ref 33.70–90.70)
WBC: 5 10*3/uL (ref 3.9–10.3)

## 2015-07-31 LAB — FERRITIN: Ferritin: 24 ng/ml (ref 9–269)

## 2015-07-31 NOTE — Telephone Encounter (Signed)
s.w. pt and advised on Aug appt....pt ok and aware °

## 2015-07-31 NOTE — Telephone Encounter (Signed)
-----   Message from Heath Lark, MD sent at 07/31/2015 11:11 AM EDT ----- Regarding: labs She is not anemic I recommend changing appt to 2 months If OK with her, let me know and I will place order/pOF ----- Message -----    From: [facility] Interface    Sent: 07/31/2015   8:33 AM      To: Heath Lark, MD

## 2015-07-31 NOTE — Telephone Encounter (Signed)
Informed pt of Dr. Calton Dach message.  Pt is ok w/ coming back in 2 months.  Informed pt to expect call from [place]. She verbalized understanding.

## 2015-08-02 ENCOUNTER — Encounter: Payer: Self-pay | Admitting: Dietician

## 2015-08-02 ENCOUNTER — Encounter: Payer: BLUE CROSS/BLUE SHIELD | Attending: General Surgery | Admitting: Dietician

## 2015-08-02 DIAGNOSIS — Z6841 Body Mass Index (BMI) 40.0 and over, adult: Secondary | ICD-10-CM | POA: Insufficient documentation

## 2015-08-02 DIAGNOSIS — Z713 Dietary counseling and surveillance: Secondary | ICD-10-CM | POA: Diagnosis not present

## 2015-08-02 NOTE — Progress Notes (Signed)
  Follow-up visit:  9 months Post-Operative Sleeve Gastrectomy Surgery  Medical Nutrition Therapy:  Appt start time: [id number] end time:  850  Primary concerns today: Post-operative Bariatric Surgery Nutrition Management. Returns with a 30-lbs weight loss. States that she was "bad" on Sunday and ate part of a banana. Sensitive to sweet foods. Concerned about fruit as she has a history of diabetes. Working on meal planning. Has walked/run a few 5ks. Plans to go whitewater rafting and ziplining in July. Still sees Dr. Ardath Sax every 3 months.   Nonscale victories: sitting on husbands lap, playing with grandbabies, climbing lighthouse, walking/running 3 miles  Surgery date: 11/07/2014 Surgery type: Sleeve Gastrectomy Start weight at [facility] 355 lbs on 07/30/14 (highest weight 365 lbs) Weight today: 220.8 lbs Weight change: 30 lbs Total weight loss: 134 lbs (144 lbs) Weight loss goal: 175-190 lbs  TANITA  BODY COMP RESULTS  10/03/14 11/22/14 12/27/14 01/31/15 05/04/15 08/02/15   BMI (kg/m^2) 52.4 49.1 46.3 43.4 37.6 33.1   Fat Mass (lbs) 193 184.5 165.0 148.0 117 89.8   Fat Free Mass (lbs) 157 143.5 144.0 141.5 134 131   Total Body Water (lbs) 115 105.0 105.5 103.5 98 94.8    Preferred Learning Style:   No preference indicated   Learning Readiness:   Ready  24-hr recall: B (AM): protein shake-Premier OR egg whites with 1 oz chicken and cheese (14-30g) Snk (AM): Low fat [place] yogurt (12g) L (PM): 4 oz fish or chicken with vegetables (28g) Snk (PM): apple slices and PB2 (5g) D (PM): 2 oz chicken breast and vegetables (14g) Snk (PM):   Fluid intake: 1 protein shake (11 oz) every other day, 68 oz water   Estimated total protein intake: ~90 g   Medications: see list Supplementation: taking  CBG monitoring: every 3 days Average CBG per patient: 87-92 mg/dl Last patient reported A1c: "5 something" per patient  Using straws: No Drinking while eating: No Hair loss: No Carbonated beverages:  No N/V/D/C: constipation, takes Miralax or Milk of Magnesia prn Dumping syndrome: No  Recent physical activity:  [employer] program 3 x week, 30 minutes cardio + 30 minutes strength training; 2 days of walking and running; walking on lunch breaks almost daily  Progress Towards Goal(s):  In progress.  Handouts given during visit include:  none   Nutritional Diagnosis:  [place]-3.3 Overweight/obesity related to past poor dietary habits and physical inactivity as evidenced by patient w/ recent sleeve gastrectomy surgery following dietary guidelines for continued weight loss.    Intervention:  Nutrition education/diet reinforcement. Goals:  Follow Phase 3B: High Protein + Non-Starchy Vegetables  Eat 3-6 small meals/snacks, every 3-5 hrs  Increase lean protein foods to meet 60g goal  Once you can eat 30 grams of protein in a day, have just one protein shake per day  Increase fluid intake to 64oz +   Avoid drinking 15 minutes before, during and 30 minutes after eating  Aim for >30 min of physical activity daily  Eat protein first, then vegetables, then a little bit of starch or fruit  Teaching Method Utilized:  Visual Auditory Hands on  Barriers to learning/adherence to lifestyle change: none  Demonstrated degree of understanding via:  Teach Back   Monitoring/Evaluation:  Dietary intake, exercise, and body weight. Follow up in 3 months for 12 month post-op visit.

## 2015-08-02 NOTE — Patient Instructions (Addendum)
Goals:  Follow Phase 3B: High Protein + Non-Starchy Vegetables  Eat 3-6 small meals/snacks, every 3-5 hrs  Increase lean protein foods to meet 60g goal  Once you can eat 30 grams of protein in a day, have just one protein shake per day  Increase fluid intake to 64oz +  Avoid drinking 15 minutes before, during and 30 minutes after eating  Aim for >30 min of physical activity daily  Eat protein first, then vegetables, then a little bit of starch or fruit  Surgery date: 11/07/2014 Surgery type: Sleeve Gastrectomy Start weight at [facility]: 355 lbs on 07/30/14 (highest weight 365 lbs) Weight today: 220.8 lbs Weight change: 30 lbs Total weight loss: 134 lbs (144 lbs) Weight loss goal: 175-190 lbs  TANITA  BODY COMP RESULTS  10/03/14 11/22/14 12/27/14 01/31/15 05/04/15 08/02/15   BMI (kg/m^2) 52.4 49.1 46.3 43.4 37.6 33.1   Fat Mass (lbs) 193 184.5 165.0 148.0 117 89.8   Fat Free Mass (lbs) 157 143.5 144.0 141.5 134 131   Total Body Water (lbs) 115 105.0 105.5 103.5 98 94.8

## 2015-08-07 ENCOUNTER — Ambulatory Visit: Payer: BLUE CROSS/BLUE SHIELD | Admitting: Hematology and Oncology

## 2015-09-12 DIAGNOSIS — J309 Allergic rhinitis, unspecified: Secondary | ICD-10-CM | POA: Diagnosis not present

## 2015-09-28 ENCOUNTER — Encounter: Payer: BLUE CROSS/BLUE SHIELD | Attending: General Surgery | Admitting: Dietician

## 2015-09-28 ENCOUNTER — Telehealth: Payer: Self-pay | Admitting: *Deleted

## 2015-09-28 ENCOUNTER — Other Ambulatory Visit (HOSPITAL_BASED_OUTPATIENT_CLINIC_OR_DEPARTMENT_OTHER): Payer: BLUE CROSS/BLUE SHIELD

## 2015-09-28 ENCOUNTER — Other Ambulatory Visit: Payer: Self-pay | Admitting: Hematology and Oncology

## 2015-09-28 DIAGNOSIS — D500 Iron deficiency anemia secondary to blood loss (chronic): Secondary | ICD-10-CM

## 2015-09-28 DIAGNOSIS — Z713 Dietary counseling and surveillance: Secondary | ICD-10-CM | POA: Insufficient documentation

## 2015-09-28 DIAGNOSIS — Z6841 Body Mass Index (BMI) 40.0 and over, adult: Secondary | ICD-10-CM | POA: Diagnosis not present

## 2015-09-28 LAB — CBC & DIFF AND RETIC
BASO%: 0.8 % (ref 0.0–2.0)
BASOS ABS: 0 10*3/uL (ref 0.0–0.1)
EOS ABS: 0.1 10*3/uL (ref 0.0–0.5)
EOS%: 3.5 % (ref 0.0–7.0)
HEMATOCRIT: 36.5 % (ref 34.8–46.6)
HEMOGLOBIN: 11.4 g/dL — AB (ref 11.6–15.9)
Immature Retic Fract: 17.3 % — ABNORMAL HIGH (ref 1.60–10.00)
LYMPH#: 1.8 10*3/uL (ref 0.9–3.3)
LYMPH%: 45.5 % (ref 14.0–49.7)
MCH: 24.3 pg — ABNORMAL LOW (ref 25.1–34.0)
MCHC: 31.2 g/dL — AB (ref 31.5–36.0)
MCV: 77.7 fL — ABNORMAL LOW (ref 79.5–101.0)
MONO#: 0.4 10*3/uL (ref 0.1–0.9)
MONO%: 11.1 % (ref 0.0–14.0)
NEUT%: 39.1 % (ref 38.4–76.8)
NEUTROS ABS: 1.6 10*3/uL (ref 1.5–6.5)
Platelets: 269 10*3/uL (ref 145–400)
RBC: 4.7 10*6/uL (ref 3.70–5.45)
RDW: 15.6 % — AB (ref 11.2–14.5)
RETIC %: 1.52 % (ref 0.70–2.10)
RETIC CT ABS: 71.44 10*3/uL (ref 33.70–90.70)
WBC: 4 10*3/uL (ref 3.9–10.3)

## 2015-09-28 LAB — FERRITIN: FERRITIN: 12 ng/mL (ref 9–269)

## 2015-09-28 NOTE — Telephone Encounter (Signed)
Spoke with patient. States she is feeling bad and will be here for appts.

## 2015-09-28 NOTE — Patient Instructions (Addendum)
Goals:  Follow Phase 3B: High Protein + Non-Starchy Vegetables  Eat 3-6 small meals/snacks, every 3-5 hrs  Increase lean protein foods to meet 60g goal  Once you can eat 30 grams of protein in a day, have just one protein shake per day  Increase fluid intake to 64oz +  Avoid drinking 15 minutes before, during and 30 minutes after eating  Aim for >30 min of physical activity daily  Eat protein first, then vegetables, then a little bit of starch or fruit  Try taking Bariatric Advantage Iron Chewy Bite Chocolate Raspberry 30 mg with multivitamin https://[email or web address].bariatricadvantage.com/catalog/group/24633/  Try Powerade Zero to help with fluid  Surgery date: 11/07/2014 Surgery type: Sleeve Gastrectomy Start weight at [facility]: 355 lbs on 07/30/14 (highest weight 365 lbs) Weight today: 210.2 lbs  Weight change: 10.6 lbs (16.4 lbs fat mass loss) Total weight loss: 144 lbs (154 lbs) Weight loss goal: 175-190 lbs  TANITA  BODY COMP RESULTS  10/03/14 11/22/14 12/27/14 01/31/15 05/04/15 08/02/15 09/28/15   BMI (kg/m^2) 52.4 49.1 46.3 43.4 37.6 33.1 31.5   Fat Mass (lbs) 193 184.5 165.0 148.0 117 89.8 73.4   Fat Free Mass (lbs) 157 143.5 144.0 141.5 134 131 136.8   Total Body Water (lbs) 115 105.0 105.5 103.5 98 94.8 98.4

## 2015-09-28 NOTE — Telephone Encounter (Signed)
-----   Message from Heath Lark, MD sent at 09/28/2015  9:38 AM EDT ----- Regarding: next week Pls remind her she will need IV iron next week I will see her then ----- Message ----- From: Interface, [facility] Sent: 09/28/2015   8:13 AM To: Heath Lark, MD

## 2015-09-28 NOTE — Progress Notes (Signed)
Follow-up visit:  11 months Post-Operative Sleeve Gastrectomy Surgery  Medical Nutrition Therapy:  Appt start time: 500 end time:  [id number]  Primary concerns today: Post-operative Bariatric Surgery Nutrition Management. Returns with a 10.6 lbs weight loss. Iron was low again today and had to get another iron infusion. Feeling really sick from low iron.   Feels like things are "hay wire". Can eat a good amount of food and craving sweet and salty foods. Feels like she's been your snacking a lot. Having peanuts regularly and was having sugar free cookies. Stopped buying the cookies 2 weeks ago.   Joined Weight Watchers since it was free through work for the accountability.   Works out with the [employer] 3 x week.  Surgery date: 11/07/2014 Surgery type: Sleeve Gastrectomy Start weight at [facility]: 355 lbs on 07/30/14 (highest weight 365 lbs) Weight today: 210.2 lbs  Weight change: 10.6 lbs (16.4 lbs fat mass loss) Total weight loss: 144 lbs (154 lbs) Weight loss goal: 170-180 lbs  TANITA  BODY COMP RESULTS  10/03/14 11/22/14 12/27/14 01/31/15 05/04/15 08/02/15 09/28/15   BMI (kg/m^2) 52.4 49.1 46.3 43.4 37.6 33.1 31.5   Fat Mass (lbs) 193 184.5 165.0 148.0 117 89.8 73.4   Fat Free Mass (lbs) 157 143.5 144.0 141.5 134 131 136.8   Total Body Water (lbs) 115 105.0 105.5 103.5 98 94.8 98.4    Preferred Learning Style:   No preference indicated   Learning Readiness:   Ready  24-hr recall: B (AM): protein shake-Premier  (30g) Snk (AM): none or egg beater with cheese and salsa and avocado or fruit/carrots (0-12g) L (PM): 2-4 oz fish or chicken with vegetables (14-28g) Snk (PM): carrots or apple slices and PB2 or peanuts or half protein bar (5g) D (PM): 2-4 oz chicken breast and vegetables (14-28g) Snk (PM):   Fluid intake: 1 protein shake (11 oz) every day, 51 oz water   Estimated total protein intake: ~90 g   Medications: see list Supplementation: taking - [employer] multivitamin 2 x day, calcium 2  x day  CBG monitoring: none Average CBG per patient: N/A Last patient reported A1c: "5 something" per patient  Using straws: No Drinking while eating: No Hair loss: No Carbonated beverages: No N/V/D/C: nausea from anemia, constipation takes Milk of Magnesia (hasn't had to take it in a while) Dumping syndrome: sick when had some cake   Recent physical activity:  [employer] program 3 x week, 30 minutes cardio + 30 minutes strength training; 2 days of walking and running; walking on lunch breaks almost daily  Progress Towards Goal(s):  In progress.  Handouts given during visit include:  none   Nutritional Diagnosis:  [place]-3.3 Overweight/obesity related to past poor dietary habits and physical inactivity as evidenced by patient w/ recent sleeve gastrectomy surgery following dietary guidelines for continued weight loss.    Intervention:  Nutrition education/diet reinforcement. Goals:  Follow Phase 3B: High Protein + Non-Starchy Vegetables  Eat 3-6 small meals/snacks, every 3-5 hrs  Increase lean protein foods to meet 60g goal  Once you can eat 30 grams of protein in a day, have just one protein shake per day  Increase fluid intake to 64oz +  Avoid drinking 15 minutes before, during and 30 minutes after eating  Aim for >30 min of physical activity daily  Eat protein first, then vegetables, then a little bit of starch or fruit  Try taking Bariatric Advantage Iron Chewy Bite Chocolate Raspberry 30 mg with multivitamin https://[email or web address].bariatricadvantage.com/catalog/group/24633/  Try Powerade  Zero to help with fluid  Teaching Method Utilized:  Visual Auditory Hands on  Barriers to learning/adherence to lifestyle change: none  Demonstrated degree of understanding via:  Teach Back   Monitoring/Evaluation:  Dietary intake, exercise, and body weight. Follow up in 1 months for 12 month post-op visit.

## 2015-10-05 ENCOUNTER — Ambulatory Visit (HOSPITAL_BASED_OUTPATIENT_CLINIC_OR_DEPARTMENT_OTHER): Payer: BLUE CROSS/BLUE SHIELD

## 2015-10-05 ENCOUNTER — Telehealth: Payer: Self-pay | Admitting: Hematology and Oncology

## 2015-10-05 ENCOUNTER — Encounter: Payer: Self-pay | Admitting: Hematology and Oncology

## 2015-10-05 ENCOUNTER — Ambulatory Visit (HOSPITAL_BASED_OUTPATIENT_CLINIC_OR_DEPARTMENT_OTHER): Payer: BLUE CROSS/BLUE SHIELD | Admitting: Hematology and Oncology

## 2015-10-05 VITALS — BP 129/62 | HR 65 | Temp 98.4°F | Resp 18 | Wt 213.0 lb

## 2015-10-05 DIAGNOSIS — Z9884 Bariatric surgery status: Secondary | ICD-10-CM

## 2015-10-05 DIAGNOSIS — D500 Iron deficiency anemia secondary to blood loss (chronic): Secondary | ICD-10-CM

## 2015-10-05 MED ORDER — SODIUM CHLORIDE 0.9 % IV SOLN
510.0000 mg | Freq: Once | INTRAVENOUS | Status: AC
  Administered 2015-10-05: 510 mg via INTRAVENOUS
  Filled 2015-10-05: qty 17

## 2015-10-05 MED ORDER — SODIUM CHLORIDE 0.9 % IV SOLN
Freq: Once | INTRAVENOUS | Status: AC
  Administered 2015-10-05: 12:00:00 via INTRAVENOUS

## 2015-10-05 NOTE — Assessment & Plan Note (Signed)
The most likely cause of her anemia is due to chronic blood loss/malabsorption syndrome. We discussed some of the risks, benefits, and alternatives of intravenous iron infusions. The patient is symptomatic from anemia and the iron level is critically low. She tolerated oral iron supplement poorly and desires to achieved higher levels of iron faster for adequate hematopoesis. Some of the side-effects to be expected including risks of infusion reactions, phlebitis, headaches, nausea and fatigue.  The patient is willing to proceed. Patient education material was dispensed.  Goal is to keep ferritin level greater than 50  

## 2015-10-05 NOTE — Progress Notes (Signed)
[place] OFFICE PROGRESS NOTE  Cynthia Coma, MD SUMMARY OF HEMATOLOGIC HISTORY:  She was found to have abnormal CBC from routine blood work. In November 2008, she has anemia with hemoglobin of 9.7, MCV of 58.4 with platelet count elevation at 469. On 07/13/2013, she is still anemic with hemoglobin 9.1, MCV of 60.7 and elevated platelet count of 434. Iron studies performed in her physician's office was very low. She denies recent chest pain on exertion, shortness of breath on minimal exertion, or palpitations. She did complain of profound weakness, dizziness as well as new onset of leg cramps. She had not noticed any recent bleeding such as epistaxis, hematuria or hematochezia. She does complain of excessive menstruation. She has periods 7-10 days every month with significant heavy periods in the first few days of each menstrual cycle. On 07/15/2013 and 09/16/2013, she received intravenous iron. On 05/09/2014-05/16/2014, she received IV Feraheme On 11/07/2014, she underwent laparoscopic sleeve gastrectomy with hiatal hernia repair. She received IV iron in October 2016 follow-up.. INTERVAL HISTORY: Cynthia Vazquez 45 y.o. female returns for follow-up She looks wonderful and has lost tremendous amount of weight. She started exercising again and got off a lot of medications that she took in the past for type 2 diabetes and hypertension she continues to have regular menstruation with heavy menstrual cycle The patient denies any recent signs or symptoms of bleeding such as spontaneous epistaxis, hematuria or hematochezia.  I have reviewed the past medical history, past surgical history, social history and family history with the patient and they are unchanged from previous note.  ALLERGIES:  is allergic to shellfish allergy and augmentin [amoxicillin-pot clavulanate].  MEDICATIONS:  Current Outpatient Prescriptions  Medication Sig Dispense Refill  . Calcium Carb-Cholecalciferol  (CALCIUM 600 + D PO) Take 1 tablet by mouth 3 (three) times daily.    . Cyanocobalamin (B-12) 3000 MCG CAPS Take 1 capsule by mouth once a week. Wednesday    . fluocinonide (LIDEX) 0.05 % external solution Apply 1 application topically 2 (two) times daily as needed (for itchy scalp).   0  . fluticasone (FLONASE) 50 MCG/ACT nasal spray Place 2 sprays into both nostrils daily. (Patient taking differently: Place 2 sprays into both nostrils daily as needed. ) 16 g 0  . ibuprofen (ADVIL,MOTRIN) 600 MG tablet Take 1 tablet (600 mg total) by mouth every 6 (six) hours as needed. 30 tablet 0  . levothyroxine (SYNTHROID, LEVOTHROID) 50 MCG tablet Take 50 mcg by mouth daily before breakfast.    . loratadine (CLARITIN) 10 MG tablet Take 10 mg by mouth daily as needed for allergies.     . Multiple Vitamins-Minerals (OPTISOURCE POST BARIATRIC SURG) CHEW Chew 2 tablets by mouth 2 (two) times daily.    Marland Kitchen NEXIUM 40 MG capsule Take 40 mg by mouth daily as needed (indigestion.).   0  . oxyCODONE (ROXICODONE) 5 MG/5ML solution Take 5-10 mg by mouth every 4 (four) hours as needed for moderate pain or severe pain.    . tranexamic acid (LYSTEDA) 650 MG TABS tablet Take 1,950 mg by mouth 3 (three) times daily. x5 days during menstrual cycle.  10   No current facility-administered medications for this visit.      REVIEW OF SYSTEMS:   Constitutional: Denies fevers, chills or night sweats Eyes: Denies blurriness of vision Ears, nose, mouth, throat, and face: Denies mucositis or sore throat Respiratory: Denies cough, dyspnea or wheezes Cardiovascular: Denies palpitation, chest discomfort or lower extremity swelling Gastrointestinal:  Denies  nausea, heartburn or change in bowel habits Skin: Denies abnormal skin rashes Lymphatics: Denies new lymphadenopathy or easy bruising Neurological:Denies numbness, tingling or new weaknesses Behavioral/Psych: Mood is stable, no new changes  All other systems were reviewed with the  patient and are negative.  PHYSICAL EXAMINATION: ECOG PERFORMANCE STATUS: 0 - Asymptomatic  Vitals:   10/05/15 1109  BP: 129/62  Pulse: 65  Resp: 18  Temp: 98.4 F (36.9 C)   Filed Weights   10/05/15 1109  Weight: 213 lb (96.6 kg)    GENERAL:alert, no distress and comfortable SKIN: skin color, texture, turgor are normal, no rashes or significant lesions EYES: normal, Conjunctiva are pink and non-injected, sclera clear Musculoskeletal:no cyanosis of digits and no clubbing  NEURO: alert & oriented x 3 with fluent speech, no focal motor/sensory deficits  LABORATORY DATA:  I have reviewed the data as listed     Component Value Date/Time   NA 134 (L) 05/01/2015 1452   K 3.6 05/01/2015 1452   CL 103 05/01/2015 1452   CO2 23 05/01/2015 1452   GLUCOSE 94 05/01/2015 1452   BUN 14 05/01/2015 1452   CREATININE 0.64 05/01/2015 1452   CALCIUM 9.1 05/01/2015 1452   PROT 7.3 11/08/2014 0555   ALBUMIN 3.5 11/08/2014 0555   AST 74 (H) 11/08/2014 0555   ALT 57 (H) 11/08/2014 0555   ALKPHOS 74 11/08/2014 0555   BILITOT 0.4 11/08/2014 0555   GFRNONAA >60 05/01/2015 1452   GFRAA >60 05/01/2015 1452    No results found for: SPEP, UPEP  Lab Results  Component Value Date   WBC 4.0 09/28/2015   NEUTROABS 1.6 09/28/2015   HGB 11.4 (L) 09/28/2015   HCT 36.5 09/28/2015   MCV 77.7 (L) 09/28/2015   PLT 269 09/28/2015      Chemistry      Component Value Date/Time   NA 134 (L) 05/01/2015 1452   K 3.6 05/01/2015 1452   CL 103 05/01/2015 1452   CO2 23 05/01/2015 1452   BUN 14 05/01/2015 1452   CREATININE 0.64 05/01/2015 1452      Component Value Date/Time   CALCIUM 9.1 05/01/2015 1452   ALKPHOS 74 11/08/2014 0555   AST 74 (H) 11/08/2014 0555   ALT 57 (H) 11/08/2014 0555   BILITOT 0.4 11/08/2014 0555      ASSESSMENT & PLAN:  Iron deficiency anemia due to chronic blood loss The most likely cause of her anemia is due to chronic blood loss/malabsorption syndrome. We  discussed some of the risks, benefits, and alternatives of intravenous iron infusions. The patient is symptomatic from anemia and the iron level is critically low. She tolerated oral iron supplement poorly and desires to achieved higher levels of iron faster for adequate hematopoesis. Some of the side-effects to be expected including risks of infusion reactions, phlebitis, headaches, nausea and fatigue.  The patient is willing to proceed. Patient education material was dispensed.  Goal is to keep ferritin level greater than 50   S/P laparoscopic sleeve gastrectomy The patient is at risk for multiple mineral deficiency secondary to bariatric surgery. I recommend high-dose vitamin D replacement therapy along with oral vitamin B-12 supplement   All questions were answered. The patient knows to call the clinic with any problems, questions or concerns. No barriers to learning was detected.  I spent 15 minutes counseling the patient face to face. The total time spent in the appointment was 20 minutes and more than 50% was on counseling.     [place],  Lucianna Ostlund, MD 8/10/20171:21 PM

## 2015-10-05 NOTE — Telephone Encounter (Signed)
Gave patient avs report and appointments for August 2017 and June 2018.

## 2015-10-05 NOTE — Assessment & Plan Note (Signed)
The patient is at risk for multiple mineral deficiency secondary to bariatric surgery. I recommend high-dose vitamin D replacement therapy along with oral vitamin B-12 supplement 

## 2015-10-05 NOTE — Patient Instructions (Signed)

## 2015-10-13 ENCOUNTER — Ambulatory Visit (HOSPITAL_BASED_OUTPATIENT_CLINIC_OR_DEPARTMENT_OTHER): Payer: BLUE CROSS/BLUE SHIELD

## 2015-10-13 VITALS — BP 116/70 | HR 58 | Temp 98.9°F | Resp 17

## 2015-10-13 DIAGNOSIS — D500 Iron deficiency anemia secondary to blood loss (chronic): Secondary | ICD-10-CM

## 2015-10-13 MED ORDER — SODIUM CHLORIDE 0.9 % IV SOLN
Freq: Once | INTRAVENOUS | Status: AC
  Administered 2015-10-13: 08:00:00 via INTRAVENOUS

## 2015-10-13 MED ORDER — SODIUM CHLORIDE 0.9 % IV SOLN
510.0000 mg | Freq: Once | INTRAVENOUS | Status: AC
  Administered 2015-10-13: 510 mg via INTRAVENOUS
  Filled 2015-10-13: qty 17

## 2015-10-13 NOTE — Patient Instructions (Signed)
Ferumoxytol injection What is this medicine? FERUMOXYTOL is an iron complex. Iron is used to make healthy red blood cells, which carry oxygen and nutrients throughout the body. This medicine is used to treat iron deficiency anemia in people with chronic kidney disease. This medicine may be used for other purposes; ask your health care provider or pharmacist if you have questions. What should I tell my health care provider before I take this medicine? They need to know if you have any of these conditions: -anemia not caused by low iron levels -high levels of iron in the blood -magnetic resonance imaging (MRI) test scheduled -an unusual or allergic reaction to iron, other medicines, foods, dyes, or preservatives -pregnant or trying to get pregnant -breast-feeding How should I use this medicine? This medicine is for injection into a vein. It is given by a health care professional in a hospital or clinic setting. Talk to your pediatrician regarding the use of this medicine in children. Special care may be needed. Overdosage: If you think you have taken too much of this medicine contact a poison control center or emergency room at once. NOTE: This medicine is only for you. Do not share this medicine with others. What if I miss a dose? It is important not to miss your dose. Call your doctor or health care professional if you are unable to keep an appointment. What may interact with this medicine? This medicine may interact with the following medications: -other iron products This list may not describe all possible interactions. Give your health care provider a list of all the medicines, herbs, non-prescription drugs, or dietary supplements you use. Also tell them if you smoke, drink alcohol, or use illegal drugs. Some items may interact with your medicine. What should I watch for while using this medicine? Visit your doctor or healthcare professional regularly. Tell your doctor or healthcare  professional if your symptoms do not start to get better or if they get worse. You may need blood work done while you are taking this medicine. You may need to follow a special diet. Talk to your doctor. Foods that contain iron include: whole grains/cereals, dried fruits, beans, or peas, leafy green vegetables, and organ meats (liver, kidney). What side effects may I notice from receiving this medicine? Side effects that you should report to your doctor or health care professional as soon as possible: -allergic reactions like skin rash, itching or hives, swelling of the face, lips, or tongue -breathing problems -changes in blood pressure -feeling faint or lightheaded, falls -fever or chills -flushing, sweating, or hot feelings -swelling of the ankles or feet Side effects that usually do not require medical attention (Report these to your doctor or health care professional if they continue or are bothersome.): -diarrhea -headache -nausea, vomiting -stomach pain This list may not describe all possible side effects. Call your doctor for medical advice about side effects. You may report side effects to FDA at 1-800-FDA-1088. Where should I keep my medicine? This drug is given in a hospital or clinic and will not be stored at home. NOTE: This sheet is a summary. It may not cover all possible information. If you have questions about this medicine, talk to your doctor, pharmacist, or health care provider.    2016, Elsevier/Gold Standard. (2011-09-27 15:23:36)    Iron Deficiency Anemia, Adult Anemia is a condition in which there are less red blood cells or hemoglobin in the blood than normal. Hemoglobin is the part of red blood cells that carries oxygen.   Iron deficiency anemia is anemia caused by too little iron. It is the most common type of anemia. It may leave you tired and short of breath. CAUSES   Lack of iron in the diet.  Poor absorption of iron, as seen with intestinal  disorders.  Intestinal bleeding.  Heavy periods. SIGNS AND SYMPTOMS  Mild anemia may not be noticeable. Symptoms may include:  Fatigue.  Headache.  Pale skin.  Weakness.  Tiredness.  Shortness of breath.  Dizziness.  Cold hands and feet.  Fast or irregular heartbeat. DIAGNOSIS  Diagnosis requires a thorough evaluation and physical exam by your health care provider. Blood tests are generally used to confirm iron deficiency anemia. Additional tests may be done to find the underlying cause of your anemia. These may include:  Testing for blood in the stool (fecal occult blood test).  A procedure to see inside the colon and rectum (colonoscopy).  A procedure to see inside the esophagus and stomach (endoscopy). TREATMENT  Iron deficiency anemia is treated by correcting the cause of the deficiency. Treatment may involve:  Adding iron-rich foods to your diet.  Taking iron supplements. Pregnant or breastfeeding women need to take extra iron because their normal diet usually does not provide the required amount.  Taking vitamins. Vitamin C improves the absorption of iron. Your health care provider may recommend that you take your iron tablets with a glass of orange juice or vitamin C supplement.  Medicines to make heavy menstrual flow lighter.  Surgery. HOME CARE INSTRUCTIONS   Take iron as directed by your health care provider.  If you cannot tolerate taking iron supplements by mouth, talk to your health care provider about taking them through a vein (intravenously) or an injection into a muscle.  For the best iron absorption, iron supplements should be taken on an empty stomach. If you cannot tolerate them on an empty stomach, you may need to take them with food.  Do not drink milk or take antacids at the same time as your iron supplements. Milk and antacids may interfere with the absorption of iron.  Iron supplements can cause constipation. Make sure to include fiber  in your diet to prevent constipation. A stool softener may also be recommended.  Take vitamins as directed by your health care provider.  Eat a diet rich in iron. Foods high in iron include liver, lean beef, whole-grain bread, eggs, dried fruit, and dark green leafy vegetables. SEEK IMMEDIATE MEDICAL CARE IF:   You faint. If this happens, do not drive. Call your local emergency services (911 in [place] if no other help is available.  You have chest pain.  You feel nauseous or vomit.  You have severe or increased shortness of breath with activity.  You feel weak.  You have a rapid heartbeat.  You have unexplained sweating.  You become light-headed when getting up from a chair or bed. MAKE SURE YOU:   Understand these instructions.  Will watch your condition.  Will get help right away if you are not doing well or get worse.   This information is not intended to replace advice given to you by your health care provider. Make sure you discuss any questions you have with your health care provider.   Document Released: 02/09/2000 Document Revised: 03/04/2014 Document Reviewed: 10/19/2012 Elsevier Interactive Patient Education 2016 Elsevier Inc.  

## 2015-11-02 ENCOUNTER — Ambulatory Visit: Payer: BLUE CROSS/BLUE SHIELD | Admitting: Dietician

## 2015-11-04 DIAGNOSIS — F509 Eating disorder, unspecified: Secondary | ICD-10-CM | POA: Diagnosis not present

## 2015-11-07 ENCOUNTER — Ambulatory Visit: Payer: BLUE CROSS/BLUE SHIELD | Admitting: Dietician

## 2015-11-13 ENCOUNTER — Encounter: Payer: BLUE CROSS/BLUE SHIELD | Attending: General Surgery | Admitting: Dietician

## 2015-11-13 DIAGNOSIS — Z713 Dietary counseling and surveillance: Secondary | ICD-10-CM | POA: Insufficient documentation

## 2015-11-13 DIAGNOSIS — Z6841 Body Mass Index (BMI) 40.0 and over, adult: Secondary | ICD-10-CM | POA: Diagnosis not present

## 2015-11-13 NOTE — Patient Instructions (Addendum)
Goals:  Follow Phase 3B: High Protein + Non-Starchy Vegetables  Eat 3-6 small meals/snacks, every 3-5 hrs  Increase lean protein foods to meet 60g goal  Once you can eat 30 grams of protein in a day, have just one protein shake per day  Increase fluid intake to 64oz +  Avoid drinking 15 minutes before, during and 30 minutes after eating  Aim for >30 min of physical activity daily  Eat protein first, then vegetables, then a little bit of starch or fruit  Surgery date: 11/07/2014 Surgery type: Sleeve Gastrectomy Start weight at [facility]: 355 lbs on 07/30/14 (highest weight 365 lbs) Weight today: 208.2 lbs  Weight change: 2 lbs loss (13.6 lbs fat mass loss) Total weight loss: 146 lbs (156 lbs) Weight loss goal: 175-190 lbs  TANITA  BODY COMP RESULTS  10/03/14 11/22/14 12/27/14 01/31/15 05/04/15 08/02/15 09/28/15 11/13/15   BMI (kg/m^2) 52.4 49.1 46.3 43.4 37.6 33.1 31.5 31.2   Fat Mass (lbs) 193 184.5 165.0 148.0 117 89.8 73.4 59.8   Fat Free Mass (lbs) 157 143.5 144.0 141.5 134 131 136.8 148.4   Total Body Water (lbs) 115 105.0 105.5 103.5 98 94.8 98.4 106.6

## 2015-11-13 NOTE — Progress Notes (Signed)
Follow-up visit:  12 months Post-Operative Sleeve Gastrectomy Surgery  Medical Nutrition Therapy:  Appt start time: 515 end time:  550  Primary concerns today: Post-operative Bariatric Surgery Nutrition Management. Returns with a 2 lbs weight loss and 13.6 lb fat mass loss. Feels like she may be retaining fluid.   Grandchild passed away yesterday soon after birth and understandably very upset. Also having a lot of stress at work.   Tried Powerade zero and liked it. Tried a small cupcake on her birthday and got sick from it. Can tolerate sugar from fruit but not desserts.    Joined Weight Watchers since it was free through work for the accountability.   Works out with the [employer] 3 x week.  Surgery date: 11/07/2014 Surgery type: Sleeve Gastrectomy Start weight at [facility]: 355 lbs on 07/30/14 (highest weight 365 lbs) Weight today: 208.2 lbs   Weight change: 2 lbs (16.4 lbs fat mass loss) Total weight loss: 146 lbs (156 lbs) Weight loss goal: 170-180 lbs  TANITA  BODY COMP RESULTS  10/03/14 11/22/14 12/27/14 01/31/15 05/04/15 08/02/15 09/28/15 11/13/15   BMI (kg/m^2) 52.4 49.1 46.3 43.4 37.6 33.1 31.5 31.2   Fat Mass (lbs) 193 184.5 165.0 148.0 117 89.8 73.4 59.8   Fat Free Mass (lbs) 157 143.5 144.0 141.5 134 131 136.8 148.4   Total Body Water (lbs) 115 105.0 105.5 103.5 98 94.8 98.4 106.6    Preferred Learning Style:   No preference indicated   Learning Readiness:   Ready  24-hr recall: B (AM): protein shake-Premier  (30g) Snk (AM): none or egg beater with cheese and salsa and avocado or fruit/carrots (0-12g) L (PM): 2-4 oz fish or chicken with vegetables (14-28g) Snk (PM): carrots or apple slices and PB2 or peanuts or half protein bar (5g) D (PM): 2-4 oz chicken breast and vegetables (14-28g) Snk (PM):   Fluid intake: 1 protein shake (11 oz) every day, 51 oz water, Powerade   Estimated total protein intake: ~90 g   Medications: see list Supplementation: taking - bariatric  multivitamin 2 x day, calcium 2 x day  CBG monitoring: none Average CBG per patient: N/A Last patient reported A1c: "5 something" per patient  Using straws: No Drinking while eating: No Hair loss: No Carbonated beverages: No N/V/D/C: No Dumping syndrome: sick when had some cake   Recent physical activity:  [employer] program 3 x week, 30 minutes cardio + 30 minutes strength training; 2 days of walking and running; walking on lunch breaks almost daily  Progress Towards Goal(s):  In progress.  Handouts given during visit include:  none   Nutritional Diagnosis:  [place]-3.3 Overweight/obesity related to past poor dietary habits and physical inactivity as evidenced by patient w/ recent sleeve gastrectomy surgery following dietary guidelines for continued weight loss.    Intervention:  Nutrition education/diet reinforcement. Goals:  Follow Phase 3B: High Protein + Non-Starchy Vegetables  Eat 3-6 small meals/snacks, every 3-5 hrs  Increase lean protein foods to meet 60g goal  Once you can eat 30 grams of protein in a day, have just one protein shake per day  Increase fluid intake to 64oz +  Avoid drinking 15 minutes before, during and 30 minutes after eating  Aim for >30 min of physical activity daily  Eat protein first, then vegetables, then a little bit of starch or fruit  Teaching Method Utilized:  Visual Auditory Hands on  Barriers to learning/adherence to lifestyle change: none  Demonstrated degree of understanding via:  Teach Back  Monitoring/Evaluation:  Dietary intake, exercise, and body weight. Follow up in 1 months for 13 month post-op visit.

## 2015-11-29 DIAGNOSIS — I10 Essential (primary) hypertension: Secondary | ICD-10-CM | POA: Diagnosis not present

## 2015-11-29 DIAGNOSIS — Z9884 Bariatric surgery status: Secondary | ICD-10-CM | POA: Diagnosis not present

## 2015-11-29 DIAGNOSIS — Z862 Personal history of diseases of the blood and blood-forming organs and certain disorders involving the immune mechanism: Secondary | ICD-10-CM | POA: Diagnosis not present

## 2015-11-29 DIAGNOSIS — E039 Hypothyroidism, unspecified: Secondary | ICD-10-CM | POA: Diagnosis not present

## 2015-12-09 DIAGNOSIS — F509 Eating disorder, unspecified: Secondary | ICD-10-CM | POA: Diagnosis not present

## 2015-12-26 ENCOUNTER — Encounter: Payer: BLUE CROSS/BLUE SHIELD | Attending: General Surgery | Admitting: Dietician

## 2015-12-26 DIAGNOSIS — Z6841 Body Mass Index (BMI) 40.0 and over, adult: Secondary | ICD-10-CM | POA: Diagnosis not present

## 2015-12-26 DIAGNOSIS — Z713 Dietary counseling and surveillance: Secondary | ICD-10-CM | POA: Diagnosis not present

## 2015-12-26 NOTE — Progress Notes (Signed)
Follow-up visit:  13 months Post-Operative Sleeve Gastrectomy Surgery  Medical Nutrition Therapy:  Appt start time: 730 end time:  23  Primary concerns today: Post-operative Bariatric Surgery Nutrition Management. Returns with a 7.6 lbs weight loss.  No longer holding on to water like she was last month.   Still having a lot of work stress.   Joined Weight [employer] since it was free through work for the accountability.   Works out with the [employer] 3 x week.  Surgery date: 11/07/2014 Surgery type: Sleeve Gastrectomy Start weight at [facility]: 355 lbs on 07/30/14 (highest weight 365 lbs) Weight today: 200.6 lbs   Weight change: 7.6 lbs  Total weight loss: 153.6 lbs (163.6 lbs) Weight loss goal: 170-180 lbs  TANITA  BODY COMP RESULTS  10/03/14 11/22/14 12/27/14 01/31/15 05/04/15 08/02/15 09/28/15 11/13/15 12/26/15   BMI (kg/m^2) 52.4 49.1 46.3 43.4 37.6 33.1 31.5 31.2 30.1   Fat Mass (lbs) 193 184.5 165.0 148.0 117 89.8 73.4 59.8 74.4   Fat Free Mass (lbs) 157 143.5 144.0 141.5 134 131 136.8 148.4 126.2   Total Body Water (lbs) 115 105.0 105.5 103.5 98 94.8 98.4 106.6 90.6    Preferred Learning Style:   No preference indicated   Learning Readiness:   Ready  24-hr recall: B (AM): protein shake-Slim Fast high protein  (20g) Snk (AM): none or banana, egg beater with cheese and salsa and avocado or fruit/carrots (0-12g) L (PM): wheat bread with cheese and 1.5 oz chicken  (14 g) Snk (PM): banana and string cheese or yogurt  (0-12) D (PM): 3 oz  chicken breast and vegetables (21g) Snk (PM):   Fluid intake: 1 protein shake (11 oz) every day, 68 oz water, Powerade zero Estimated total protein intake: 55-80 g   Medications: see list Supplementation: taking - bariatric vitamin 2 x day, calcium 2 x day  CBG monitoring: none Average CBG per patient: N/A Last patient reported A1c: "5 something" per patient   Using straws: No Drinking while eating: No Hair loss: No Carbonated beverages:  No N/V/D/C: No Dumping syndrome: sick when had some cake (none recently)  Recent physical activity:  [employer] program 3 x week, 30 minutes cardio + 30 minutes strength training; 2 days of walking and running; walking on lunch breaks almost daily  Progress Towards Goal(s):  In progress.  Handouts given during visit include:  none   Nutritional Diagnosis:  -3.3 Overweight/obesity related to past poor dietary habits and physical inactivity as evidenced by patient w/ recent sleeve gastrectomy surgery following dietary guidelines for continued weight loss.    Intervention:  Nutrition education/diet reinforcement. Goals:  Follow Phase 3B: High Protein + Non-Starchy Vegetables  Eat 3-6 small meals/snacks, every 3-5 hrs  Increase lean protein foods to meet 60g goal  Once you can eat 30 grams of protein in a day, have just one protein shake per day  Increase fluid intake to 64oz +  Avoid drinking 15 minutes before, during and 30 minutes after eating  Aim for >30 min of physical activity daily  Eat protein first, then vegetables, then a little bit of starch or fruit  Try having half piece of fruit with protein for snacks  You can stop taking B12   Teaching Method Utilized:  Visual Auditory Hands on  Barriers to learning/adherence to lifestyle change: none  Demonstrated degree of understanding via:  Teach Back   Monitoring/Evaluation:  Dietary intake, exercise, and body weight. Follow up in 1 months for 14 month post-op  visit. 

## 2015-12-26 NOTE — Patient Instructions (Addendum)
Goals:  Follow Phase 3B: High Protein + Non-Starchy Vegetables  Eat 3-6 small meals/snacks, every 3-5 hrs  Increase lean protein foods to meet 60g goal  Once you can eat 30 grams of protein in a day, have just one protein shake per day  Increase fluid intake to 64oz +  Avoid drinking 15 minutes before, during and 30 minutes after eating  Aim for >30 min of physical activity daily  Eat protein first, then vegetables, then a little bit of starch or fruit  Try having half piece of fruit with protein for snacks  You can stop taking B12  Surgery date: 11/07/2014 Surgery type: Sleeve Gastrectomy Start weight at [facility]: 355 lbs on 07/30/14 (highest weight 365 lbs) Weight today:200.6 lbs   Weight change: 7.6 lbs  Total weight loss: 153.6 lbs (163.6 lbs) Weight loss goal: 170-180 lbs  TANITA  BODY COMP RESULTS  10/03/14 11/22/14 12/27/14 01/31/15 05/04/15 08/02/15 09/28/15 11/13/15 12/26/15   BMI (kg/m^2) 52.4 49.1 46.3 43.4 37.6 33.1 31.5 31.2 30.1   Fat Mass (lbs) 193 184.5 165.0 148.0 117 89.8 73.4 59.8 74.4   Fat Free Mass (lbs) 157 143.5 144.0 141.5 134 131 136.8 148.4 126.2   Total Body Water (lbs) 115 105.0 105.5 103.5 98 94.8 98.4 106.6 90.6

## 2015-12-29 ENCOUNTER — Other Ambulatory Visit: Payer: Self-pay | Admitting: Obstetrics & Gynecology

## 2015-12-29 DIAGNOSIS — Z1231 Encounter for screening mammogram for malignant neoplasm of breast: Secondary | ICD-10-CM

## 2015-12-29 DIAGNOSIS — Z01411 Encounter for gynecological examination (general) (routine) with abnormal findings: Secondary | ICD-10-CM | POA: Diagnosis not present

## 2015-12-29 DIAGNOSIS — N920 Excessive and frequent menstruation with regular cycle: Secondary | ICD-10-CM | POA: Diagnosis not present

## 2015-12-29 DIAGNOSIS — I10 Essential (primary) hypertension: Secondary | ICD-10-CM | POA: Diagnosis not present

## 2015-12-30 DIAGNOSIS — H6980 Other specified disorders of Eustachian tube, unspecified ear: Secondary | ICD-10-CM | POA: Diagnosis not present

## 2016-01-05 DIAGNOSIS — F411 Generalized anxiety disorder: Secondary | ICD-10-CM | POA: Diagnosis not present

## 2016-01-11 IMAGING — RF DG UGI W/ KUB
14 of 18 series · 14 of 18 positions shown · non-contrast
Comparison: 07/01/2014

CLINICAL DATA: Bariatric screening.  Asymptomatic.

EXAM:
UPPER GI SERIES WITH KUB
TECHNIQUE: After obtaining a scout radiograph a routine upper GI series was
performed using thin barium
FLUOROSCOPY TIME:  Radiation Exposure Index (as provided by the
fluoroscopic device):
If the device does not provide the exposure index:
Fluoroscopy Time (in minutes and seconds):  1 minutes 12 seconds
Number of Acquired Images:

[Series 1: run · 1 of 1 slices shown (1 of 14)]
[im 1/1]
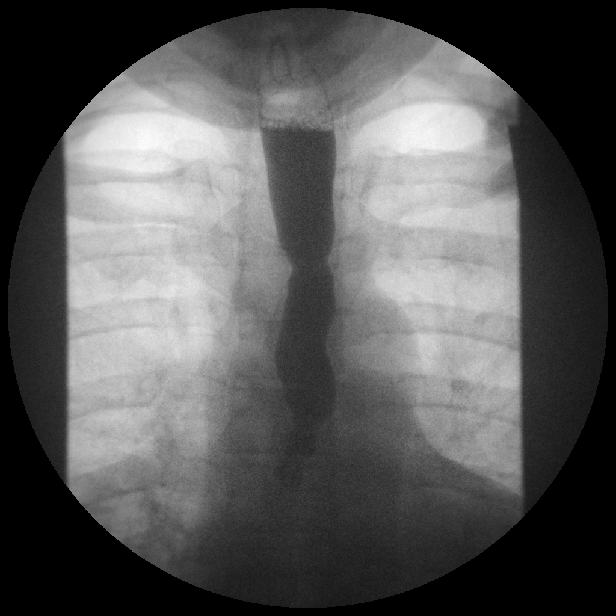

[Series 2: run · 1 of 1 slices shown (2 of 14)]
[im 1/1]
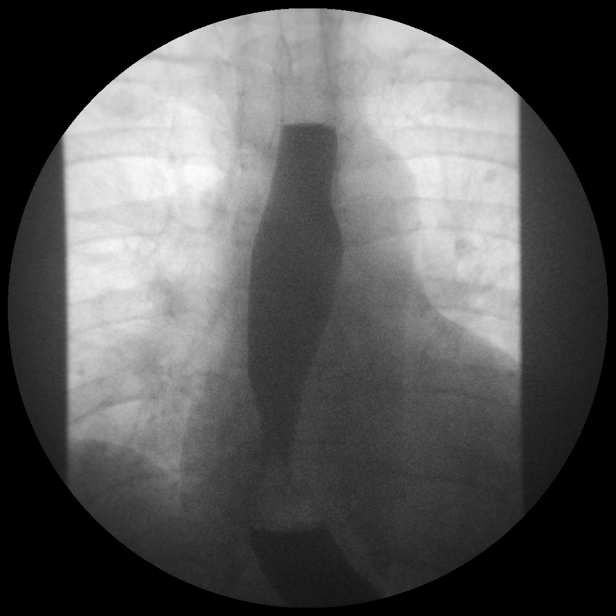

[Series 4: run · 1 of 1 slices shown (3 of 14)]
[im 1/1]
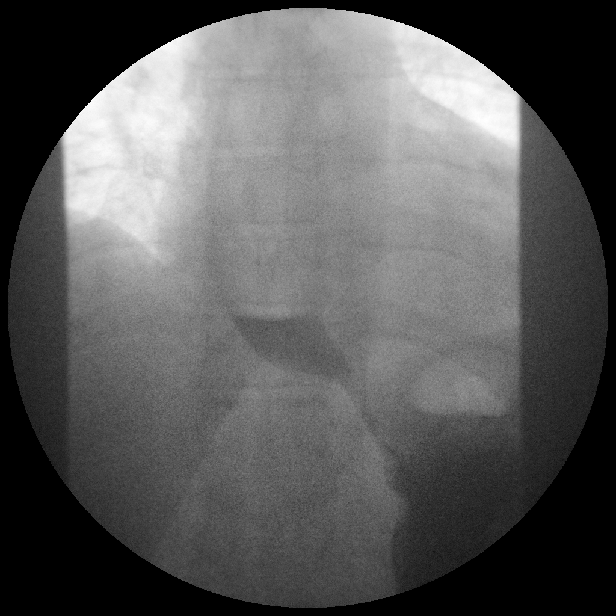

[Series 5: run · 1 of 1 slices shown (4 of 14)]
[im 1/1]
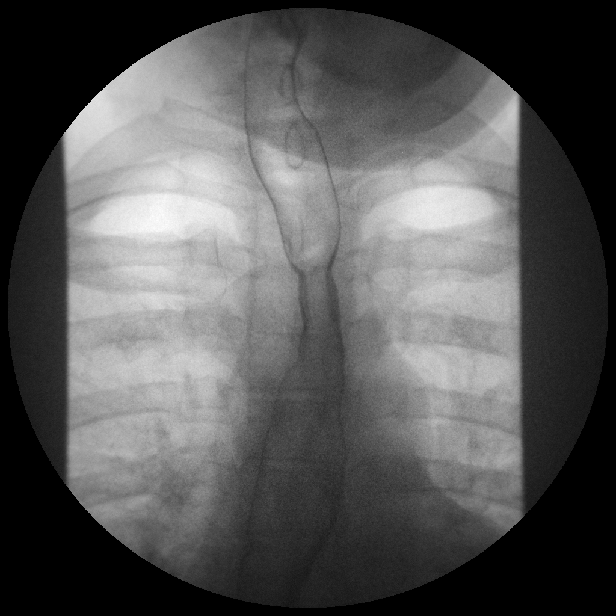

[Series 6: run · 1 of 1 slices shown (5 of 14)]
[im 1/1]
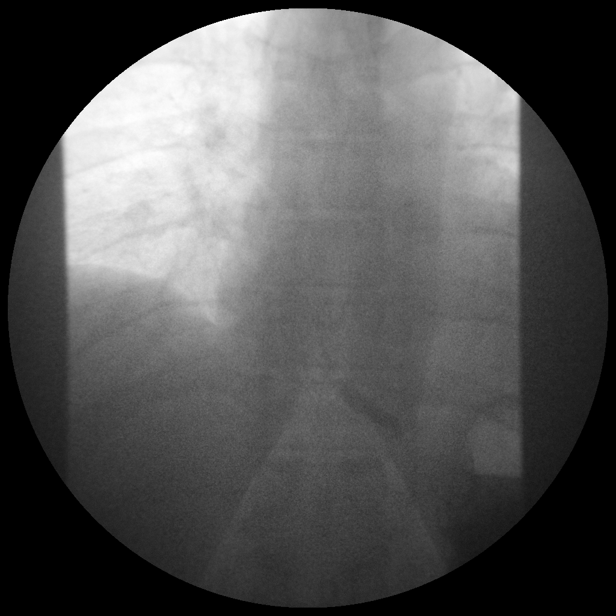

[Series 8: run · 1 of 1 slices shown (6 of 14)]
[im 1/1]
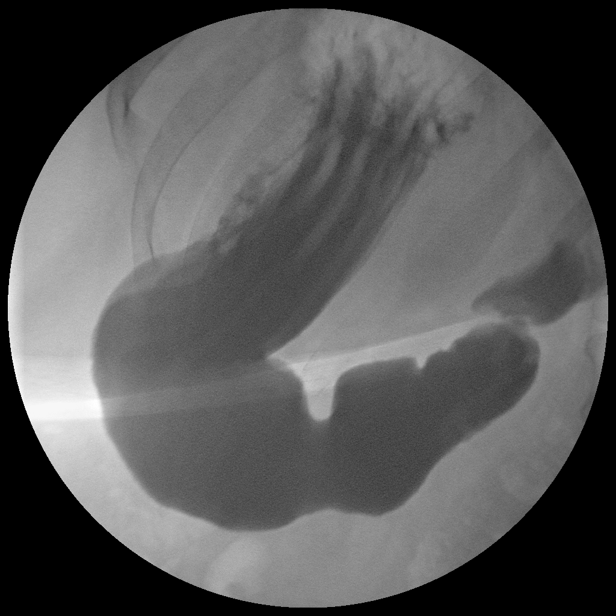

[Series 9: run · 1 of 1 slices shown (7 of 14)]
[im 1/1]
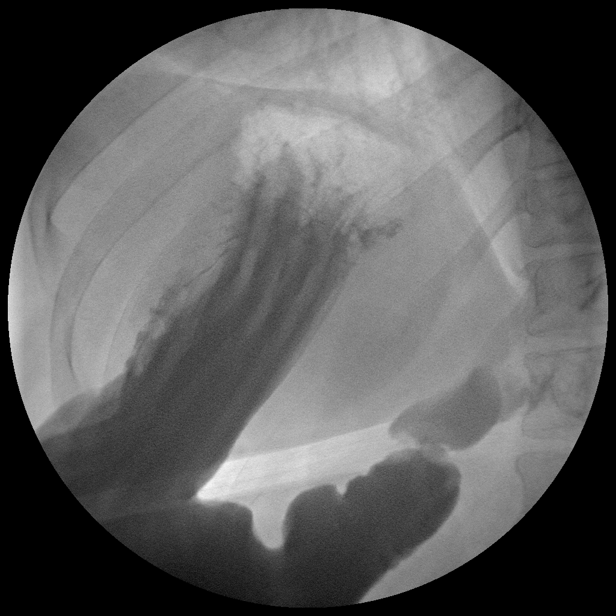

[Series 10: run · 1 of 1 slices shown (8 of 14)]
[im 1/1]
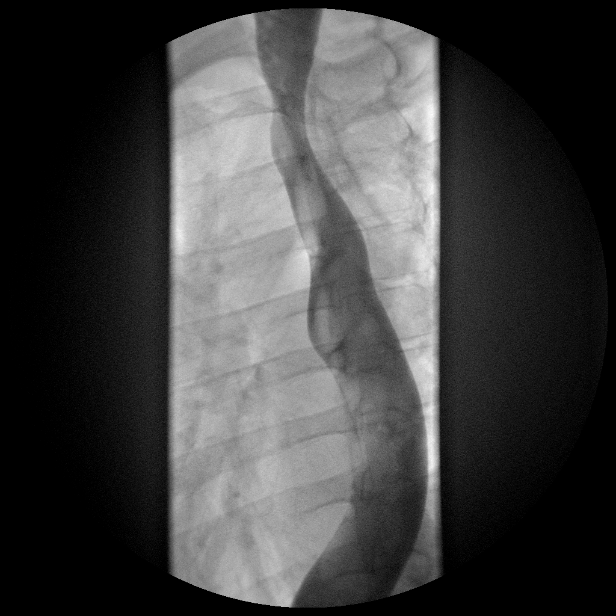

[Series 11: run · 1 of 1 slices shown (9 of 14)]
[im 1/1]
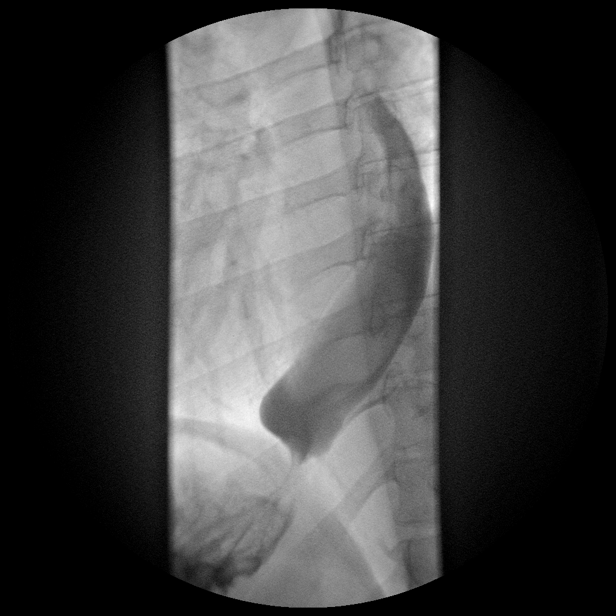

[Series 13: run · 1 of 1 slices shown (10 of 14)]
[im 1/1]
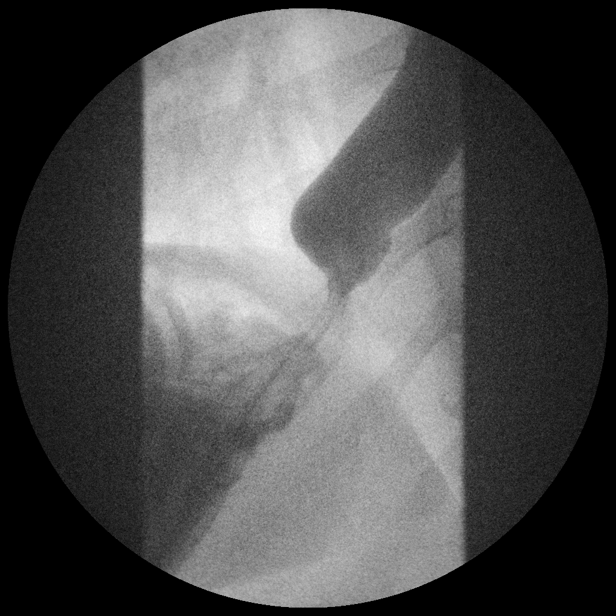

[Series 14: run · 1 of 1 slices shown (11 of 14)]
[im 1/1]
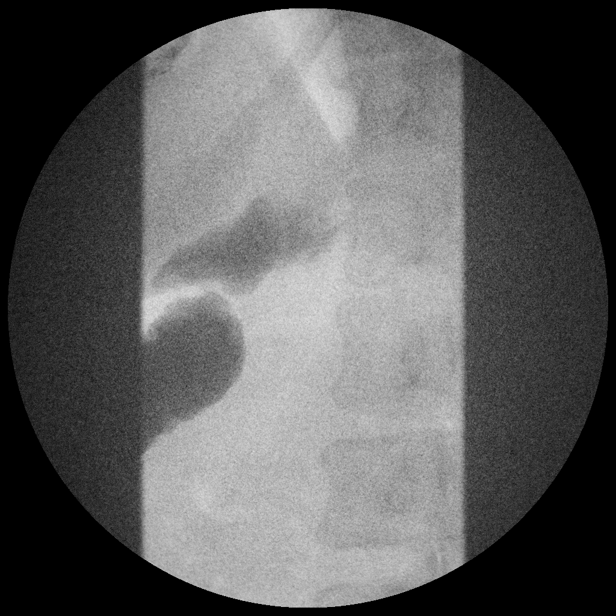

[Series 15: run · 1 of 1 slices shown (12 of 14)]
[im 1/1]
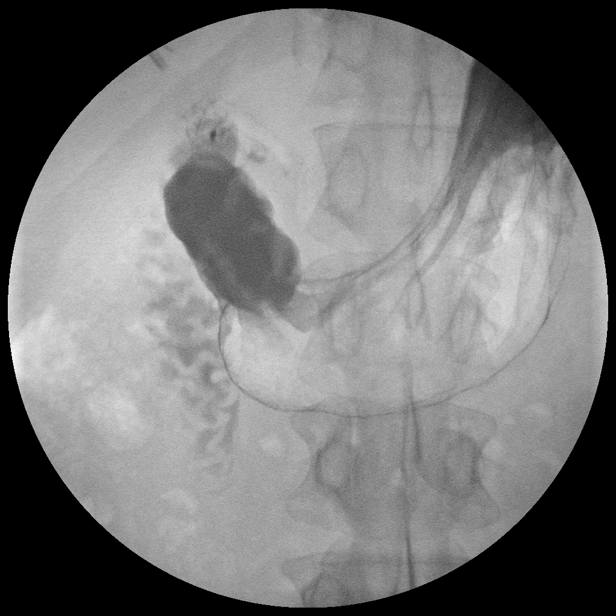

[Series 17: run · 1 of 1 slices shown (13 of 14)]
[im 1/1]
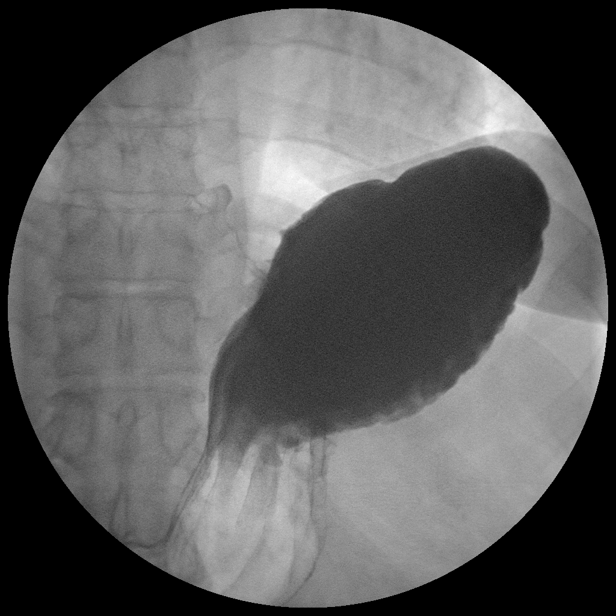

[Series 18: run · 1 of 1 slices shown (14 of 14)]
[im 1/1]
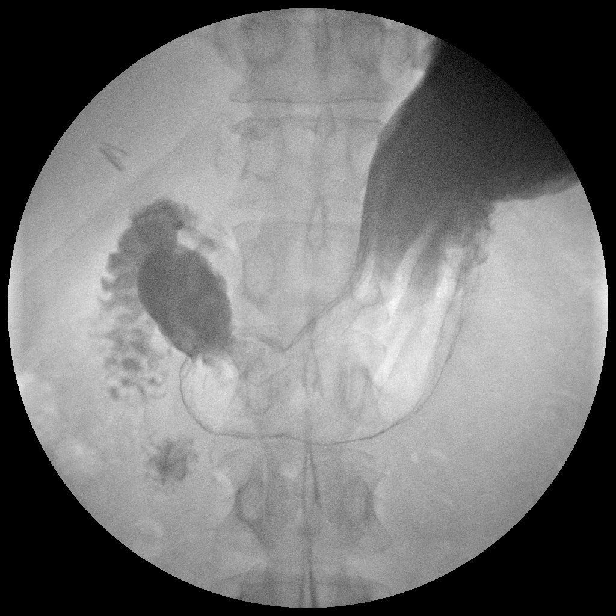

[14 of 18 positions shown; findings below may reference images not displayed]

FINDINGS: Cholecystectomy clips noted in the right upper quadrant of the
abdomen. There is moderate stool burden identified within the colon.
No dilated bowel loops noted. The esophagus is patent. The motility
of the esophagus is normal. Non rate limiting narrowing at the level
of the GE junction is noted. The stomach, duodenal bulb and sweep
appear normal. A small sliding-type hiatal hernia noted. No
significant reflux identified.
IMPRESSION: 1. Small hiatal hernia.
2. Mild narrowing at the distal esophagus is non rate limiting.
3. No reflux.

## 2016-01-22 DIAGNOSIS — E7800 Pure hypercholesterolemia, unspecified: Secondary | ICD-10-CM | POA: Diagnosis not present

## 2016-01-22 DIAGNOSIS — Z9884 Bariatric surgery status: Secondary | ICD-10-CM | POA: Diagnosis not present

## 2016-01-22 DIAGNOSIS — Z79899 Other long term (current) drug therapy: Secondary | ICD-10-CM | POA: Diagnosis not present

## 2016-01-22 DIAGNOSIS — E119 Type 2 diabetes mellitus without complications: Secondary | ICD-10-CM | POA: Diagnosis not present

## 2016-01-22 DIAGNOSIS — E039 Hypothyroidism, unspecified: Secondary | ICD-10-CM | POA: Diagnosis not present

## 2016-01-22 DIAGNOSIS — Z7984 Long term (current) use of oral hypoglycemic drugs: Secondary | ICD-10-CM | POA: Diagnosis not present

## 2016-01-22 DIAGNOSIS — E559 Vitamin D deficiency, unspecified: Secondary | ICD-10-CM | POA: Diagnosis not present

## 2016-01-22 DIAGNOSIS — Z0000 Encounter for general adult medical examination without abnormal findings: Secondary | ICD-10-CM | POA: Diagnosis not present

## 2016-01-25 ENCOUNTER — Encounter: Payer: BLUE CROSS/BLUE SHIELD | Attending: General Surgery | Admitting: Dietician

## 2016-01-25 DIAGNOSIS — Z6841 Body Mass Index (BMI) 40.0 and over, adult: Secondary | ICD-10-CM | POA: Insufficient documentation

## 2016-01-25 DIAGNOSIS — Z713 Dietary counseling and surveillance: Secondary | ICD-10-CM | POA: Diagnosis not present

## 2016-01-25 NOTE — Patient Instructions (Addendum)
Goals:  Follow Phase 3B: High Protein + Non-Starchy Vegetables  Eat 3-6 small meals/snacks, every 3-5 hrs  Increase lean protein foods to meet 60g goal  Increase fluid intake to 64oz +  Avoid drinking 15 minutes before, during and 30 minutes after eating  Continue with exercise program  Eat protein first, then vegetables, then a little bit of starch or fruit  1000 calories on average: 112 grams of carbs (29 g per meal, 15 g per snack), 75 g of protein per meal, 28 g fat)  Surgery date: 11/07/2014 Surgery type: Sleeve Gastrectomy Start weight at [facility]: 355 lbs on 07/30/14 (highest weight 365 lbs) Weight today:200.6 lbs   Weight change: 7.6 lbs  Total weight loss: 153.6 lbs (163.6 lbs) Weight loss goal: 170-180 lbs TANITA  BODY COMP RESULTS  10/03/14 11/22/14 12/27/14 01/31/15 05/04/15 08/02/15 09/28/15 11/13/15 12/26/15 01/25/16   BMI (kg/m^2) 52.4 49.1 46.3 43.4 37.6 33.1 31.5 31.2 30.1 30.1   Fat Mass (lbs) 193 184.5 165.0 148.0 117 89.8 73.4 59.8 74.4 76.8   Fat Free Mass (lbs) 157 143.5 144.0 141.5 134 131 136.8 148.4 126.2 123.8   Total Body Water (lbs) 115 105.0 105.5 103.5 98 94.8 98.4 106.6 90.6 88.8

## 2016-01-25 NOTE — Progress Notes (Signed)
  Follow-up visit:  1 Year+ Post-Operative Sleeve Gastrectomy Surgery  Medical Nutrition Therapy:  Appt start time: 730 end time:  800  Primary concerns today: Post-operative Bariatric Surgery Nutrition Management. Returns with no weight change. Brother in law passed away this past month so have a very difficult time. Got off track but is now back on track.   Organizing lunch and dinner more than she used to. Stopped eating nighttime snack.   Works out with the [employer] 3 x week.  Surgery date: 11/07/2014 Surgery type: Sleeve Gastrectomy Start weight at [facility]: 355 lbs on 07/30/14 (highest weight 365 lbs) Weight today: 200.6 lbs   Weight change: no change Total weight loss: 153.6 lbs (163.6 lbs) Weight loss goal: 170-180 lbs  TANITA  BODY COMP RESULTS  10/03/14 11/22/14 12/27/14 01/31/15 05/04/15 08/02/15 09/28/15 11/13/15 12/26/15 01/25/16   BMI (kg/m^2) 52.4 49.1 46.3 43.4 37.6 33.1 31.5 31.2 30.1 30.1   Fat Mass (lbs) 193 184.5 165.0 148.0 117 89.8 73.4 59.8 74.4 76.8   Fat Free Mass (lbs) 157 143.5 144.0 141.5 134 131 136.8 148.4 126.2 123.8   Total Body Water (lbs) 115 105.0 105.5 103.5 98 94.8 98.4 106.6 90.6 88.8    Preferred Learning Style:   No preference indicated   Learning Readiness:   Ready  24-hr recall: B (AM): protein shake-Slim Fast high protein  (20g) Snk (AM): most days egg beater with cheese and salsa and avocado or fruit/carrots (0-12g) Snk (AM): apple or banana  or protein  L (PM): low fat greek yogurt and 1.5 chicken  (20 g) Snk (PM): banana and string cheese or yogurt  (0-12) D (PM): Steak fajitas or chicken and vegetable  (17 g) Snk (PM): none   Fluid intake: 1 protein shake (11 oz) every day, 68 oz water, Powerade zero Estimated total protein intake: over 60 g   Medications: see list Supplementation: taking - bariatric vitamin 2 x day, calcium 2 x day  CBG monitoring: none Average CBG per patient: N/A Last patient reported A1c: "5 something" per  patient   Using straws: No Drinking while eating: No Hair loss: No Carbonated beverages: No N/V/D/C: No Dumping syndrome: sick when had some cake (none recently)  Recent physical activity:  [employer] program 3 x week, 30 minutes cardio + 30 minutes strength training; 2 days of walking and running; walking on lunch breaks almost daily  Progress Towards Goal(s):  In progress.  Handouts given during visit include:  none   Nutritional Diagnosis:  [place]-3.3 Overweight/obesity related to past poor dietary habits and physical inactivity as evidenced by patient w/ recent sleeve gastrectomy surgery following dietary guidelines for continued weight loss.    Intervention:  Nutrition education/diet reinforcement. Goals:  Follow Phase 3B: High Protein + Non-Starchy Vegetables  Eat 3-6 small meals/snacks, every 3-5 hrs  Increase lean protein foods to meet 60g goal  Increase fluid intake to 64oz +  Avoid drinking 15 minutes before, during and 30 minutes after eating  Continue with exercise program  Eat protein first, then vegetables, then a little bit of starch or fruit  1000 calories on average: 112 grams of carbs (29 g per meal, 15 g per snack), 75 g of protein per meal, 28 g fat)   Teaching Method Utilized:  Visual Auditory Hands on  Barriers to learning/adherence to lifestyle change: none  Demonstrated degree of understanding via:  Teach Back   Monitoring/Evaluation:  Dietary intake, exercise, and body weight. Follow up in 1 month

## 2016-01-27 DIAGNOSIS — F509 Eating disorder, unspecified: Secondary | ICD-10-CM | POA: Diagnosis not present

## 2016-01-30 ENCOUNTER — Ambulatory Visit
Admission: RE | Admit: 2016-01-30 | Discharge: 2016-01-30 | Disposition: A | Discharge status: Home / Self Care | Payer: BLUE CROSS/BLUE SHIELD | Source: Ambulatory Visit | Attending: Obstetrics & Gynecology | Admitting: Obstetrics & Gynecology

## 2016-01-30 DIAGNOSIS — Z1231 Encounter for screening mammogram for malignant neoplasm of breast: Secondary | ICD-10-CM | POA: Diagnosis not present

## 2016-02-14 ENCOUNTER — Ambulatory Visit: Payer: BLUE CROSS/BLUE SHIELD | Admitting: Dietician

## 2016-02-21 ENCOUNTER — Ambulatory Visit: Payer: BLUE CROSS/BLUE SHIELD | Admitting: Dietician

## 2016-02-24 ENCOUNTER — Ambulatory Visit (HOSPITAL_COMMUNITY)
Admission: EM | Admit: 2016-02-24 | Discharge: 2016-02-24 | Disposition: A | Discharge status: Home / Self Care | Payer: BLUE CROSS/BLUE SHIELD | Attending: Emergency Medicine | Admitting: Emergency Medicine

## 2016-02-24 ENCOUNTER — Encounter (HOSPITAL_COMMUNITY): Payer: Self-pay | Admitting: *Deleted

## 2016-02-24 DIAGNOSIS — J0191 Acute recurrent sinusitis, unspecified: Secondary | ICD-10-CM

## 2016-02-24 DIAGNOSIS — K0889 Other specified disorders of teeth and supporting structures: Secondary | ICD-10-CM

## 2016-02-24 DIAGNOSIS — F509 Eating disorder, unspecified: Secondary | ICD-10-CM | POA: Diagnosis not present

## 2016-02-24 MED ORDER — DEXAMETHASONE SODIUM PHOSPHATE 10 MG/ML IJ SOLN
10.0000 mg | Freq: Once | INTRAMUSCULAR | Status: AC
  Administered 2016-02-24: 10 mg via INTRAMUSCULAR

## 2016-02-24 MED ORDER — CIPROFLOXACIN HCL 500 MG PO TABS: 500.0000 mg | ORAL_TABLET | Freq: Two times a day (BID) | ORAL | 0 refills | Status: DC

## 2016-02-24 MED ORDER — TRAMADOL HCL 50 MG PO TABS: 50.0000 mg | ORAL_TABLET | Freq: Four times a day (QID) | ORAL | 0 refills | Status: DC | PRN

## 2016-02-24 MED ORDER — DEXAMETHASONE SODIUM PHOSPHATE 10 MG/ML IJ SOLN
INTRAMUSCULAR | Status: AC
  Filled 2016-02-24: qty 1

## 2016-02-24 NOTE — ED Triage Notes (Signed)
HAS   A  TOOTHACHE      L   SIDE  LOWER       HURTS  TO  CHEW         ALSO  HAS  HEADACHES    AND  SINUS  PRESSURE  X  1   WEEK  GETTING  WORSE

## 2016-02-24 NOTE — ED Provider Notes (Signed)
CSN: [id number]     Arrival date & time 02/24/16  1411 History   None    Chief Complaint  Patient presents with  . Dental Pain   (Consider location/radiation/quality/duration/timing/severity/associated sxs/prior Treatment) 45 y.o. female presents with sinus pain and pressure X 1 week and dental pain X 2 days. . Condition is acute in nature. Condition is made better by nothing. Condition is made worse by by bending over and eating. Patient denies any relief from claritin and flonase prior to there arrival at this facility. Patient denies any fevers and states that the flonase and claritin have improved any nasal congestion.        Past Medical History:  Diagnosis Date  . Diabetes mellitus without complication ([place])   . History of hiatal hernia   . Hypertension   . Iron deficiency anemia due to chronic blood loss    menstrual cycles   Past Surgical History:  Procedure Laterality Date  . BREATH TEK H PYLORI N/A 07/22/2014   Procedure: BREATH TEK H PYLORI;  Surgeon: Greer Pickerel, MD;  Location: [facility];  Service: General;  Laterality: N/A;  . CESAREAN SECTION    . CHOLECYSTECTOMY    . LAPAROSCOPIC GASTRIC SLEEVE RESECTION N/A 11/07/2014   Procedure: LAPAROSCOPIC GASTRIC SLEEVE RESECTION HIATAL HERNIA  REPAIR/ UPPER ENDO;  Surgeon: Greer Pickerel, MD;  Location: [facility];  Service: General;  Laterality: N/A;  . TUBAL LIGATION    . UPPER GI ENDOSCOPY  11/07/2014   Procedure: UPPER GI ENDOSCOPY;  Surgeon: Greer Pickerel, MD;  Location: [facility];  Service: General;;   Family History  Problem Relation Age of Onset  . Anemia Mother   . Anemia Paternal Grandfather    Social History  Substance Use Topics  . Smoking status: Never Smoker  . Smokeless tobacco: Never Used  . Alcohol use No   OB History    No data available     Review of Systems  HENT: Positive for dental problem, sinus pain and sinus pressure.        Pain with palpation to maxillary, ethmoid and frontal sinuses.    Respiratory: Negative.   Cardiovascular: Negative.     Allergies  Shellfish allergy and Augmentin [amoxicillin-pot clavulanate]  Home Medications   Prior to Admission medications   Medication Sig Start Date End Date Taking? Authorizing Provider  Calcium Carb-Cholecalciferol (CALCIUM 600 + D PO) Take 1 tablet by mouth 3 (three) times daily.    Historical Provider, MD  Cyanocobalamin (B-12) 3000 MCG CAPS Take 1 capsule by mouth once a week. Wednesday    Historical Provider, MD  fluocinonide (LIDEX) 0.05 % external solution Apply 1 application topically 2 (two) times daily as needed (for itchy scalp).  07/16/14   Historical Provider, MD  fluticasone (FLONASE) 50 MCG/ACT nasal spray Place 2 sprays into both nostrils daily. Patient taking differently: Place 2 sprays into both nostrils daily as needed.  05/14/14   Drenda Freeze, MD  ibuprofen (ADVIL,MOTRIN) 600 MG tablet Take 1 tablet (600 mg total) by mouth every 6 (six) hours as needed. 02/26/15   Marella Chimes, PA-C  levothyroxine (SYNTHROID, LEVOTHROID) 50 MCG tablet Take 50 mcg by mouth daily before breakfast.    Historical Provider, MD  loratadine (CLARITIN) 10 MG tablet Take 10 mg by mouth daily as needed for allergies.     Historical Provider, MD  Multiple Vitamins-Minerals (OPTISOURCE POST BARIATRIC SURG) CHEW Chew 2 tablets by mouth 2 (two) times daily.    Historical Provider,  MD  NEXIUM 40 MG capsule Take 40 mg by mouth daily as needed (indigestion.).  10/27/14   Historical Provider, MD  oxyCODONE (ROXICODONE) 5 MG/5ML solution Take 5-10 mg by mouth every 4 (four) hours as needed for moderate pain or severe pain.    Historical Provider, MD  tranexamic acid (LYSTEDA) 650 MG TABS tablet Take 1,950 mg by mouth 3 (three) times daily. x5 days during menstrual cycle. 11/24/14   Historical Provider, MD   Meds Ordered and Administered this Visit   Medications  dexamethasone (DECADRON) injection 10 mg (not administered)    BP 170/80  (BP Location: Left Arm)   Pulse 65   Temp 98.7 F (37.1 C) (Oral)   Resp 18   LMP 02/10/2016   SpO2 100%  No data found.   Physical Exam  Constitutional: She is oriented to person, place, and time. She appears well-developed and well-nourished.  HENT:  Head: Normocephalic and atraumatic.  Eyes: Conjunctivae are normal.  Neck: Normal range of motion.  Cardiovascular: Normal rate and regular rhythm.   Pulmonary/Chest: Effort normal and breath sounds normal.  Musculoskeletal: Normal range of motion.  Neurological: She is alert and oriented to person, place, and time.  Skin: Skin is warm.  Psychiatric: She has a normal mood and affect.  Nursing note and vitals reviewed.   Urgent Care Course   Clinical Course     Procedures (including critical care time)  Labs Review Labs Reviewed - No data to display  Imaging Review No results found.     MDM   1. Pain, dental   2. Acute recurrent sinusitis, unspecified location        Jacqualine Mau, NP 02/24/16 1620

## 2016-03-19 ENCOUNTER — Ambulatory Visit: Payer: BLUE CROSS/BLUE SHIELD | Admitting: Dietician

## 2016-03-20 ENCOUNTER — Encounter: Payer: BLUE CROSS/BLUE SHIELD | Attending: General Surgery | Admitting: Dietician

## 2016-03-20 ENCOUNTER — Encounter: Payer: Self-pay | Admitting: Dietician

## 2016-03-20 DIAGNOSIS — Z6841 Body Mass Index (BMI) 40.0 and over, adult: Secondary | ICD-10-CM | POA: Insufficient documentation

## 2016-03-20 DIAGNOSIS — Z713 Dietary counseling and surveillance: Secondary | ICD-10-CM | POA: Diagnosis not present

## 2016-03-20 NOTE — Progress Notes (Signed)
  Follow-up visit:  1.25 Years Post-Operative Sleeve Gastrectomy Surgery  Medical Nutrition Therapy:  Appt start time: [id number] end time:  840  Primary concerns today: Post-operative Bariatric Surgery Nutrition Management.  Cynthia Vazquez returns having lost 4.6 pounds and states [place] is [place] Feeling good about her weight loss but would still like to reach 180 lbs. She is feeling okay with slowed weight loss and weight loss stalls.  Has struggled with anxiety recently with several deaths in her family.  Works out with the [employer] 3 x week.  Surgery date: 11/07/2014 Surgery type: Sleeve Gastrectomy Start weight at [facility]: 355 lbs on 07/30/14 (highest weight 365 lbs) Weight today: 196 lbs Weight change: 4.6 lbs Total weight loss: 167 lbs Weight loss goal: 180 lbs  TANITA  BODY COMP RESULTS  10/03/14 11/22/14 12/27/14 01/31/15 05/04/15 08/02/15 09/28/15 11/13/15 12/26/15 01/25/16 03/20/16   BMI (kg/m^2) 52.4 49.1 46.3 43.4 37.6 33.1 31.5 31.2 30.1 30.1 29.4   Fat Mass (lbs) 193 184.5 165.0 148.0 117 89.8 73.4 59.8 74.4 76.8 74.2   Fat Free Mass (lbs) 157 143.5 144.0 141.5 134 131 136.8 148.4 126.2 123.8 121.8   Total Body Water (lbs) 115 105.0 105.5 103.5 98 94.8 98.4 106.6 90.6 88.8 87.2    Preferred Learning Style:   No preference indicated   Learning Readiness:   Ready  24-hr recall: B (AM): protein shake-Slim Fast high protein  (20g) Snk (AM): most days egg beater with cheese and salsa and avocado or fruit/carrots (0-12g) Snk (AM): apple or banana  or protein  L (PM): low fat greek yogurt and 1.5 chicken  (20 g) Snk (PM): banana and string cheese or yogurt  (0-12) D (PM): Steak fajitas or chicken and vegetable  (17 g) Snk (PM): none   Fluid intake: 1 protein shake (11 oz) every day, 68 oz water, Powerade zero Estimated total protein intake: over 60 g   Medications: see list Supplementation: taking - bariatric vitamin 2 x day, calcium 2 x day  CBG monitoring:  none Average CBG per patient: N/A Last patient reported A1c: "5 something" per patient   Using straws: No Drinking while eating: No Hair loss: No Carbonated beverages: No N/V/D/C: No Dumping syndrome: sick when had some cake (none recently)  Recent physical activity:  [employer] program 3 x week, 30 minutes cardio + 30 minutes strength training; 2 days of walking and running; walking on lunch breaks almost daily  Progress Towards Goal(s):  In progress.  Handouts given during visit include:  none   Nutritional Diagnosis:  [place]-3.3 Overweight/obesity related to past poor dietary habits and physical inactivity as evidenced by patient w/ recent sleeve gastrectomy surgery following dietary guidelines for continued weight loss.    Intervention:  Nutrition education/diet reinforcement.  Teaching Method Utilized:  Visual Auditory Hands on  Barriers to learning/adherence to lifestyle change: none  Demonstrated degree of understanding via:  Teach Back   Monitoring/Evaluation:  Dietary intake, exercise, and body weight. Follow up in 2 months

## 2016-03-20 NOTE — Patient Instructions (Addendum)
Goals:  Follow Phase 3B: High Protein + Non-Starchy Vegetables  Eat 3-6 small meals/snacks, every 3-5 hrs  Increase lean protein foods to meet 60g goal  Increase fluid intake to 64oz +  Avoid drinking 15 minutes before, during and 30 minutes after eating  Continue with exercise program  Eat protein first, then vegetables, then a little bit of starch or fruit  1000 calories on average: 112 grams of carbs (29 g per meal, 15 g per snack), 75 g of protein per meal, 28 g fat)  Surgery date: 11/07/2014 Surgery type: Sleeve Gastrectomy Start weight at [facility]: 355 lbs on 07/30/14 (highest weight 365 lbs) Weight today: 196 lbs Weight change: 4.6 lbs Total weight loss: 167 lbs Weight loss goal: 180 lbs  TANITA  BODY COMP RESULTS  10/03/14 11/22/14 12/27/14 01/31/15 05/04/15 08/02/15 09/28/15 11/13/15 12/26/15 01/25/16 03/20/16   BMI (kg/m^2) 52.4 49.1 46.3 43.4 37.6 33.1 31.5 31.2 30.1 30.1 29.4   Fat Mass (lbs) 193 184.5 165.0 148.0 117 89.8 73.4 59.8 74.4 76.8 74.2   Fat Free Mass (lbs) 157 143.5 144.0 141.5 134 131 136.8 148.4 126.2 123.8 121.8   Total Body Water (lbs) 115 105.0 105.5 103.5 98 94.8 98.4 106.6 90.6 88.8 87.2

## 2016-03-23 DIAGNOSIS — F509 Eating disorder, unspecified: Secondary | ICD-10-CM | POA: Diagnosis not present

## 2016-05-04 DIAGNOSIS — K0889 Other specified disorders of teeth and supporting structures: Secondary | ICD-10-CM | POA: Diagnosis not present

## 2016-05-21 ENCOUNTER — Encounter: Payer: BLUE CROSS/BLUE SHIELD | Attending: General Surgery | Admitting: Skilled Nursing Facility1

## 2016-05-21 ENCOUNTER — Encounter: Payer: Self-pay | Admitting: Skilled Nursing Facility1

## 2016-05-21 DIAGNOSIS — Z6841 Body Mass Index (BMI) 40.0 and over, adult: Secondary | ICD-10-CM | POA: Diagnosis not present

## 2016-05-21 DIAGNOSIS — E119 Type 2 diabetes mellitus without complications: Secondary | ICD-10-CM

## 2016-05-21 DIAGNOSIS — Z713 Dietary counseling and surveillance: Secondary | ICD-10-CM | POA: Diagnosis not present

## 2016-05-21 NOTE — Progress Notes (Signed)
Follow-up visit:  1.25 Years Post-Operative Sleeve Gastrectomy Surgery  Medical Nutrition Therapy:  Appt start time: 416 end time:  840  Primary concerns today: Post-operative Bariatric Surgery Nutrition Management.  Cynthia Vazquez returns having lost 4.6 pounds and states [place] is [place] in [place] Feeling good about her weight loss but would still like to reach 180 lbs. She is feeling okay with slowed weight loss and weight loss stalls.  Has struggled with anxiety recently with several deaths in her family.  Works out with the [employer] 3 x week.   Pt states she has had significant Stress from work and has quit that job and started a new one with [employer]. Pt states she Gets iron infusions as needed. Pt spent the appointment crying from her stress that will hopefully be relieved by her new position. Pt states when she gets stressed she goes for a walk. Pt states she needs help with figuring out how to eat at work when she used to work from home. Pt declined to take her wt today.   Surgery date: 11/07/2014 Surgery type: Sleeve Gastrectomy Start weight at [facility]: 355 lbs on 07/30/14 (highest weight 365 lbs) Weight today: 196 lbs Weight change: 4.6 lbs Total weight loss: 167 lbs Weight loss goal: 180 lbs  TANITA  BODY COMP RESULTS  10/03/14 11/22/14 12/27/14 01/31/15 05/04/15 08/02/15 09/28/15 11/13/15 12/26/15 01/25/16 03/20/16   BMI (kg/m^2) 52.4 49.1 46.3 43.4 37.6 33.1 31.5 31.2 30.1 30.1 29.4   Fat Mass (lbs) 193 184.5 165.0 148.0 117 89.8 73.4 59.8 74.4 76.8 74.2   Fat Free Mass (lbs) 157 143.5 144.0 141.5 134 131 136.8 148.4 126.2 123.8 121.8   Total Body Water (lbs) 115 105.0 105.5 103.5 98 94.8 98.4 106.6 90.6 88.8 87.2    Preferred Learning Style:   No preference indicated   Learning Readiness:   Ready  24-hr recall: B (AM): prermeir or slim fast protein  (20g) Snk (AM): most days egg beater with cheese with [place] bacon and salsa and avocado or fruit/carrots (0-12g) Snk (AM):  apple or banana  or protein  L (PM): low fat greek yogurt and 1.5 chicken and broccoli  (20 g) Snk (PM): banana and string cheese or yogurt  (0-12) D (PM): Steak fajitas or chicken and vegetable  (17 g) Snk (PM): none   Fluid intake: 1 protein shake (11 oz) every day, 68 oz water, Powerade zero Estimated total protein intake: over 60 g   Medications: see list Supplementation: taking - bariatric vitamin 2 x day, calcium 2 x day  CBG monitoring: yes Average CBG per patient: 80-90'sk Last patient reported A1c: "5 something" per patient   Using straws: No Drinking while eating: No Hair loss: No Carbonated beverages: No N/V/D/C: No Dumping syndrome: sick when had some cake (none recently)  Recent physical activity:  [employer] program 3 x week, 30 minutes cardio + 30 minutes strength training; 2 days of walking and running; walking on lunch breaks almost daily, yoga  Progress Towards Goal(s):  In progress.  Handouts given during visit include:  none   Nutritional Diagnosis:  [place]-3.3 Overweight/obesity related to past poor dietary habits and physical inactivity as evidenced by patient w/ recent sleeve gastrectomy surgery following dietary guidelines for continued weight loss.    Intervention:  Nutrition education/diet reinforcement.  Teaching Method Utilized:  Visual Auditory Hands on  Barriers to learning/adherence to lifestyle change: none  Demonstrated degree of understanding via:  Teach Back   Monitoring/Evaluation:  Dietary  intake, exercise, and body weight. Follow up in 2 months

## 2016-05-28 DIAGNOSIS — R0789 Other chest pain: Secondary | ICD-10-CM | POA: Diagnosis not present

## 2016-05-28 DIAGNOSIS — D649 Anemia, unspecified: Secondary | ICD-10-CM | POA: Diagnosis not present

## 2016-05-28 DIAGNOSIS — Z79899 Other long term (current) drug therapy: Secondary | ICD-10-CM | POA: Diagnosis not present

## 2016-05-28 DIAGNOSIS — R079 Chest pain, unspecified: Secondary | ICD-10-CM | POA: Diagnosis not present

## 2016-05-28 DIAGNOSIS — R918 Other nonspecific abnormal finding of lung field: Secondary | ICD-10-CM | POA: Diagnosis not present

## 2016-05-28 DIAGNOSIS — S0990XA Unspecified injury of head, initial encounter: Secondary | ICD-10-CM | POA: Diagnosis not present

## 2016-05-28 DIAGNOSIS — E079 Disorder of thyroid, unspecified: Secondary | ICD-10-CM | POA: Diagnosis not present

## 2016-05-28 DIAGNOSIS — R51 Headache: Secondary | ICD-10-CM | POA: Diagnosis not present

## 2016-05-29 ENCOUNTER — Telehealth: Payer: Self-pay | Admitting: Hematology and Oncology

## 2016-05-29 ENCOUNTER — Other Ambulatory Visit: Payer: Self-pay | Admitting: Hematology and Oncology

## 2016-05-29 ENCOUNTER — Telehealth: Payer: Self-pay | Admitting: *Deleted

## 2016-05-29 DIAGNOSIS — I10 Essential (primary) hypertension: Secondary | ICD-10-CM | POA: Diagnosis not present

## 2016-05-29 DIAGNOSIS — R51 Headache: Secondary | ICD-10-CM | POA: Diagnosis not present

## 2016-05-29 NOTE — Telephone Encounter (Signed)
LM with note below 

## 2016-05-29 NOTE — Telephone Encounter (Signed)
I will see her next Thursday and IV iron (first available). I will send scheduling msg

## 2016-05-29 NOTE — Telephone Encounter (Signed)
Called patient to inform her of next scheduled appointments.  

## 2016-05-29 NOTE — Telephone Encounter (Signed)
Patient left a message stating she went to ED yesterday and iron level is 9.5. Is requesting an appt for iron infusion. Attempted to call patient back, no answer.

## 2016-06-01 DIAGNOSIS — M791 Myalgia: Secondary | ICD-10-CM | POA: Diagnosis not present

## 2016-06-06 ENCOUNTER — Other Ambulatory Visit: Payer: BLUE CROSS/BLUE SHIELD

## 2016-06-06 ENCOUNTER — Ambulatory Visit: Payer: BLUE CROSS/BLUE SHIELD | Admitting: Hematology and Oncology

## 2016-06-06 ENCOUNTER — Other Ambulatory Visit: Payer: Self-pay | Admitting: Hematology and Oncology

## 2016-06-06 ENCOUNTER — Ambulatory Visit: Payer: BLUE CROSS/BLUE SHIELD

## 2016-06-06 ENCOUNTER — Encounter: Payer: Self-pay | Admitting: Hematology and Oncology

## 2016-06-13 ENCOUNTER — Ambulatory Visit: Payer: BLUE CROSS/BLUE SHIELD

## 2016-07-25 ENCOUNTER — Ambulatory Visit: Payer: BLUE CROSS/BLUE SHIELD | Admitting: Skilled Nursing Facility1

## 2016-08-01 ENCOUNTER — Other Ambulatory Visit (HOSPITAL_BASED_OUTPATIENT_CLINIC_OR_DEPARTMENT_OTHER): Payer: BLUE CROSS/BLUE SHIELD

## 2016-08-01 ENCOUNTER — Other Ambulatory Visit: Payer: Self-pay | Admitting: Hematology and Oncology

## 2016-08-01 ENCOUNTER — Telehealth: Payer: Self-pay | Admitting: *Deleted

## 2016-08-01 DIAGNOSIS — D500 Iron deficiency anemia secondary to blood loss (chronic): Secondary | ICD-10-CM

## 2016-08-01 LAB — CBC & DIFF AND RETIC
BASO%: 1.6 % (ref 0.0–2.0)
BASOS ABS: 0 10*3/uL (ref 0.0–0.1)
EOS%: 8.2 % — AB (ref 0.0–7.0)
Eosinophils Absolute: 0.2 10*3/uL (ref 0.0–0.5)
HEMATOCRIT: 37.6 % (ref 34.8–46.6)
HEMOGLOBIN: 11.2 g/dL — AB (ref 11.6–15.9)
IMMATURE RETIC FRACT: 9.2 % (ref 1.60–10.00)
LYMPH%: 36.6 % (ref 14.0–49.7)
MCH: 20.6 pg — AB (ref 25.1–34.0)
MCHC: 29.8 g/dL — ABNORMAL LOW (ref 31.5–36.0)
MCV: 69.1 fL — AB (ref 79.5–101.0)
MONO#: 0.4 10*3/uL (ref 0.1–0.9)
MONO%: 14 % (ref 0.0–14.0)
NEUT#: 1 10*3/uL — ABNORMAL LOW (ref 1.5–6.5)
NEUT%: 39.6 % (ref 38.4–76.8)
Platelets: 257 10*3/uL (ref 145–400)
RBC: 5.44 10*6/uL (ref 3.70–5.45)
RDW: 21.2 % — ABNORMAL HIGH (ref 11.2–14.5)
RETIC %: 0.63 % — AB (ref 0.70–2.10)
Retic Ct Abs: 34.27 10*3/uL (ref 33.70–90.70)
WBC: 2.6 10*3/uL — ABNORMAL LOW (ref 3.9–10.3)
lymph#: 0.9 10*3/uL (ref 0.9–3.3)
nRBC: 0 % (ref 0–0)

## 2016-08-01 LAB — TECHNOLOGIST REVIEW

## 2016-08-01 LAB — FERRITIN: Ferritin: 8 ng/ml — ABNORMAL LOW (ref 9–269)

## 2016-08-01 NOTE — Telephone Encounter (Signed)
-----   Message from Heath Lark, MD sent at 08/01/2016 11:09 AM EDT ----- Regarding: anemia pls remind her to keep appt next week for IV iron Labs showed she is anemic ----- Message ----- From: Interface, Lab In Three Zero One Sent: 08/01/2016   8:56 AM To: Heath Lark, MD

## 2016-08-01 NOTE — Telephone Encounter (Signed)
As noted below by Dr. Alvy Bimler, I instructed patient to keep her appointment for IV iron next week due to her labs reflecting that she is anemic. Patient verbalized understanding.

## 2016-08-08 ENCOUNTER — Encounter: Payer: Self-pay | Admitting: Hematology and Oncology

## 2016-08-08 ENCOUNTER — Telehealth: Payer: Self-pay | Admitting: Hematology and Oncology

## 2016-08-08 ENCOUNTER — Ambulatory Visit (HOSPITAL_BASED_OUTPATIENT_CLINIC_OR_DEPARTMENT_OTHER): Payer: BLUE CROSS/BLUE SHIELD

## 2016-08-08 ENCOUNTER — Ambulatory Visit (HOSPITAL_BASED_OUTPATIENT_CLINIC_OR_DEPARTMENT_OTHER): Payer: BLUE CROSS/BLUE SHIELD | Admitting: Hematology and Oncology

## 2016-08-08 VITALS — BP 141/70 | HR 73 | Temp 99.0°F | Resp 18

## 2016-08-08 VITALS — BP 133/64 | HR 75 | Temp 98.0°F | Resp 20 | Ht 68.5 in | Wt 209.9 lb

## 2016-08-08 DIAGNOSIS — D500 Iron deficiency anemia secondary to blood loss (chronic): Secondary | ICD-10-CM

## 2016-08-08 DIAGNOSIS — D72819 Decreased white blood cell count, unspecified: Secondary | ICD-10-CM | POA: Diagnosis not present

## 2016-08-08 DIAGNOSIS — E559 Vitamin D deficiency, unspecified: Secondary | ICD-10-CM | POA: Diagnosis not present

## 2016-08-08 DIAGNOSIS — Z9884 Bariatric surgery status: Secondary | ICD-10-CM

## 2016-08-08 DIAGNOSIS — E538 Deficiency of other specified B group vitamins: Secondary | ICD-10-CM | POA: Insufficient documentation

## 2016-08-08 DIAGNOSIS — D72818 Other decreased white blood cell count: Secondary | ICD-10-CM

## 2016-08-08 MED ORDER — SODIUM CHLORIDE 0.9 % IV SOLN
Freq: Once | INTRAVENOUS | Status: AC
  Administered 2016-08-08: 10:00:00 via INTRAVENOUS

## 2016-08-08 MED ORDER — SODIUM CHLORIDE 0.9 % IV SOLN
510.0000 mg | Freq: Once | INTRAVENOUS | Status: AC
  Administered 2016-08-08: 510 mg via INTRAVENOUS
  Filled 2016-08-08: qty 17

## 2016-08-08 NOTE — Patient Instructions (Signed)

## 2016-08-08 NOTE — Assessment & Plan Note (Addendum)
The cause is unknown.  With patients who have undergone bariatric surgery, I am concerned about possible B12 deficiency or copper deficiency I plan to repeat the set of blood work next month She is not symptomatic.  She does not need antibiotic therapy

## 2016-08-08 NOTE — Progress Notes (Signed)
[facility] OFFICE PROGRESS NOTE  [name], MD SUMMARY OF HEMATOLOGIC HISTORY:  Cynthia Vazquez was found to have abnormal CBC from routine blood work. In November 2008, Cynthia Vazquez has anemia with hemoglobin of 9.7, MCV of 58.4 with platelet count elevation at 469. On 07/13/2013, Cynthia Vazquez is still anemic with hemoglobin 9.1, MCV of 60.7 and elevated platelet count of 434. Iron studies performed in her physician's office was very low. Cynthia Vazquez denies recent chest pain on exertion, shortness of breath on minimal exertion, or palpitations. Cynthia Vazquez did complain of profound weakness, dizziness as well as new onset of leg cramps. Cynthia Vazquez had not noticed any recent bleeding such as epistaxis, hematuria or hematochezia. Cynthia Vazquez does complain of excessive menstruation. Cynthia Vazquez has periods 7-10 days every month with significant heavy periods in the first few days of each menstrual cycle. On 07/15/2013 and 09/16/2013, Cynthia Vazquez received intravenous iron. On 05/09/2014-05/16/2014, Cynthia Vazquez received IV Feraheme On 11/07/2014, Cynthia Vazquez underwent laparoscopic sleeve gastrectomy with hiatal hernia repair. Cynthia Vazquez received IV iron in October 2016 and in 2017 INTERVAL HISTORY: Cynthia Vazquez 46 y.o. female returns for further follow-up. Cynthia Vazquez complain of excessive fatigue Cynthia Vazquez is undergoing a lot of stress and recently changed job Cynthia Vazquez had to surgery recently Cynthia Vazquez has somewhat heavy menstruation The patient denies any recent signs or symptoms of bleeding such as spontaneous epistaxis, hematuria or hematochezia. Cynthia Vazquez denies recent infection  I have reviewed the past medical history, past surgical history, social history and family history with the patient and they are unchanged from previous note.  ALLERGIES:  is allergic to shellfish allergy and augmentin [amoxicillin-pot clavulanate].  MEDICATIONS:  Current Outpatient Prescriptions  Medication Sig Dispense Refill  . Calcium Carb-Cholecalciferol (CALCIUM 600 + D PO) Take 1 tablet by mouth 3 (three) times  daily.    . Cyanocobalamin (B-12) 3000 MCG CAPS Take 1 capsule by mouth once a week. Wednesday    . fluocinonide (LIDEX) 0.05 % external solution Apply 1 application topically 2 (two) times daily as needed (for itchy scalp).   0  . ibuprofen (ADVIL,MOTRIN) 600 MG tablet Take 1 tablet (600 mg total) by mouth every 6 (six) hours as needed. 30 tablet 0  . levothyroxine (SYNTHROID, LEVOTHROID) 50 MCG tablet Take 50 mcg by mouth daily before breakfast.    . loratadine (CLARITIN) 10 MG tablet Take 10 mg by mouth daily as needed for allergies.     . Multiple Vitamins-Minerals (OPTISOURCE POST BARIATRIC SURG) CHEW Chew 2 tablets by mouth 2 (two) times daily.    . fluticasone (FLONASE) 50 MCG/ACT nasal spray Place 2 sprays into both nostrils daily. (Patient not taking: Reported on 08/08/2016) 16 g 0  . NEXIUM 40 MG capsule Take 40 mg by mouth daily as needed (indigestion.).   0  . oxyCODONE (ROXICODONE) 5 MG/5ML solution Take 5-10 mg by mouth every 4 (four) hours as needed for moderate pain or severe pain.    . traMADol (ULTRAM) 50 MG tablet Take 1 tablet (50 mg total) by mouth every 6 (six) hours as needed. (Patient not taking: Reported on 08/08/2016) 15 tablet 0   No current facility-administered medications for this visit.      REVIEW OF SYSTEMS:   Constitutional: Denies fevers, chills or night sweats Eyes: Denies blurriness of vision Ears, nose, mouth, throat, and face: Denies mucositis or sore throat Respiratory: Denies cough, dyspnea or wheezes Cardiovascular: Denies palpitation, chest discomfort or lower extremity swelling Gastrointestinal:  Denies nausea, heartburn or change in bowel habits Skin: Denies abnormal skin rashes  Lymphatics: Denies new lymphadenopathy or easy bruising Neurological:Denies numbness, tingling or new weaknesses Behavioral/Psych: Mood is stable, no new changes  All other systems were reviewed with the patient and are negative.  PHYSICAL EXAMINATION: ECOG PERFORMANCE  STATUS: 1 - Symptomatic but completely ambulatory  Vitals:   08/08/16 0823  BP: 133/64  Pulse: 75  Resp: 20  Temp: 98 F (36.7 C)   Filed Weights   08/08/16 0823  Weight: 209 lb 14.4 oz (95.2 kg)    GENERAL:alert, no distress and comfortable SKIN: skin color, texture, turgor are normal, no rashes or significant lesions EYES: normal, Conjunctiva are pink and non-injected, sclera clear OROPHARYNX:no exudate, no erythema and lips, buccal mucosa, and tongue normal  NECK: supple, thyroid normal size, non-tender, without nodularity LYMPH:  no palpable lymphadenopathy in the cervical, axillary or inguinal LUNGS: clear to auscultation and percussion with normal breathing effort HEART: regular rate & rhythm and no murmurs and no lower extremity edema ABDOMEN:abdomen soft, non-tender and normal bowel sounds Musculoskeletal:no cyanosis of digits and no clubbing  NEURO: alert & oriented x 3 with fluent speech, no focal motor/sensory deficits  LABORATORY DATA:  I have reviewed the data as listed     Component Value Date/Time   NA 134 (L) 05/01/2015 1452   K 3.6 05/01/2015 1452   CL 103 05/01/2015 1452   CO2 23 05/01/2015 1452   GLUCOSE 94 05/01/2015 1452   BUN 14 05/01/2015 1452   CREATININE 0.64 05/01/2015 1452   CALCIUM 9.1 05/01/2015 1452   PROT 7.3 11/08/2014 0555   ALBUMIN 3.5 11/08/2014 0555   AST 74 (H) 11/08/2014 0555   ALT 57 (H) 11/08/2014 0555   ALKPHOS 74 11/08/2014 0555   BILITOT 0.4 11/08/2014 0555   GFRNONAA >60 05/01/2015 1452   GFRAA >60 05/01/2015 1452    No results found for: SPEP, UPEP  Lab Results  Component Value Date   WBC 2.6 (L) 08/01/2016   NEUTROABS 1.0 (L) 08/01/2016   HGB 11.2 (L) 08/01/2016   HCT 37.6 08/01/2016   MCV 69.1 (L) 08/01/2016   PLT 257 08/01/2016      Chemistry      Component Value Date/Time   NA 134 (L) 05/01/2015 1452   K 3.6 05/01/2015 1452   CL 103 05/01/2015 1452   CO2 23 05/01/2015 1452   BUN 14 05/01/2015 1452    CREATININE 0.64 05/01/2015 1452      Component Value Date/Time   CALCIUM 9.1 05/01/2015 1452   ALKPHOS 74 11/08/2014 0555   AST 74 (H) 11/08/2014 0555   ALT 57 (H) 11/08/2014 0555   BILITOT 0.4 11/08/2014 0555      ASSESSMENT & PLAN:  Iron deficiency anemia due to chronic blood loss The most likely cause of her anemia is due to chronic blood loss/malabsorption syndrome. We discussed some of the risks, benefits, and alternatives of intravenous iron infusions. The patient is symptomatic from anemia and the iron level is critically low. Cynthia Vazquez tolerated oral iron supplement poorly and desires to achieved higher levels of iron faster for adequate hematopoesis. Some of the side-effects to be expected including risks of infusion reactions, phlebitis, headaches, nausea and fatigue.  The patient is willing to proceed. Patient education material was dispensed.  Goal is to keep ferritin level greater than 50 I plan to repeat iron studies again in 1 month  Leukopenia The cause is unknown.  With patients who have undergone bariatric surgery, I am concerned about possible B12 deficiency or copper deficiency  I plan to repeat the set of blood work next month Cynthia Vazquez is not symptomatic.  Cynthia Vazquez does not need antibiotic therapy  S/P laparoscopic sleeve gastrectomy The patient is at risk for multiple mineral deficiency secondary to bariatric surgery. I recommend high-dose vitamin D replacement therapy along with oral vitamin B-12 supplement  Vitamin D deficiency Cynthia Vazquez is going to be at high risk for vitamin D deficiency Plan to repeat blood test next week   Orders Placed This Encounter  Procedures  . CBC & Diff and Retic    Standing Status:   Future    Standing Expiration Date:   09/12/2017  . Ferritin    Standing Status:   Future    Standing Expiration Date:   09/12/2017  . Vitamin B12    Standing Status:   Future    Standing Expiration Date:   09/12/2017  . Iron and TIBC    Standing Status:   Future     Standing Expiration Date:   09/12/2017  . Vitamin D 25 hydroxy    Standing Status:   Future    Standing Expiration Date:   09/12/2017  . Copper, serum    Standing Status:   Future    Standing Expiration Date:   09/12/2017    All questions were answered. The patient knows to call the clinic with any problems, questions or concerns. No barriers to learning was detected.  I spent 15 minutes counseling the patient face to face. The total time spent in the appointment was 20 minutes and more than 50% was on counseling.     Heath Lark, MD 6/14/20188:47 AM

## 2016-08-08 NOTE — Assessment & Plan Note (Signed)
The most likely cause of her anemia is due to chronic blood loss/malabsorption syndrome. We discussed some of the risks, benefits, and alternatives of intravenous iron infusions. The patient is symptomatic from anemia and the iron level is critically low. She tolerated oral iron supplement poorly and desires to achieved higher levels of iron faster for adequate hematopoesis. Some of the side-effects to be expected including risks of infusion reactions, phlebitis, headaches, nausea and fatigue.  The patient is willing to proceed. Patient education material was dispensed.  Goal is to keep ferritin level greater than 50 I plan to repeat iron studies again in 1 month

## 2016-08-08 NOTE — Telephone Encounter (Signed)
Scheduled appt per 6/14 los - Gave patient AVS and calender.  

## 2016-08-08 NOTE — Assessment & Plan Note (Signed)
She is going to be at high risk for vitamin D deficiency Plan to repeat blood test next week

## 2016-08-08 NOTE — Assessment & Plan Note (Signed)
The patient is at risk for multiple mineral deficiency secondary to bariatric surgery. I recommend high-dose vitamin D replacement therapy along with oral vitamin B-12 supplement

## 2016-09-05 ENCOUNTER — Other Ambulatory Visit (HOSPITAL_BASED_OUTPATIENT_CLINIC_OR_DEPARTMENT_OTHER): Payer: BLUE CROSS/BLUE SHIELD

## 2016-09-05 DIAGNOSIS — E538 Deficiency of other specified B group vitamins: Secondary | ICD-10-CM | POA: Diagnosis not present

## 2016-09-05 DIAGNOSIS — E559 Vitamin D deficiency, unspecified: Secondary | ICD-10-CM | POA: Diagnosis not present

## 2016-09-05 DIAGNOSIS — D500 Iron deficiency anemia secondary to blood loss (chronic): Secondary | ICD-10-CM

## 2016-09-05 DIAGNOSIS — D72819 Decreased white blood cell count, unspecified: Secondary | ICD-10-CM | POA: Diagnosis not present

## 2016-09-05 DIAGNOSIS — D72818 Other decreased white blood cell count: Secondary | ICD-10-CM

## 2016-09-05 DIAGNOSIS — Z9884 Bariatric surgery status: Secondary | ICD-10-CM | POA: Diagnosis not present

## 2016-09-05 LAB — CBC & DIFF AND RETIC
BASO%: 0.9 % (ref 0.0–2.0)
BASOS ABS: 0 10*3/uL (ref 0.0–0.1)
EOS%: 4.6 % (ref 0.0–7.0)
Eosinophils Absolute: 0.2 10*3/uL (ref 0.0–0.5)
HEMATOCRIT: 42.4 % (ref 34.8–46.6)
HGB: 12.7 g/dL (ref 11.6–15.9)
IMMATURE RETIC FRACT: 12.2 % — AB (ref 1.60–10.00)
LYMPH#: 1.4 10*3/uL (ref 0.9–3.3)
LYMPH%: 41.3 % (ref 14.0–49.7)
MCH: 22.6 pg — ABNORMAL LOW (ref 25.1–34.0)
MCHC: 30 g/dL — AB (ref 31.5–36.0)
MCV: 75.4 fL — ABNORMAL LOW (ref 79.5–101.0)
MONO#: 0.3 10*3/uL (ref 0.1–0.9)
MONO%: 9.2 % (ref 0.0–14.0)
NEUT#: 1.4 10*3/uL — ABNORMAL LOW (ref 1.5–6.5)
NEUT%: 44 % (ref 38.4–76.8)
PLATELETS: 194 10*3/uL (ref 145–400)
RBC: 5.62 10*6/uL — AB (ref 3.70–5.45)
RDW: 23.1 % — AB (ref 11.2–14.5)
RETIC %: 0.95 % (ref 0.70–2.10)
RETIC CT ABS: 53.39 10*3/uL (ref 33.70–90.70)
WBC: 3.3 10*3/uL — ABNORMAL LOW (ref 3.9–10.3)

## 2016-09-05 LAB — IRON AND TIBC
%SAT: 21 % (ref 21–57)
IRON: 72 ug/dL (ref 41–142)
TIBC: 339 ug/dL (ref 236–444)
UIBC: 267 ug/dL (ref 120–384)

## 2016-09-05 LAB — FERRITIN: Ferritin: 53 ng/ml (ref 9–269)

## 2016-09-06 ENCOUNTER — Other Ambulatory Visit: Payer: Self-pay | Admitting: Hematology and Oncology

## 2016-09-06 ENCOUNTER — Telehealth: Payer: Self-pay | Admitting: *Deleted

## 2016-09-06 DIAGNOSIS — D500 Iron deficiency anemia secondary to blood loss (chronic): Secondary | ICD-10-CM

## 2016-09-06 LAB — VITAMIN D 25 HYDROXY (VIT D DEFICIENCY, FRACTURES): Vitamin D, 25-Hydroxy: 36.6 ng/mL (ref 30.0–100.0)

## 2016-09-06 LAB — VITAMIN B12

## 2016-09-06 LAB — COPPER, SERUM: Copper: 94 ug/dL (ref 72–166)

## 2016-09-06 NOTE — Telephone Encounter (Signed)
-----   Message from Heath Lark, MD sent at 09/06/2016  1:43 PM EDT ----- Regarding: labs are good pls let her know all labs are better Recommend recheck at the end of the year, here or with PCP If here, please send scheduling for labs only in 4 months ----- Message ----- From: Interface, Lab In Three Zero One Sent: 09/05/2016   8:22 AM To: Heath Lark, MD

## 2016-09-06 NOTE — Telephone Encounter (Signed)
LM to call Dr Alvy Bimler office regarding appts

## 2017-01-08 ENCOUNTER — Other Ambulatory Visit (HOSPITAL_BASED_OUTPATIENT_CLINIC_OR_DEPARTMENT_OTHER): Payer: BLUE CROSS/BLUE SHIELD

## 2017-01-08 ENCOUNTER — Other Ambulatory Visit: Payer: BLUE CROSS/BLUE SHIELD

## 2017-01-08 DIAGNOSIS — D72819 Decreased white blood cell count, unspecified: Secondary | ICD-10-CM | POA: Diagnosis not present

## 2017-01-08 DIAGNOSIS — E559 Vitamin D deficiency, unspecified: Secondary | ICD-10-CM | POA: Diagnosis not present

## 2017-01-08 DIAGNOSIS — D500 Iron deficiency anemia secondary to blood loss (chronic): Secondary | ICD-10-CM

## 2017-01-08 LAB — CBC WITH DIFFERENTIAL/PLATELET
BASO%: 1 % (ref 0.0–2.0)
Basophils Absolute: 0 10*3/uL (ref 0.0–0.1)
EOS ABS: 0.1 10*3/uL (ref 0.0–0.5)
EOS%: 4.2 % (ref 0.0–7.0)
HEMATOCRIT: 35.5 % (ref 34.8–46.6)
HGB: 10.5 g/dL — ABNORMAL LOW (ref 11.6–15.9)
LYMPH#: 1.4 10*3/uL (ref 0.9–3.3)
LYMPH%: 45 % (ref 14.0–49.7)
MCH: 22.7 pg — ABNORMAL LOW (ref 25.1–34.0)
MCHC: 29.6 g/dL — AB (ref 31.5–36.0)
MCV: 76.7 fL — AB (ref 79.5–101.0)
MONO#: 0.4 10*3/uL (ref 0.1–0.9)
MONO%: 11.5 % (ref 0.0–14.0)
NEUT%: 38.3 % — AB (ref 38.4–76.8)
NEUTROS ABS: 1.2 10*3/uL — AB (ref 1.5–6.5)
PLATELETS: 254 10*3/uL (ref 145–400)
RBC: 4.63 10*6/uL (ref 3.70–5.45)
RDW: 16.2 % — ABNORMAL HIGH (ref 11.2–14.5)
WBC: 3.1 10*3/uL — ABNORMAL LOW (ref 3.9–10.3)

## 2017-01-08 LAB — IRON AND TIBC
%SAT: 8 % — ABNORMAL LOW (ref 21–57)
IRON: 28 ug/dL — AB (ref 41–142)
TIBC: 363 ug/dL (ref 236–444)
UIBC: 335 ug/dL (ref 120–384)

## 2017-01-08 LAB — FERRITIN: Ferritin: 7 ng/ml — ABNORMAL LOW (ref 9–269)

## 2017-01-09 ENCOUNTER — Telehealth: Payer: Self-pay

## 2017-01-09 ENCOUNTER — Other Ambulatory Visit: Payer: Self-pay | Admitting: Hematology and Oncology

## 2017-01-09 NOTE — Telephone Encounter (Signed)
Called with below message. She does not want to get IV iron at home. She would like to come into the infusion room for IV iron. She is off work 12/6 if that helps, she will come in whatever day is available.

## 2017-01-09 NOTE — Telephone Encounter (Signed)
I can give her 1 dose on 11/26 and another dose on 12/6 I will see her on 11/26 in the morning I will place scheduling msg

## 2017-01-09 NOTE — Telephone Encounter (Signed)
-----   Message from Heath Lark, MD sent at 01/08/2017 10:00 AM EST ----- Regarding: labs Pls call her. She needs IV iron again. Due to infusion room overbooked, would she be willing to consider IV iron at home? I can talk to her about this. Can she see me on Friday at 11 am? If so, please send scheduling msg ----- Message ----- From: Interface, Lab In Three Zero One Sent: 01/08/2017   8:46 AM To: Heath Lark, MD

## 2017-01-12 ENCOUNTER — Telehealth: Payer: Self-pay

## 2017-01-12 NOTE — Telephone Encounter (Signed)
Called and left a message with appts per inbasket

## 2017-01-20 ENCOUNTER — Ambulatory Visit (HOSPITAL_BASED_OUTPATIENT_CLINIC_OR_DEPARTMENT_OTHER): Payer: BLUE CROSS/BLUE SHIELD | Admitting: Hematology and Oncology

## 2017-01-20 ENCOUNTER — Telehealth: Payer: Self-pay | Admitting: Hematology and Oncology

## 2017-01-20 ENCOUNTER — Ambulatory Visit (HOSPITAL_BASED_OUTPATIENT_CLINIC_OR_DEPARTMENT_OTHER): Payer: BLUE CROSS/BLUE SHIELD

## 2017-01-20 VITALS — BP 153/89 | HR 63 | Temp 99.1°F | Resp 18 | Ht 68.5 in

## 2017-01-20 VITALS — BP 145/77 | HR 57 | Temp 98.4°F | Resp 16

## 2017-01-20 DIAGNOSIS — R5383 Other fatigue: Secondary | ICD-10-CM | POA: Diagnosis not present

## 2017-01-20 DIAGNOSIS — D500 Iron deficiency anemia secondary to blood loss (chronic): Secondary | ICD-10-CM

## 2017-01-20 DIAGNOSIS — N920 Excessive and frequent menstruation with regular cycle: Secondary | ICD-10-CM

## 2017-01-20 DIAGNOSIS — E538 Deficiency of other specified B group vitamins: Secondary | ICD-10-CM

## 2017-01-20 DIAGNOSIS — D72818 Other decreased white blood cell count: Secondary | ICD-10-CM | POA: Diagnosis not present

## 2017-01-20 DIAGNOSIS — N921 Excessive and frequent menstruation with irregular cycle: Secondary | ICD-10-CM

## 2017-01-20 DIAGNOSIS — E559 Vitamin D deficiency, unspecified: Secondary | ICD-10-CM | POA: Diagnosis not present

## 2017-01-20 MED ORDER — SODIUM CHLORIDE 0.9 % IV SOLN
510.0000 mg | Freq: Once | INTRAVENOUS | Status: AC
  Administered 2017-01-20: 510 mg via INTRAVENOUS
  Filled 2017-01-20: qty 17

## 2017-01-20 MED ORDER — SODIUM CHLORIDE 0.9 % IV SOLN
Freq: Once | INTRAVENOUS | Status: AC
  Administered 2017-01-20: 10:00:00 via INTRAVENOUS

## 2017-01-20 NOTE — Telephone Encounter (Signed)
Gave avs and calendar for February 2019 °

## 2017-01-20 NOTE — Patient Instructions (Signed)

## 2017-01-21 ENCOUNTER — Encounter: Payer: Self-pay | Admitting: Hematology and Oncology

## 2017-01-21 DIAGNOSIS — R5383 Other fatigue: Secondary | ICD-10-CM | POA: Insufficient documentation

## 2017-01-21 NOTE — Assessment & Plan Note (Signed)
She is going to be at high risk for vitamin D deficiency Plan to repeat blood test next visit

## 2017-01-21 NOTE — Assessment & Plan Note (Signed)
The cause is unknown.  She is not symptomatic.  She does not need antibiotic therapy Prior evaluation including serum vitamin B12, copper and others within normal limits

## 2017-01-21 NOTE — Assessment & Plan Note (Signed)
She is symptomatic with excessive fatigue I plan to order TSH in her next visit

## 2017-01-21 NOTE — Progress Notes (Signed)
[facility] OFFICE PROGRESS NOTE  [name], MD SUMMARY OF HEMATOLOGIC HISTORY:  She was found to have abnormal CBC from routine blood work. In November 2008, she has anemia with hemoglobin of 9.7, MCV of 58.4 with platelet count elevation at 469. On 07/13/2013, she is still anemic with hemoglobin 9.1, MCV of 60.7 and elevated platelet count of 434. Iron studies performed in her physician's office was very low. She denies recent chest pain on exertion, shortness of breath on minimal exertion, or palpitations. She did complain of profound weakness, dizziness as well as new onset of leg cramps. She had not noticed any recent bleeding such as epistaxis, hematuria or hematochezia. She does complain of excessive menstruation. She has periods 7-10 days every month with significant heavy periods in the first few days of each menstrual cycle. On 07/15/2013 and 09/16/2013, she received intravenous iron. On 05/09/2014-05/16/2014, she received IV Feraheme On 11/07/2014, she underwent laparoscopic sleeve gastrectomy with hiatal hernia repair. She received IV iron intermittently INTERVAL HISTORY: Cynthia Vazquez 46 y.o. female returns for further follow-up. She complained of fatigue, feeling depressed and sleepy all the time She is compliant taking most of her nutritional supplement as indicated The patient denies any recent signs or symptoms of bleeding such as spontaneous epistaxis, hematuria or hematochezia.  I have reviewed the past medical history, past surgical history, social history and family history with the patient and they are unchanged from previous note.  ALLERGIES:  is allergic to shellfish allergy and augmentin [amoxicillin-pot clavulanate].  MEDICATIONS:  Current Outpatient Medications  Medication Sig Dispense Refill  . Calcium Carb-Cholecalciferol (CALCIUM 600 + D PO) Take 1 tablet by mouth 3 (three) times daily.    . Cyanocobalamin (B-12) 3000 MCG CAPS Take 1 capsule  by mouth once a week. Wednesday    . fluocinonide (LIDEX) 0.05 % external solution Apply 1 application topically 2 (two) times daily as needed (for itchy scalp).   0  . fluticasone (FLONASE) 50 MCG/ACT nasal spray Place 2 sprays into both nostrils daily. (Patient not taking: Reported on 08/08/2016) 16 g 0  . ibuprofen (ADVIL,MOTRIN) 600 MG tablet Take 1 tablet (600 mg total) by mouth every 6 (six) hours as needed. 30 tablet 0  . levothyroxine (SYNTHROID, LEVOTHROID) 50 MCG tablet Take 50 mcg by mouth daily before breakfast.    . loratadine (CLARITIN) 10 MG tablet Take 10 mg by mouth daily as needed for allergies.     . Multiple Vitamins-Minerals (OPTISOURCE POST BARIATRIC SURG) CHEW Chew 2 tablets by mouth 2 (two) times daily.    Marland Kitchen NEXIUM 40 MG capsule Take 40 mg by mouth daily as needed (indigestion.).   0  . oxyCODONE (ROXICODONE) 5 MG/5ML solution Take 5-10 mg by mouth every 4 (four) hours as needed for moderate pain or severe pain.    . traMADol (ULTRAM) 50 MG tablet Take 1 tablet (50 mg total) by mouth every 6 (six) hours as needed. (Patient not taking: Reported on 08/08/2016) 15 tablet 0   No current facility-administered medications for this visit.      REVIEW OF SYSTEMS:   Constitutional: Denies fevers, chills or night sweats Eyes: Denies blurriness of vision Ears, nose, mouth, throat, and face: Denies mucositis or sore throat Respiratory: Denies cough, dyspnea or wheezes Cardiovascular: Denies palpitation, chest discomfort or lower extremity swelling Gastrointestinal:  Denies nausea, heartburn or change in bowel habits Skin: Denies abnormal skin rashes Lymphatics: Denies new lymphadenopathy or easy bruising Neurological:Denies numbness, tingling or new weaknesses  All other systems were reviewed with the patient and are negative.  PHYSICAL EXAMINATION: ECOG PERFORMANCE STATUS: 1 - Symptomatic but completely ambulatory  Vitals:   01/20/17 0848  BP: (!) 153/89  Pulse: 63  Resp:  18  Temp: 99.1 F (37.3 C)  SpO2: 100%   Filed Weights    GENERAL:alert, no distress and comfortable SKIN: skin color, texture, turgor are normal, no rashes or significant lesions EYES: normal, Conjunctiva are pink and non-injected, sclera clear OROPHARYNX:no exudate, no erythema and lips, buccal mucosa, and tongue normal  NECK: supple, thyroid normal size, non-tender, without nodularity LYMPH:  no palpable lymphadenopathy in the cervical, axillary or inguinal LUNGS: clear to auscultation and percussion with normal breathing effort HEART: regular rate & rhythm and no murmurs and no lower extremity edema ABDOMEN:abdomen soft, non-tender and normal bowel sounds Musculoskeletal:no cyanosis of digits and no clubbing  NEURO: alert & oriented x 3 with fluent speech, no focal motor/sensory deficits  LABORATORY DATA:  I have reviewed the data as listed     Component Value Date/Time   NA 134 (L) 05/01/2015 1452   K 3.6 05/01/2015 1452   CL 103 05/01/2015 1452   CO2 23 05/01/2015 1452   GLUCOSE 94 05/01/2015 1452   BUN 14 05/01/2015 1452   CREATININE 0.64 05/01/2015 1452   CALCIUM 9.1 05/01/2015 1452   PROT 7.3 11/08/2014 0555   ALBUMIN 3.5 11/08/2014 0555   AST 74 (H) 11/08/2014 0555   ALT 57 (H) 11/08/2014 0555   ALKPHOS 74 11/08/2014 0555   BILITOT 0.4 11/08/2014 0555   GFRNONAA >60 05/01/2015 1452   GFRAA >60 05/01/2015 1452    No results found for: SPEP, UPEP  Lab Results  Component Value Date   WBC 3.1 (L) 01/08/2017   NEUTROABS 1.2 (L) 01/08/2017   HGB 10.5 (L) 01/08/2017   HCT 35.5 01/08/2017   MCV 76.7 (L) 01/08/2017   PLT 254 01/08/2017      Chemistry      Component Value Date/Time   NA 134 (L) 05/01/2015 1452   K 3.6 05/01/2015 1452   CL 103 05/01/2015 1452   CO2 23 05/01/2015 1452   BUN 14 05/01/2015 1452   CREATININE 0.64 05/01/2015 1452      Component Value Date/Time   CALCIUM 9.1 05/01/2015 1452   ALKPHOS 74 11/08/2014 0555   AST 74 (H)  11/08/2014 0555   ALT 57 (H) 11/08/2014 0555   BILITOT 0.4 11/08/2014 0555      ASSESSMENT & PLAN:  Iron deficiency anemia due to chronic blood loss The patient is not sure she wants to proceed with intravenous iron I strongly recommend she continues treatment as scheduled because her symptoms of feeling depressed could very well be related to lack of energy and fatigue from severe iron deficiency anemia After significant discussion, she is in agreement to proceed The most likely cause of her anemia is due to chronic blood loss/malabsorption syndrome. We discussed some of the risks, benefits, and alternatives of intravenous iron infusions. The patient is symptomatic from anemia and the iron level is critically low. She tolerated oral iron supplement poorly and desires to achieved higher levels of iron faster for adequate hematopoesis. Some of the side-effects to be expected including risks of infusion reactions, phlebitis, headaches, nausea and fatigue.  The patient is willing to proceed. Patient education material was dispensed.  Goal is to keep ferritin level greater than 50 If she continues to have recurrent, profound, recurrent iron deficiency anemia, we  will have to refer her to GI for endoscopy evaluation   Leukopenia The cause is unknown.  She is not symptomatic.  She does not need antibiotic therapy Prior evaluation including serum vitamin B12, copper and others within normal limits  Vitamin D deficiency She is going to be at high risk for vitamin D deficiency Plan to repeat blood test next visit  Other fatigue She is symptomatic with excessive fatigue I plan to order TSH in her next visit  Menorrhagia She complained of intermittent menorrhagia I recommend she consult with her gynecologist for further management.   Orders Placed This Encounter  Procedures  . TSH    Standing Status:   Future    Standing Expiration Date:   02/25/2018  . Vitamin D 25 hydroxy    Standing  Status:   Future    Standing Expiration Date:   01/21/2018  . Vitamin B12    Standing Status:   Future    Standing Expiration Date:   02/25/2018  . Ferritin    Standing Status:   Future    Standing Expiration Date:   02/25/2018  . Iron and TIBC    Standing Status:   Future    Standing Expiration Date:   02/25/2018  . CBC & Diff and Retic    Standing Status:   Future    Standing Expiration Date:   02/25/2018    All questions were answered. The patient knows to call the clinic with any problems, questions or concerns. No barriers to learning was detected.  I spent 15 minutes counseling the patient face to face. The total time spent in the appointment was 20 minutes and more than 50% was on counseling.     Heath Lark, MD 11/27/20185:16 PM

## 2017-01-21 NOTE — Assessment & Plan Note (Addendum)
The patient is not sure she wants to proceed with intravenous iron I strongly recommend she continues treatment as scheduled because her symptoms of feeling depressed could very well be related to lack of energy and fatigue from severe iron deficiency anemia After significant discussion, she is in agreement to proceed The most likely cause of her anemia is due to chronic blood loss/malabsorption syndrome. We discussed some of the risks, benefits, and alternatives of intravenous iron infusions. The patient is symptomatic from anemia and the iron level is critically low. She tolerated oral iron supplement poorly and desires to achieved higher levels of iron faster for adequate hematopoesis. Some of the side-effects to be expected including risks of infusion reactions, phlebitis, headaches, nausea and fatigue.  The patient is willing to proceed. Patient education material was dispensed.  Goal is to keep ferritin level greater than 50 If she continues to have recurrent, profound, recurrent iron deficiency anemia, we will have to refer her to GI for endoscopy evaluation

## 2017-01-21 NOTE — Assessment & Plan Note (Signed)
She complained of intermittent menorrhagia I recommend she consult with her gynecologist for further management.

## 2017-01-22 ENCOUNTER — Other Ambulatory Visit: Payer: Self-pay | Admitting: Family Medicine

## 2017-01-22 DIAGNOSIS — Z1231 Encounter for screening mammogram for malignant neoplasm of breast: Secondary | ICD-10-CM

## 2017-01-30 ENCOUNTER — Ambulatory Visit (HOSPITAL_BASED_OUTPATIENT_CLINIC_OR_DEPARTMENT_OTHER): Payer: BLUE CROSS/BLUE SHIELD

## 2017-01-30 VITALS — BP 135/76 | HR 62 | Temp 98.2°F | Resp 16

## 2017-01-30 DIAGNOSIS — N920 Excessive and frequent menstruation with regular cycle: Secondary | ICD-10-CM | POA: Diagnosis not present

## 2017-01-30 DIAGNOSIS — D500 Iron deficiency anemia secondary to blood loss (chronic): Secondary | ICD-10-CM | POA: Diagnosis not present

## 2017-01-30 DIAGNOSIS — F509 Eating disorder, unspecified: Secondary | ICD-10-CM | POA: Diagnosis not present

## 2017-01-30 MED ORDER — SODIUM CHLORIDE 0.9 % IV SOLN
510.0000 mg | Freq: Once | INTRAVENOUS | Status: AC
  Administered 2017-01-30: 510 mg via INTRAVENOUS
  Filled 2017-01-30: qty 17

## 2017-01-30 NOTE — Patient Instructions (Signed)
Anemia, Nonspecific Anemia is a condition in which the concentration of red blood cells or hemoglobin in the blood is below normal. Hemoglobin is a substance in red blood cells that carries oxygen to the tissues of the body. Anemia results in not enough oxygen reaching these tissues. What are the causes? Common causes of anemia include:  Excessive bleeding. Bleeding may be internal or external. This includes excessive bleeding from periods (in women) or from the intestine.  Poor nutrition.  Chronic kidney, thyroid, and liver disease.  Bone marrow disorders that decrease red blood cell production.  Cancer and treatments for cancer.  HIV, AIDS, and their treatments.  Spleen problems that increase red blood cell destruction.  Blood disorders.  Excess destruction of red blood cells due to infection, medicines, and autoimmune disorders.  What are the signs or symptoms?  Minor weakness.  Dizziness.  Headache.  Palpitations.  Shortness of breath, especially with exercise.  Paleness.  Cold sensitivity.  Indigestion.  Nausea.  Difficulty sleeping.  Difficulty concentrating. Symptoms may occur suddenly or they may develop slowly. How is this diagnosed? Additional blood tests are often needed. These help your health care provider determine the best treatment. Your health care provider will check your stool for blood and look for other causes of blood loss. How is this treated? Treatment varies depending on the cause of the anemia. Treatment can include:  Supplements of iron, vitamin J67, or folic acid.  Hormone medicines.  A blood transfusion. This may be needed if blood loss is severe.  Hospitalization. This may be needed if there is significant continual blood loss.  Dietary changes.  Spleen removal.  Follow these instructions at home: Keep all follow-up appointments. It often takes many weeks to correct anemia, and having your health care provider check on  your condition and your response to treatment is very important. Get help right away if:  You develop extreme weakness, shortness of breath, or chest pain.  You become dizzy or have trouble concentrating.  You develop heavy vaginal bleeding.  You develop a rash.  You have bloody or black, tarry stools.  You faint.  You vomit up blood.  You vomit repeatedly.  You have abdominal pain.  You have a fever or persistent symptoms for more than 2-3 days.  You have a fever and your symptoms suddenly get worse.  You are dehydrated. This information is not intended to replace advice given to you by your health care provider. Make sure you discuss any questions you have with your health care provider. Document Released: 03/21/2004 Document Revised: 07/26/2015 Document Reviewed: 08/07/2012 Elsevier Interactive Patient Education  2017 [employer] Selinda Michaels) What is this medicine? FERUMOXYTOL is an iron complex. Iron is used to make healthy red blood cells, which carry oxygen and nutrients throughout the body. This medicine is used to treat iron deficiency anemia in people with chronic kidney disease. This medicine may be used for other purposes; ask your health care provider or pharmacist if you have questions. COMMON BRAND NAME(S): Feraheme What should I tell my health care provider before I take this medicine? They need to know if you have any of these conditions: -anemia not caused by low iron levels -high levels of iron in the blood -magnetic resonance imaging (MRI) test scheduled -an unusual or allergic reaction to iron, other medicines, foods, dyes, or preservatives -pregnant or trying to get pregnant -breast-feeding How should I use this medicine? This medicine is for injection into a vein. It is  given by a health care professional in a hospital or clinic setting. Talk to your pediatrician regarding the use of this medicine in children. Special  care may be needed. Overdosage: If you think you have taken too much of this medicine contact a poison control center or emergency room at once. NOTE: This medicine is only for you. Do not share this medicine with others. What if I miss a dose? It is important not to miss your dose. Call your doctor or health care professional if you are unable to keep an appointment. What may interact with this medicine? This medicine may interact with the following medications: -other iron products This list may not describe all possible interactions. Give your health care provider a list of all the medicines, herbs, non-prescription drugs, or dietary supplements you use. Also tell them if you smoke, drink alcohol, or use illegal drugs. Some items may interact with your medicine. What should I watch for while using this medicine? Visit your doctor or healthcare professional regularly. Tell your doctor or healthcare professional if your symptoms do not start to get better or if they get worse. You may need blood work done while you are taking this medicine. You may need to follow a special diet. Talk to your doctor. Foods that contain iron include: whole grains/cereals, dried fruits, beans, or peas, leafy green vegetables, and organ meats (liver, kidney). What side effects may I notice from receiving this medicine? Side effects that you should report to your doctor or health care professional as soon as possible: -allergic reactions like skin rash, itching or hives, swelling of the face, lips, or tongue -breathing problems -changes in blood pressure -feeling faint or lightheaded, falls -fever or chills -flushing, sweating, or hot feelings -swelling of the ankles or feet Side effects that usually do not require medical attention (report to your doctor or health care professional if they continue or are bothersome): -diarrhea -headache -nausea, vomiting -stomach pain This list may not describe all possible  side effects. Call your doctor for medical advice about side effects. You may report side effects to FDA at 1-800-FDA-1088. Where should I keep my medicine? This drug is given in a hospital or clinic and will not be stored at home. NOTE: This sheet is a summary. It may not cover all possible information. If you have questions about this medicine, talk to your doctor, pharmacist, or health care provider.  2018 Elsevier/Gold Standard (2015-03-16 12:41:49)

## 2017-02-10 ENCOUNTER — Other Ambulatory Visit (HOSPITAL_COMMUNITY)
Admission: RE | Admit: 2017-02-10 | Discharge: 2017-02-10 | Disposition: A | Discharge status: Home / Self Care | Payer: BLUE CROSS/BLUE SHIELD | Source: Ambulatory Visit | Attending: Family Medicine | Admitting: Family Medicine

## 2017-02-10 ENCOUNTER — Other Ambulatory Visit: Payer: Self-pay | Admitting: Family Medicine

## 2017-02-10 DIAGNOSIS — E7800 Pure hypercholesterolemia, unspecified: Secondary | ICD-10-CM | POA: Diagnosis not present

## 2017-02-10 DIAGNOSIS — Z01411 Encounter for gynecological examination (general) (routine) with abnormal findings: Secondary | ICD-10-CM | POA: Diagnosis not present

## 2017-02-10 DIAGNOSIS — Z79899 Other long term (current) drug therapy: Secondary | ICD-10-CM | POA: Diagnosis not present

## 2017-02-10 DIAGNOSIS — E039 Hypothyroidism, unspecified: Secondary | ICD-10-CM | POA: Diagnosis not present

## 2017-02-10 DIAGNOSIS — E119 Type 2 diabetes mellitus without complications: Secondary | ICD-10-CM | POA: Diagnosis not present

## 2017-02-10 DIAGNOSIS — E559 Vitamin D deficiency, unspecified: Secondary | ICD-10-CM | POA: Diagnosis not present

## 2017-02-10 DIAGNOSIS — Z124 Encounter for screening for malignant neoplasm of cervix: Secondary | ICD-10-CM | POA: Diagnosis not present

## 2017-02-10 DIAGNOSIS — Z0000 Encounter for general adult medical examination without abnormal findings: Secondary | ICD-10-CM | POA: Diagnosis not present

## 2017-02-11 LAB — CYTOLOGY - PAP
DIAGNOSIS: NEGATIVE
HPV: NOT DETECTED

## 2017-02-20 ENCOUNTER — Ambulatory Visit
Admission: RE | Admit: 2017-02-20 | Discharge: 2017-02-20 | Disposition: A | Discharge status: Home / Self Care | Payer: BLUE CROSS/BLUE SHIELD | Source: Ambulatory Visit | Attending: Family Medicine | Admitting: Family Medicine

## 2017-02-20 DIAGNOSIS — Z1231 Encounter for screening mammogram for malignant neoplasm of breast: Secondary | ICD-10-CM

## 2017-04-08 DIAGNOSIS — R635 Abnormal weight gain: Secondary | ICD-10-CM | POA: Diagnosis not present

## 2017-04-08 DIAGNOSIS — N951 Menopausal and female climacteric states: Secondary | ICD-10-CM | POA: Diagnosis not present

## 2017-04-10 DIAGNOSIS — F509 Eating disorder, unspecified: Secondary | ICD-10-CM | POA: Diagnosis not present

## 2017-04-11 DIAGNOSIS — E663 Overweight: Secondary | ICD-10-CM | POA: Diagnosis not present

## 2017-04-11 DIAGNOSIS — R790 Abnormal level of blood mineral: Secondary | ICD-10-CM | POA: Diagnosis not present

## 2017-04-11 DIAGNOSIS — I10 Essential (primary) hypertension: Secondary | ICD-10-CM | POA: Diagnosis not present

## 2017-04-11 DIAGNOSIS — E782 Mixed hyperlipidemia: Secondary | ICD-10-CM | POA: Diagnosis not present

## 2017-04-11 DIAGNOSIS — E039 Hypothyroidism, unspecified: Secondary | ICD-10-CM | POA: Diagnosis not present

## 2017-04-11 DIAGNOSIS — Z9884 Bariatric surgery status: Secondary | ICD-10-CM | POA: Diagnosis not present

## 2017-04-11 DIAGNOSIS — Z862 Personal history of diseases of the blood and blood-forming organs and certain disorders involving the immune mechanism: Secondary | ICD-10-CM | POA: Diagnosis not present

## 2017-04-20 ENCOUNTER — Encounter (HOSPITAL_COMMUNITY): Payer: Self-pay

## 2017-04-20 ENCOUNTER — Other Ambulatory Visit: Payer: Self-pay

## 2017-04-20 ENCOUNTER — Emergency Department (HOSPITAL_COMMUNITY)
Admission: EM | Admit: 2017-04-20 | Discharge: 2017-04-20 | Disposition: A | Discharge status: Home / Self Care | Payer: BLUE CROSS/BLUE SHIELD | Attending: Emergency Medicine | Admitting: Emergency Medicine

## 2017-04-20 DIAGNOSIS — R1013 Epigastric pain: Secondary | ICD-10-CM | POA: Diagnosis not present

## 2017-04-20 DIAGNOSIS — K9049 Malabsorption due to intolerance, not elsewhere classified: Secondary | ICD-10-CM

## 2017-04-20 DIAGNOSIS — E119 Type 2 diabetes mellitus without complications: Secondary | ICD-10-CM | POA: Insufficient documentation

## 2017-04-20 DIAGNOSIS — I10 Essential (primary) hypertension: Secondary | ICD-10-CM | POA: Diagnosis not present

## 2017-04-20 DIAGNOSIS — K909 Intestinal malabsorption, unspecified: Secondary | ICD-10-CM | POA: Insufficient documentation

## 2017-04-20 DIAGNOSIS — R109 Unspecified abdominal pain: Secondary | ICD-10-CM | POA: Diagnosis present

## 2017-04-20 DIAGNOSIS — R197 Diarrhea, unspecified: Secondary | ICD-10-CM | POA: Insufficient documentation

## 2017-04-20 DIAGNOSIS — Z79899 Other long term (current) drug therapy: Secondary | ICD-10-CM | POA: Insufficient documentation

## 2017-04-20 DIAGNOSIS — R140 Abdominal distension (gaseous): Secondary | ICD-10-CM | POA: Insufficient documentation

## 2017-04-20 DIAGNOSIS — Z91011 Allergy to milk products: Secondary | ICD-10-CM | POA: Diagnosis not present

## 2017-04-20 LAB — COMPREHENSIVE METABOLIC PANEL
ALT: 18 U/L (ref 14–54)
AST: 27 U/L (ref 15–41)
Albumin: 3.9 g/dL (ref 3.5–5.0)
Alkaline Phosphatase: 77 U/L (ref 38–126)
Anion gap: 10 (ref 5–15)
BILIRUBIN TOTAL: 0.5 mg/dL (ref 0.3–1.2)
BUN: 16 mg/dL (ref 6–20)
CHLORIDE: 106 mmol/L (ref 101–111)
CO2: 22 mmol/L (ref 22–32)
CREATININE: 0.86 mg/dL (ref 0.44–1.00)
Calcium: 9 mg/dL (ref 8.9–10.3)
Glucose, Bld: 103 mg/dL — ABNORMAL HIGH (ref 65–99)
POTASSIUM: 3.8 mmol/L (ref 3.5–5.1)
Sodium: 138 mmol/L (ref 135–145)
TOTAL PROTEIN: 7.3 g/dL (ref 6.5–8.1)

## 2017-04-20 LAB — CBC
HEMATOCRIT: 39.4 % (ref 36.0–46.0)
Hemoglobin: 12.4 g/dL (ref 12.0–15.0)
MCH: 25.1 pg — ABNORMAL LOW (ref 26.0–34.0)
MCHC: 31.5 g/dL (ref 30.0–36.0)
MCV: 79.8 fL (ref 78.0–100.0)
PLATELETS: 249 10*3/uL (ref 150–400)
RBC: 4.94 MIL/uL (ref 3.87–5.11)
RDW: 16 % — ABNORMAL HIGH (ref 11.5–15.5)
WBC: 3.9 10*3/uL — AB (ref 4.0–10.5)

## 2017-04-20 LAB — LIPASE, BLOOD: LIPASE: 27 U/L (ref 11–51)

## 2017-04-20 LAB — I-STAT BETA HCG BLOOD, ED (MC, WL, AP ONLY): I-stat hCG, quantitative: <5 m[IU]/mL (ref <5)

## 2017-04-20 MED ORDER — GI COCKTAIL ~~LOC~~
30.0000 mL | Freq: Once | ORAL | Status: AC
  Administered 2017-04-20: 30 mL via ORAL
  Filled 2017-04-20: qty 30

## 2017-04-20 MED ORDER — ONDANSETRON 4 MG PO TBDP
4.0000 mg | ORAL_TABLET | Freq: Once | ORAL | Status: AC
  Administered 2017-04-20: 4 mg via ORAL
  Filled 2017-04-20: qty 1

## 2017-04-20 MED ORDER — ONDANSETRON 4 MG PO TBDP: 4.0000 mg | ORAL_TABLET | Freq: Three times a day (TID) | ORAL | 0 refills | Status: DC | PRN

## 2017-04-20 NOTE — Discharge Instructions (Signed)
Your workup today was overall reassuring.  I am concerned your symptoms may be due to the dairy you ingested.  Given your improvement in symptoms, we feel you are safe for discharge home.  Please follow-up with your primary doctor for further management.  If any symptoms change or worsen, please return to the nearest emergency department.

## 2017-04-20 NOTE — ED Triage Notes (Signed)
Pt had gastric sleeve placed 2.5 years ago. Pt ate yogurt last night followed shortly by severe abd pain, fluctuance, burping, diarrhea. Denies N/V. Has experienced these sx once before after drinking milk. Has avoided dairy since the event until last night. Denies chest pain, shortness of breath. Pt tearful in triage.

## 2017-04-20 NOTE — ED Provider Notes (Signed)
[facility] DEPT Provider Note   CSN: [id number] Arrival date & time: 04/20/17  1824     History   Chief Complaint Chief Complaint  Patient presents with  . Abdominal Pain    HPI Cynthia Vazquez is a 47 y.o. female.  The history is provided by the patient, the spouse and medical records.  Abdominal Cramping  This is a new problem. The current episode started yesterday. The problem occurs constantly. The problem has not changed since onset.Associated symptoms include abdominal pain. Pertinent negatives include no chest pain, no headaches and no shortness of breath. Exacerbated by: dairy. Nothing relieves the symptoms. She has tried nothing for the symptoms. The treatment provided no relief.    Past Medical History:  Diagnosis Date  . Diabetes mellitus without complication ([place])   . History of hiatal hernia   . Hypertension   . Iron deficiency anemia due to chronic blood loss    menstrual cycles    Patient Active Problem List   Diagnosis Date Noted  . Other fatigue 01/21/2017  . B12 deficiency 08/08/2016  . Vitamin D deficiency 08/08/2016  . Leukopenia 08/08/2016  . HTN (hypertension) 11/07/2014  . Prediabetes 11/07/2014  . Hiatal hernia 11/07/2014  . Lumbar and sacral osteoarthritis 11/07/2014  . S/P laparoscopic sleeve gastrectomy 11/07/2014  . Morbid obesity with BMI of 50.0-59.9, adult ([place]) 05/09/2014  . Headache 09/16/2013  . Thalassemia trait 08/19/2013  . Iron deficiency anemia due to chronic blood loss 07/14/2013  . Thrombocytosis ([place]) 07/14/2013  . Menorrhagia 07/14/2013    Past Surgical History:  Procedure Laterality Date  . BREATH TEK H PYLORI N/A 07/22/2014   Procedure: BREATH TEK H PYLORI;  Surgeon: Greer Pickerel, MD;  Location: [facility];  Service: General;  Laterality: N/A;  . CESAREAN SECTION    . CHOLECYSTECTOMY    . LAPAROSCOPIC GASTRIC SLEEVE RESECTION N/A 11/07/2014   Procedure: LAPAROSCOPIC GASTRIC SLEEVE  RESECTION HIATAL HERNIA  REPAIR/ UPPER ENDO;  Surgeon: Greer Pickerel, MD;  Location: [facility];  Service: General;  Laterality: N/A;  . TUBAL LIGATION    . UPPER GI ENDOSCOPY  11/07/2014   Procedure: UPPER GI ENDOSCOPY;  Surgeon: Greer Pickerel, MD;  Location: [facility];  Service: General;;    OB History    No data available       Home Medications    Prior to Admission medications   Medication Sig Start Date End Date Taking? Authorizing Provider  Calcium Carb-Cholecalciferol (CALCIUM 600 + D PO) Take 1 tablet by mouth 3 (three) times daily.    [provider]  Cyanocobalamin (B-12) 3000 MCG CAPS Take 1 capsule by mouth once a week. Wednesday    [provider]  fluocinonide (LIDEX) 0.05 % external solution Apply 1 application topically 2 (two) times daily as needed (for itchy scalp).  07/16/14   [provider]  fluticasone (FLONASE) 50 MCG/ACT nasal spray Place 2 sprays into both nostrils daily. Patient not taking: Reported on 08/08/2016 05/14/14   Drenda Freeze, MD  ibuprofen (ADVIL,MOTRIN) 600 MG tablet Take 1 tablet (600 mg total) by mouth every 6 (six) hours as needed. 02/26/15   Marella Chimes, PA-C  levothyroxine (SYNTHROID, LEVOTHROID) 50 MCG tablet Take 50 mcg by mouth daily before breakfast.    [provider]  loratadine (CLARITIN) 10 MG tablet Take 10 mg by mouth daily as needed for allergies.     [provider]  Multiple Vitamins-Minerals (OPTISOURCE POST BARIATRIC SURG) CHEW Chew 2  tablets by mouth 2 (two) times daily.    [provider]  NEXIUM 40 MG capsule Take 40 mg by mouth daily as needed (indigestion.).  10/27/14   [provider]  oxyCODONE (ROXICODONE) 5 MG/5ML solution Take 5-10 mg by mouth every 4 (four) hours as needed for moderate pain or severe pain.    [provider]  traMADol (ULTRAM) 50 MG tablet Take 1 tablet (50 mg total) by mouth every 6 (six) hours as needed. Patient not taking: Reported  on 08/08/2016 02/24/16   Jacqualine Mau, NP    Family History Family History  Problem Relation Age of Onset  . Anemia Mother   . Anemia Paternal Grandfather     Social History Social History   Tobacco Use  . Smoking status: Never Smoker  . Smokeless tobacco: Never Used  Substance Use Topics  . Alcohol use: No  . Drug use: No     Allergies   Shellfish allergy and Augmentin [amoxicillin-pot clavulanate]   Review of Systems Review of Systems  Constitutional: Negative for chills, diaphoresis, fatigue and fever.  HENT: Negative for congestion.   Respiratory: Negative for cough, chest tightness and shortness of breath.   Cardiovascular: Negative for chest pain.  Gastrointestinal: Positive for abdominal pain, diarrhea and nausea. Negative for abdominal distention and vomiting.  Genitourinary: Negative for flank pain.  Musculoskeletal: Negative for back pain, neck pain and neck stiffness.  Skin: Negative for rash and wound.  Neurological: Negative for light-headedness and headaches.  Psychiatric/Behavioral: Negative for agitation.  All other systems reviewed and are negative.    Physical Exam Updated Vital Signs BP (!) 171/84 (BP Location: Left Arm)   Pulse 83   Temp 98.5 F (36.9 C) (Oral)   Resp 20   Ht 5\' 8"  (1.727 m)   Wt 89.8 kg (198 lb)   LMP 04/18/2017   SpO2 100%   BMI 30.11 kg/m   Physical Exam  Constitutional: She is oriented to person, place, and time. She appears well-developed and well-nourished.  Non-toxic appearance. She does not appear ill. No distress.  HENT:  Head: Normocephalic and atraumatic.  Right Ear: External ear normal.  Left Ear: External ear normal.  Nose: Nose normal.  Mouth/Throat: Oropharynx is clear and moist. No oropharyngeal exudate.  Eyes: Conjunctivae and EOM are normal. Pupils are equal, round, and reactive to light.  Neck: Normal range of motion. Neck supple.  Cardiovascular: Normal rate and intact distal pulses.    No murmur heard. Pulmonary/Chest: Effort normal and breath sounds normal. No stridor. No respiratory distress.  Abdominal: Bowel sounds are normal. She exhibits no distension. There is no tenderness. There is no rebound.  Musculoskeletal: She exhibits no tenderness.  Neurological: She is alert and oriented to person, place, and time. She has normal reflexes. She exhibits normal muscle tone. Coordination normal.  Skin: Skin is warm. Capillary refill takes less than 2 seconds. No rash noted. She is not diaphoretic. No erythema.  Psychiatric: She has a normal mood and affect.  Nursing note and vitals reviewed.    ED Treatments / Results  Labs (all labs ordered are listed, but only abnormal results are displayed) Labs Reviewed  COMPREHENSIVE METABOLIC PANEL - Abnormal; Notable for the following components:      Result Value   Glucose, Bld 103 (*)    All other components within normal limits  CBC - Abnormal; Notable for the following components:   WBC 3.9 (*)    MCH 25.1 (*)  RDW 16.0 (*)    All other components within normal limits  LIPASE, BLOOD  URINALYSIS, ROUTINE W REFLEX MICROSCOPIC  I-STAT BETA HCG BLOOD, ED ([facility], [facility], [facility] ONLY)    EKG  EKG Interpretation None       Radiology No results found.  Procedures Procedures (including critical care time)  Medications Ordered in ED Medications  gi cocktail (Maalox,Lidocaine,Donnatal) (30 mLs Oral Given 04/20/17 2244)  ondansetron (ZOFRAN-ODT) disintegrating tablet 4 mg (4 mg Oral Given 04/20/17 2244)     Initial Impression / Assessment and Plan / ED Course  I have reviewed the triage vital signs and the nursing notes.  Pertinent labs & imaging results that were available during my care of the patient were reviewed by me and considered in my medical decision making (see chart for details).     Cynthia Vazquez is a 47 y.o. female with a past medical history significant for gastric sleeve surgery, hypertension, iron  deficiency anemia, and prediabetes who presents with nausea, burping, bloating, abdominal pain, and diarrhea.  Patient reports that her symptoms began last night approximately 1 hour after eating [place] yogurt.  She reports that she has avoided dairy for the last year because it made her feel bad.  She says that she tried it last night again and thinks it is because of her symptoms.  She reports she is having a cramping and burning pain in her upper abdomen that she describes as a 7 out of 10 in severity.  It comes and goes.  She reports she has had several episodes of nonbloody diarrhea today.  She denies vomiting but reports burping bloating and nausea.  She has not taken any medicine for her symptoms.  She denies any recent trauma.  She denies fevers, chills, or any preceding URI symptoms.  She denies any other complaints on arrival.    On exam, patient has no significant tenderness in her abdomen.  Her discomfort was not reproducible.  Lungs were clear and chest was nontender.  Patient had no other abnormality on exam.  Patient is screening laboratory testing revealing a normal lipase and normal CMP.  No evidence of elevated liver function or kidney dysfunction.  CBC showed no evidence of leukocytosis and no anemia.  Patient was not pregnant.  Given the patient's reassuring exam, I do not feel patient has a perforation or consultation with her surgery.  Do not feel patient needs CT imaging as she is nontender and I do not think she had a perforation.  Suspect patient has gastritis in the setting of dairy and she may be lactose intolerant.  Patient will be given nausea medicine and a GI cocktail to try to alleviate her symptoms.    Anticipate patient will be stable for discharge home when she.  She is able to tolerate eating and drinking nondairy products and follow-up with her PCP.   PT felt much better on reassesment and tolerated PO. PT appears safe for discharge. PT discharged in good condition.     Final Clinical Impressions(s) / ED Diagnoses   Final diagnoses:  Epigastric pain  Bloating  Diarrhea, unspecified type  Dairy product intolerance    ED Discharge Orders        Ordered    ondansetron (ZOFRAN ODT) 4 MG disintegrating tablet  Every 8 hours PRN     04/20/17 2347     Clinical Impression: 1. Epigastric pain   2. Bloating   3. Diarrhea, unspecified type   4. Dairy product intolerance  Disposition: Discharge  Condition: Good  I have discussed the results, Dx and Tx plan with the pt(& family if present). He/she/they expressed understanding and agree(s) with the plan. Discharge instructions discussed at great length. Strict return precautions discussed and pt &/or family have verbalized understanding of the instructions. No further questions at time of discharge.    Discharge Medication List as of 04/20/2017 11:47 PM    START taking these medications   Details  ondansetron (ZOFRAN ODT) 4 MG disintegrating tablet Take 1 tablet (4 mg total) by mouth every 8 (eight) hours as needed for nausea or vomiting., Starting Sun 04/20/2017, Print        Follow Up: [name], MD [address] [place] [address] [facility] DEPT [place] [id number] Waldo Daniels       Barnell Shieh, Gwenyth Allegra, MD 04/21/17 [phone]

## 2017-04-20 NOTE — ED Notes (Signed)
Patient stating she feels better. PO challenging patient with water per EDP [place].

## 2017-04-21 DIAGNOSIS — R790 Abnormal level of blood mineral: Secondary | ICD-10-CM | POA: Diagnosis not present

## 2017-04-21 DIAGNOSIS — E782 Mixed hyperlipidemia: Secondary | ICD-10-CM | POA: Diagnosis not present

## 2017-04-21 DIAGNOSIS — I10 Essential (primary) hypertension: Secondary | ICD-10-CM | POA: Diagnosis not present

## 2017-04-21 DIAGNOSIS — E039 Hypothyroidism, unspecified: Secondary | ICD-10-CM | POA: Diagnosis not present

## 2017-04-22 ENCOUNTER — Telehealth: Payer: Self-pay | Admitting: Hematology and Oncology

## 2017-04-22 ENCOUNTER — Telehealth: Payer: Self-pay | Admitting: *Deleted

## 2017-04-22 NOTE — Telephone Encounter (Signed)
LM with note below. Requested a call back to discuss.

## 2017-04-22 NOTE — Telephone Encounter (Signed)
-----   Message from Heath Lark, MD sent at 04/22/2017 10:40 AM EST ----- Regarding: labs I saw she went to the ER 2 days ago. Labs showed she is not anemic. If OK with her, OK to cancel her appt on Thursday and just reschedule to 2 months: labs and 15 mins appt

## 2017-04-22 NOTE — Telephone Encounter (Signed)
Pt left a message- OK to cancel appt and reschedule in 2 months.  Message to scheduler

## 2017-04-22 NOTE — Telephone Encounter (Signed)
Rescheduled appt per 2/26 sch msg - left voicemail for patient regarding appts.

## 2017-04-23 ENCOUNTER — Telehealth: Payer: Self-pay | Admitting: Hematology and Oncology

## 2017-04-23 NOTE — Telephone Encounter (Signed)
Left message for patient regarding upcoming May appointment updates per 2/26 sch message.

## 2017-04-24 ENCOUNTER — Ambulatory Visit: Payer: BLUE CROSS/BLUE SHIELD | Admitting: Hematology and Oncology

## 2017-04-24 ENCOUNTER — Other Ambulatory Visit: Payer: BLUE CROSS/BLUE SHIELD

## 2017-05-05 DIAGNOSIS — I10 Essential (primary) hypertension: Secondary | ICD-10-CM | POA: Diagnosis not present

## 2017-05-05 DIAGNOSIS — E782 Mixed hyperlipidemia: Secondary | ICD-10-CM | POA: Diagnosis not present

## 2017-05-12 DIAGNOSIS — E559 Vitamin D deficiency, unspecified: Secondary | ICD-10-CM | POA: Diagnosis not present

## 2017-05-12 DIAGNOSIS — E782 Mixed hyperlipidemia: Secondary | ICD-10-CM | POA: Diagnosis not present

## 2017-05-12 DIAGNOSIS — I10 Essential (primary) hypertension: Secondary | ICD-10-CM | POA: Diagnosis not present

## 2017-05-20 DIAGNOSIS — I10 Essential (primary) hypertension: Secondary | ICD-10-CM | POA: Diagnosis not present

## 2017-05-26 DIAGNOSIS — I10 Essential (primary) hypertension: Secondary | ICD-10-CM | POA: Diagnosis not present

## 2017-05-26 DIAGNOSIS — E782 Mixed hyperlipidemia: Secondary | ICD-10-CM | POA: Diagnosis not present

## 2017-06-03 DIAGNOSIS — I10 Essential (primary) hypertension: Secondary | ICD-10-CM | POA: Diagnosis not present

## 2017-06-10 DIAGNOSIS — E782 Mixed hyperlipidemia: Secondary | ICD-10-CM | POA: Diagnosis not present

## 2017-06-10 DIAGNOSIS — E559 Vitamin D deficiency, unspecified: Secondary | ICD-10-CM | POA: Diagnosis not present

## 2017-06-10 DIAGNOSIS — E039 Hypothyroidism, unspecified: Secondary | ICD-10-CM | POA: Diagnosis not present

## 2017-06-10 DIAGNOSIS — I10 Essential (primary) hypertension: Secondary | ICD-10-CM | POA: Diagnosis not present

## 2017-06-27 ENCOUNTER — Other Ambulatory Visit: Payer: BLUE CROSS/BLUE SHIELD

## 2017-06-27 ENCOUNTER — Ambulatory Visit: Payer: BLUE CROSS/BLUE SHIELD | Admitting: Hematology and Oncology

## 2017-07-01 DIAGNOSIS — I10 Essential (primary) hypertension: Secondary | ICD-10-CM | POA: Diagnosis not present

## 2017-07-03 ENCOUNTER — Inpatient Hospital Stay: Payer: BLUE CROSS/BLUE SHIELD | Attending: Hematology and Oncology

## 2017-07-03 ENCOUNTER — Inpatient Hospital Stay (HOSPITAL_BASED_OUTPATIENT_CLINIC_OR_DEPARTMENT_OTHER): Payer: BLUE CROSS/BLUE SHIELD | Admitting: Hematology and Oncology

## 2017-07-03 ENCOUNTER — Encounter: Payer: Self-pay | Admitting: Hematology and Oncology

## 2017-07-03 DIAGNOSIS — D72819 Decreased white blood cell count, unspecified: Secondary | ICD-10-CM

## 2017-07-03 DIAGNOSIS — D509 Iron deficiency anemia, unspecified: Secondary | ICD-10-CM | POA: Insufficient documentation

## 2017-07-03 DIAGNOSIS — N920 Excessive and frequent menstruation with regular cycle: Secondary | ICD-10-CM | POA: Insufficient documentation

## 2017-07-03 DIAGNOSIS — D72818 Other decreased white blood cell count: Secondary | ICD-10-CM

## 2017-07-03 DIAGNOSIS — D500 Iron deficiency anemia secondary to blood loss (chronic): Secondary | ICD-10-CM

## 2017-07-03 DIAGNOSIS — E559 Vitamin D deficiency, unspecified: Secondary | ICD-10-CM

## 2017-07-03 DIAGNOSIS — R5383 Other fatigue: Secondary | ICD-10-CM

## 2017-07-03 DIAGNOSIS — N921 Excessive and frequent menstruation with irregular cycle: Secondary | ICD-10-CM

## 2017-07-03 LAB — RETICULOCYTES
RBC.: 4.83 MIL/uL (ref 3.70–5.45)
Retic Count, Absolute: 58 10*3/uL (ref 33.7–90.7)
Retic Ct Pct: 1.2 % (ref 0.7–2.1)

## 2017-07-03 LAB — CBC WITH DIFFERENTIAL (CANCER CENTER ONLY)
Basophils Absolute: 0 10*3/uL (ref 0.0–0.1)
Basophils Relative: 1 %
Eosinophils Absolute: 0.1 10*3/uL (ref 0.0–0.5)
Eosinophils Relative: 3 %
HCT: 38.9 % (ref 34.8–46.6)
Hemoglobin: 12.1 g/dL (ref 11.6–15.9)
Lymphocytes Relative: 41 %
Lymphs Abs: 1.4 10*3/uL (ref 0.9–3.3)
MCH: 25.1 pg (ref 25.1–34.0)
MCHC: 31.1 g/dL — ABNORMAL LOW (ref 31.5–36.0)
MCV: 80.5 fL (ref 79.5–101.0)
Monocytes Absolute: 0.4 10*3/uL (ref 0.1–0.9)
Monocytes Relative: 13 %
Neutro Abs: 1.5 10*3/uL (ref 1.5–6.5)
Neutrophils Relative %: 42 %
Platelet Count: 223 10*3/uL (ref 145–400)
RBC: 4.83 MIL/uL (ref 3.70–5.45)
RDW: 16 % — ABNORMAL HIGH (ref 11.2–14.5)
WBC Count: 3.5 10*3/uL — ABNORMAL LOW (ref 3.9–10.3)

## 2017-07-03 LAB — IRON AND TIBC
Iron: 89 ug/dL (ref 41–142)
SATURATION RATIOS: 28 % (ref 21–57)
TIBC: 315 ug/dL (ref 236–444)
UIBC: 226 ug/dL

## 2017-07-03 LAB — VITAMIN B12: VITAMIN B 12: 900 pg/mL (ref 180–914)

## 2017-07-03 LAB — TSH: TSH: 2.073 u[IU]/mL (ref 0.308–3.960)

## 2017-07-03 LAB — FERRITIN: Ferritin: 23 ng/mL (ref 9–269)

## 2017-07-03 NOTE — Assessment & Plan Note (Signed)
She has intermittent menorrhagia likely due to perimenopausal sybndrome She has negative GYN evaluation

## 2017-07-03 NOTE — Assessment & Plan Note (Signed)
The cause is unknown.  She is not symptomatic.  She does not need antibiotic therapy Prior evaluation including serum vitamin B12, copper and others within normal limits

## 2017-07-03 NOTE — Assessment & Plan Note (Signed)
She is at risk of severe iron deficiency anemia due to malabsorption and menorrhagia Even though she is not anemic, ferritin level is low With her ongoing heavy menstruation, she will likely need treatment The most likely cause of her anemia is due to chronic blood loss/malabsorption syndrome. We discussed some of the risks, benefits, and alternatives of intravenous iron infusions. The patient is symptomatic from anemia and the iron level is critically low. She tolerated oral iron supplement poorly and desires to achieved higher levels of iron faster for adequate hematopoesis. Some of the side-effects to be expected including risks of infusion reactions, phlebitis, headaches, nausea and fatigue.  The patient is willing to proceed. Patient education material was dispensed.  Goal is to keep ferritin level greater than 50

## 2017-07-03 NOTE — Progress Notes (Signed)
[name] OFFICE PROGRESS NOTE  Cynthia Jordan, MD  ASSESSMENT & PLAN:  Iron deficiency anemia due to chronic blood loss She is at risk of severe iron deficiency anemia due to malabsorption and menorrhagia Even though she is not anemic, ferritin level is low With her ongoing heavy menstruation, she will likely need treatment The most likely cause of her anemia is due to chronic blood loss/malabsorption syndrome. We discussed some of the risks, benefits, and alternatives of intravenous iron infusions. The patient is symptomatic from anemia and the iron level is critically low. She tolerated oral iron supplement poorly and desires to achieved higher levels of iron faster for adequate hematopoesis. Some of the side-effects to be expected including risks of infusion reactions, phlebitis, headaches, nausea and fatigue.  The patient is willing to proceed. Patient education material was dispensed.  Goal is to keep ferritin level greater than 50    Menorrhagia She has intermittent menorrhagia likely due to perimenopausal sybndrome She has negative GYN evaluation  Leukopenia The cause is unknown.  She is not symptomatic.  She does not need antibiotic therapy Prior evaluation including serum vitamin B12, copper and others within normal limits   No orders of the defined types were placed in this encounter.   INTERVAL HISTORY: Cynthia Vazquez 47 y.o. female returns for further follow-up. She complained of fatigue She has excessive menorrhagia starting a few days ago She has irregular menstruation which she attributed to perimenopausal symptoms The patient denies any recent signs or symptoms of bleeding such as spontaneous epistaxis, hematuria or hematochezia. She denies recent infection  SUMMARY OF HEMATOLOGIC HISTORY:  She was found to have abnormal CBC from routine blood work. In November 2008, she has anemia with hemoglobin of 9.7, MCV of 58.4 with platelet count elevation  at 469. On 07/13/2013, she is still anemic with hemoglobin 9.1, MCV of 60.7 and elevated platelet count of 434. Iron studies performed in her physician's office was very low. She denies recent chest pain on exertion, shortness of breath on minimal exertion, or palpitations. She did complain of profound weakness, dizziness as well as new onset of leg cramps. She had not noticed any recent bleeding such as epistaxis, hematuria or hematochezia. She does complain of excessive menstruation. She has periods 7-10 days every month with significant heavy periods in the first few days of each menstrual cycle. On 07/15/2013 and 09/16/2013, she received intravenous iron. On 05/09/2014-05/16/2014, she received IV Feraheme On 11/07/2014, she underwent laparoscopic sleeve gastrectomy with hiatal hernia repair. She received IV iron intermittently I have reviewed the past medical history, past surgical history, social history and family history with the patient and they are unchanged from previous note.  ALLERGIES:  is allergic to shellfish allergy and augmentin [amoxicillin-pot clavulanate].  MEDICATIONS:  Current Outpatient Medications  Medication Sig Dispense Refill  . Calcium Carb-Cholecalciferol (CALCIUM 600 + D PO) Take 1 tablet by mouth 3 (three) times daily.    . Cyanocobalamin (B-12) 3000 MCG CAPS Take 1 capsule by mouth once a week. Wednesday    . fluocinonide (LIDEX) 0.05 % external solution Apply 1 application topically 2 (two) times daily as needed (for itchy scalp).   0  . fluticasone (FLONASE) 50 MCG/ACT nasal spray Place 2 sprays into both nostrils daily. (Patient not taking: Reported on 08/08/2016) 16 g 0  . ibuprofen (ADVIL,MOTRIN) 600 MG tablet Take 1 tablet (600 mg total) by mouth every 6 (six) hours as needed. 30 tablet 0  . levothyroxine (SYNTHROID, LEVOTHROID)  50 MCG tablet Take 50 mcg by mouth daily before breakfast.    . loratadine (CLARITIN) 10 MG tablet Take 10 mg by mouth daily as needed  for allergies.     . Multiple Vitamins-Minerals (OPTISOURCE POST BARIATRIC SURG) CHEW Chew 2 tablets by mouth 2 (two) times daily.    Marland Kitchen NEXIUM 40 MG capsule Take 40 mg by mouth daily as needed (indigestion.).   0  . ondansetron (ZOFRAN ODT) 4 MG disintegrating tablet Take 1 tablet (4 mg total) by mouth every 8 (eight) hours as needed for nausea or vomiting. 12 tablet 0  . oxyCODONE (ROXICODONE) 5 MG/5ML solution Take 5-10 mg by mouth every 4 (four) hours as needed for moderate pain or severe pain.    . traMADol (ULTRAM) 50 MG tablet Take 1 tablet (50 mg total) by mouth every 6 (six) hours as needed. (Patient not taking: Reported on 08/08/2016) 15 tablet 0   No current facility-administered medications for this visit.      REVIEW OF SYSTEMS:   Constitutional: Denies fevers, chills or night sweats Eyes: Denies blurriness of vision Ears, nose, mouth, throat, and face: Denies mucositis or sore throat Respiratory: Denies cough, dyspnea or wheezes Cardiovascular: Denies palpitation, chest discomfort or lower extremity swelling Gastrointestinal:  Denies nausea, heartburn or change in bowel habits Skin: Denies abnormal skin rashes Lymphatics: Denies new lymphadenopathy or easy bruising Neurological:Denies numbness, tingling or new weaknesses Behavioral/Psych: Mood is stable, no new changes  All other systems were reviewed with the patient and are negative.  PHYSICAL EXAMINATION: ECOG PERFORMANCE STATUS: 1 - Symptomatic but completely ambulatory  Vitals:   07/03/17 1115  BP: (!) 149/91  Pulse: 62  Resp: 20  Temp: 98.4 F (36.9 C)  SpO2: 100%   Filed Weights   07/03/17 1115  Weight: 198 lb 9.6 oz (90.1 kg)    GENERAL:alert, no distress and comfortable SKIN: skin color, texture, turgor are normal, no rashes or significant lesions EYES: normal, Conjunctiva are pink and non-injected, sclera clear OROPHARYNX:no exudate, no erythema and lips, buccal mucosa, and tongue normal  NECK:  supple, thyroid normal size, non-tender, without nodularity LYMPH:  no palpable lymphadenopathy in the cervical, axillary or inguinal LUNGS: clear to auscultation and percussion with normal breathing effort HEART: regular rate & rhythm and no murmurs and no lower extremity edema ABDOMEN:abdomen soft, non-tender and normal bowel sounds Musculoskeletal:no cyanosis of digits and no clubbing  NEURO: alert & oriented x 3 with fluent speech, no focal motor/sensory deficits  LABORATORY DATA:  I have reviewed the data as listed     Component Value Date/Time   NA 138 04/20/2017 1914   K 3.8 04/20/2017 1914   CL 106 04/20/2017 1914   CO2 22 04/20/2017 1914   GLUCOSE 103 (H) 04/20/2017 1914   BUN 16 04/20/2017 1914   CREATININE 0.86 04/20/2017 1914   CALCIUM 9.0 04/20/2017 1914   PROT 7.3 04/20/2017 1914   ALBUMIN 3.9 04/20/2017 1914   AST 27 04/20/2017 1914   ALT 18 04/20/2017 1914   ALKPHOS 77 04/20/2017 1914   BILITOT 0.5 04/20/2017 1914   GFRNONAA >60 04/20/2017 1914   GFRAA >60 04/20/2017 1914    No results found for: SPEP, UPEP  Lab Results  Component Value Date   WBC 3.5 (L) 07/03/2017   NEUTROABS 1.5 07/03/2017   HGB 12.1 07/03/2017   HCT 38.9 07/03/2017   MCV 80.5 07/03/2017   PLT 223 07/03/2017      Chemistry  Component Value Date/Time   NA 138 04/20/2017 1914   K 3.8 04/20/2017 1914   CL 106 04/20/2017 1914   CO2 22 04/20/2017 1914   BUN 16 04/20/2017 1914   CREATININE 0.86 04/20/2017 1914      Component Value Date/Time   CALCIUM 9.0 04/20/2017 1914   ALKPHOS 77 04/20/2017 1914   AST 27 04/20/2017 1914   ALT 18 04/20/2017 1914   BILITOT 0.5 04/20/2017 1914       I spent 15 minutes counseling the patient face to face. The total time spent in the appointment was 20 minutes and more than 50% was on counseling.   All questions were answered. The patient knows to call the clinic with any problems, questions or concerns. No barriers to learning was  detected.    Heath Lark, MD 5/9/20193:04 PM

## 2017-07-04 ENCOUNTER — Telehealth: Payer: Self-pay | Admitting: Hematology and Oncology

## 2017-07-04 ENCOUNTER — Other Ambulatory Visit: Payer: Self-pay | Admitting: Hematology and Oncology

## 2017-07-04 ENCOUNTER — Telehealth: Payer: Self-pay | Admitting: *Deleted

## 2017-07-04 LAB — VITAMIN D 25 HYDROXY (VIT D DEFICIENCY, FRACTURES): VIT D 25 HYDROXY: 37.8 ng/mL (ref 30.0–100.0)

## 2017-07-04 NOTE — Telephone Encounter (Signed)
Left message for patient regarding upcoming May appointments per 5/10 sch message

## 2017-07-04 NOTE — Telephone Encounter (Signed)
Notified of message below. States she is OK to receive IV Iron. Would like to start on  5/23 if possible because she has started a new job and will have insurance by 5/23. Is also asking to receive the second dose 2 weeks later as she has every other Thursday off from work.   If OK, I will send a schedule message

## 2017-07-04 NOTE — Telephone Encounter (Signed)
-----   Message from Heath Lark, MD sent at 07/04/2017  8:29 AM EDT ----- Regarding: low ferritin Pls call her. Ferritin is low B12 and vitamin D ok  If OK with her, proceed with IV iron weekly x 2. Repeat labs only in 6 months. Please send scheduling msg

## 2017-07-04 NOTE — Telephone Encounter (Signed)
Message to scheduler 

## 2017-07-04 NOTE — Telephone Encounter (Signed)
Pls send scehduling msg based on her preference Orders are in

## 2017-07-15 DIAGNOSIS — I10 Essential (primary) hypertension: Secondary | ICD-10-CM | POA: Diagnosis not present

## 2017-07-15 DIAGNOSIS — E559 Vitamin D deficiency, unspecified: Secondary | ICD-10-CM | POA: Diagnosis not present

## 2017-07-17 ENCOUNTER — Inpatient Hospital Stay: Payer: BLUE CROSS/BLUE SHIELD

## 2017-07-17 VITALS — BP 156/87 | HR 71 | Temp 99.3°F | Resp 20

## 2017-07-17 DIAGNOSIS — D72819 Decreased white blood cell count, unspecified: Secondary | ICD-10-CM | POA: Diagnosis not present

## 2017-07-17 DIAGNOSIS — D509 Iron deficiency anemia, unspecified: Secondary | ICD-10-CM | POA: Diagnosis not present

## 2017-07-17 DIAGNOSIS — N920 Excessive and frequent menstruation with regular cycle: Secondary | ICD-10-CM | POA: Diagnosis not present

## 2017-07-17 DIAGNOSIS — D500 Iron deficiency anemia secondary to blood loss (chronic): Secondary | ICD-10-CM

## 2017-07-17 MED ORDER — SODIUM CHLORIDE 0.9 % IV SOLN
Freq: Once | INTRAVENOUS | Status: AC
  Administered 2017-07-17: 10:00:00 via INTRAVENOUS

## 2017-07-17 MED ORDER — SODIUM CHLORIDE 0.9 % IV SOLN
510.0000 mg | Freq: Once | INTRAVENOUS | Status: AC
  Administered 2017-07-17: 510 mg via INTRAVENOUS
  Filled 2017-07-17: qty 17

## 2017-07-17 NOTE — Patient Instructions (Signed)

## 2017-07-24 ENCOUNTER — Ambulatory Visit: Payer: BLUE CROSS/BLUE SHIELD

## 2017-07-31 ENCOUNTER — Inpatient Hospital Stay: Payer: BLUE CROSS/BLUE SHIELD | Attending: Hematology and Oncology

## 2017-07-31 VITALS — BP 125/82 | HR 62 | Temp 98.9°F | Resp 18

## 2017-07-31 DIAGNOSIS — N920 Excessive and frequent menstruation with regular cycle: Secondary | ICD-10-CM | POA: Diagnosis not present

## 2017-07-31 DIAGNOSIS — D508 Other iron deficiency anemias: Secondary | ICD-10-CM | POA: Diagnosis not present

## 2017-07-31 DIAGNOSIS — D500 Iron deficiency anemia secondary to blood loss (chronic): Secondary | ICD-10-CM

## 2017-07-31 DIAGNOSIS — I10 Essential (primary) hypertension: Secondary | ICD-10-CM | POA: Diagnosis not present

## 2017-07-31 DIAGNOSIS — E559 Vitamin D deficiency, unspecified: Secondary | ICD-10-CM | POA: Diagnosis not present

## 2017-07-31 MED ORDER — SODIUM CHLORIDE 0.9 % IV SOLN
510.0000 mg | Freq: Once | INTRAVENOUS | Status: AC
  Administered 2017-07-31: 510 mg via INTRAVENOUS
  Filled 2017-07-31: qty 17

## 2017-07-31 MED ORDER — SODIUM CHLORIDE 0.9 % IV SOLN
Freq: Once | INTRAVENOUS | Status: AC
  Administered 2017-07-31: 12:00:00 via INTRAVENOUS

## 2017-07-31 NOTE — Patient Instructions (Signed)

## 2017-08-25 DIAGNOSIS — I10 Essential (primary) hypertension: Secondary | ICD-10-CM | POA: Diagnosis not present

## 2017-09-25 DIAGNOSIS — E782 Mixed hyperlipidemia: Secondary | ICD-10-CM | POA: Diagnosis not present

## 2017-09-25 DIAGNOSIS — E039 Hypothyroidism, unspecified: Secondary | ICD-10-CM | POA: Diagnosis not present

## 2017-09-25 DIAGNOSIS — E663 Overweight: Secondary | ICD-10-CM | POA: Diagnosis not present

## 2017-10-09 DIAGNOSIS — E782 Mixed hyperlipidemia: Secondary | ICD-10-CM | POA: Diagnosis not present

## 2017-10-09 DIAGNOSIS — R635 Abnormal weight gain: Secondary | ICD-10-CM | POA: Diagnosis not present

## 2017-11-20 DIAGNOSIS — E782 Mixed hyperlipidemia: Secondary | ICD-10-CM | POA: Diagnosis not present

## 2017-11-20 DIAGNOSIS — E663 Overweight: Secondary | ICD-10-CM | POA: Diagnosis not present

## 2017-11-20 DIAGNOSIS — E559 Vitamin D deficiency, unspecified: Secondary | ICD-10-CM | POA: Diagnosis not present

## 2017-11-20 DIAGNOSIS — I10 Essential (primary) hypertension: Secondary | ICD-10-CM | POA: Diagnosis not present

## 2018-01-01 DIAGNOSIS — E039 Hypothyroidism, unspecified: Secondary | ICD-10-CM | POA: Diagnosis not present

## 2018-01-01 DIAGNOSIS — I10 Essential (primary) hypertension: Secondary | ICD-10-CM | POA: Diagnosis not present

## 2018-01-07 ENCOUNTER — Other Ambulatory Visit: Payer: Self-pay

## 2018-01-07 DIAGNOSIS — D500 Iron deficiency anemia secondary to blood loss (chronic): Secondary | ICD-10-CM

## 2018-01-08 ENCOUNTER — Other Ambulatory Visit: Payer: BLUE CROSS/BLUE SHIELD

## 2018-01-08 ENCOUNTER — Inpatient Hospital Stay: Payer: BLUE CROSS/BLUE SHIELD | Attending: Hematology and Oncology

## 2018-01-08 DIAGNOSIS — N920 Excessive and frequent menstruation with regular cycle: Secondary | ICD-10-CM | POA: Diagnosis not present

## 2018-01-08 DIAGNOSIS — D500 Iron deficiency anemia secondary to blood loss (chronic): Secondary | ICD-10-CM | POA: Insufficient documentation

## 2018-01-08 LAB — CBC WITH DIFFERENTIAL (CANCER CENTER ONLY)
ABS IMMATURE GRANULOCYTES: 0.01 10*3/uL (ref 0.00–0.07)
BASOS PCT: 1 %
Basophils Absolute: 0 10*3/uL (ref 0.0–0.1)
Eosinophils Absolute: 0.1 10*3/uL (ref 0.0–0.5)
Eosinophils Relative: 3 %
HCT: 44.7 % (ref 36.0–46.0)
HEMOGLOBIN: 13.5 g/dL (ref 12.0–15.0)
Immature Granulocytes: 0 %
LYMPHS PCT: 39 %
Lymphs Abs: 1.4 10*3/uL (ref 0.7–4.0)
MCH: 26.3 pg (ref 26.0–34.0)
MCHC: 30.2 g/dL (ref 30.0–36.0)
MCV: 87.1 fL (ref 80.0–100.0)
MONO ABS: 0.3 10*3/uL (ref 0.1–1.0)
Monocytes Relative: 9 %
NEUTROS ABS: 1.7 10*3/uL (ref 1.7–7.7)
Neutrophils Relative %: 48 %
PLATELETS: 247 10*3/uL (ref 150–400)
RBC: 5.13 MIL/uL — AB (ref 3.87–5.11)
RDW: 13.5 % (ref 11.5–15.5)
WBC Count: 3.6 10*3/uL — ABNORMAL LOW (ref 4.0–10.5)
nRBC: 0 % (ref 0.0–0.2)

## 2018-01-08 LAB — IRON AND TIBC
Iron: 89 ug/dL (ref 41–142)
Saturation Ratios: 28 % (ref 21–57)
TIBC: 314 ug/dL (ref 236–444)
UIBC: 224 ug/dL (ref 120–384)

## 2018-01-08 LAB — FERRITIN: Ferritin: 58 ng/mL (ref 11–307)

## 2018-01-13 ENCOUNTER — Telehealth: Payer: Self-pay | Admitting: *Deleted

## 2018-01-13 NOTE — Telephone Encounter (Signed)
Cynthia Vazquez left a message requesting her lab results. Ferritin is 58. Not further appts scheduled at this time.

## 2018-01-23 ENCOUNTER — Telehealth: Payer: Self-pay | Admitting: *Deleted

## 2018-01-23 NOTE — Telephone Encounter (Signed)
LM stating that she should have labs checked in 3 months by PCP. To call if she has questions.

## 2018-01-23 NOTE — Telephone Encounter (Signed)
She has not needed IV iron since June I recommend recheck in 3 months with PCP

## 2018-03-08 ENCOUNTER — Other Ambulatory Visit: Payer: Self-pay

## 2018-03-08 ENCOUNTER — Encounter (HOSPITAL_COMMUNITY): Payer: Self-pay | Admitting: Emergency Medicine

## 2018-03-08 DIAGNOSIS — R197 Diarrhea, unspecified: Secondary | ICD-10-CM | POA: Diagnosis not present

## 2018-03-08 DIAGNOSIS — E119 Type 2 diabetes mellitus without complications: Secondary | ICD-10-CM | POA: Diagnosis not present

## 2018-03-08 DIAGNOSIS — R109 Unspecified abdominal pain: Secondary | ICD-10-CM | POA: Diagnosis present

## 2018-03-08 DIAGNOSIS — Z79899 Other long term (current) drug therapy: Secondary | ICD-10-CM | POA: Diagnosis not present

## 2018-03-08 DIAGNOSIS — R112 Nausea with vomiting, unspecified: Secondary | ICD-10-CM | POA: Insufficient documentation

## 2018-03-08 DIAGNOSIS — I10 Essential (primary) hypertension: Secondary | ICD-10-CM | POA: Diagnosis not present

## 2018-03-08 LAB — COMPREHENSIVE METABOLIC PANEL
ALT: 30 U/L (ref 0–44)
AST: 36 U/L (ref 15–41)
Albumin: 4.5 g/dL (ref 3.5–5.0)
Alkaline Phosphatase: 79 U/L (ref 38–126)
Anion gap: 10 (ref 5–15)
BUN: 19 mg/dL (ref 6–20)
CO2: 22 mmol/L (ref 22–32)
Calcium: 9 mg/dL (ref 8.9–10.3)
Chloride: 106 mmol/L (ref 98–111)
Creatinine, Ser: 0.71 mg/dL (ref 0.44–1.00)
GFR calc Af Amer: >60 mL/min (ref >60)
GFR calc non Af Amer: >60 mL/min (ref >60)
Glucose, Bld: 93 mg/dL (ref 70–99)
Potassium: 3.6 mmol/L (ref 3.5–5.1)
Sodium: 138 mmol/L (ref 135–145)
Total Bilirubin: 0.9 mg/dL (ref 0.3–1.2)
Total Protein: 7.9 g/dL (ref 6.5–8.1)

## 2018-03-08 LAB — URINALYSIS, ROUTINE W REFLEX MICROSCOPIC
Bacteria, UA: NONE SEEN
Bilirubin Urine: NEGATIVE
Glucose, UA: NEGATIVE mg/dL
Ketones, ur: 5 mg/dL — AB
Leukocytes, UA: NEGATIVE
Nitrite: NEGATIVE
Protein, ur: NEGATIVE mg/dL
Specific Gravity, Urine: 1.024 (ref 1.005–1.030)
pH: 5 (ref 5.0–8.0)

## 2018-03-08 LAB — I-STAT BETA HCG BLOOD, ED (MC, WL, AP ONLY): I-stat hCG, quantitative: <5 m[IU]/mL (ref <5)

## 2018-03-08 LAB — CBC
HCT: 42.7 % (ref 36.0–46.0)
Hemoglobin: 13 g/dL (ref 12.0–15.0)
MCH: 25.6 pg — ABNORMAL LOW (ref 26.0–34.0)
MCHC: 30.4 g/dL (ref 30.0–36.0)
MCV: 84.1 fL (ref 80.0–100.0)
NRBC: 0 % (ref 0.0–0.2)
PLATELETS: 214 10*3/uL (ref 150–400)
RBC: 5.08 MIL/uL (ref 3.87–5.11)
RDW: 13.3 % (ref 11.5–15.5)
WBC: 4.6 10*3/uL (ref 4.0–10.5)

## 2018-03-08 LAB — LIPASE, BLOOD: LIPASE: 31 U/L (ref 11–51)

## 2018-03-08 MED ORDER — ONDANSETRON 4 MG PO TBDP
4.0000 mg | ORAL_TABLET | Freq: Once | ORAL | Status: AC | PRN
  Administered 2018-03-08: 4 mg via ORAL
  Filled 2018-03-08: qty 1

## 2018-03-08 NOTE — ED Triage Notes (Signed)
Pt reports having vomiting and diarrhea that started today after eating out today. Pt reports episode started at 3pm today.

## 2018-03-09 ENCOUNTER — Emergency Department (HOSPITAL_COMMUNITY)
Admission: EM | Admit: 2018-03-09 | Discharge: 2018-03-09 | Disposition: A | Discharge status: Home / Self Care | Payer: BLUE CROSS/BLUE SHIELD | Attending: Emergency Medicine | Admitting: Emergency Medicine

## 2018-03-09 DIAGNOSIS — R112 Nausea with vomiting, unspecified: Secondary | ICD-10-CM

## 2018-03-09 DIAGNOSIS — R197 Diarrhea, unspecified: Secondary | ICD-10-CM

## 2018-03-09 MED ORDER — DIPHENHYDRAMINE HCL 50 MG/ML IJ SOLN
12.5000 mg | Freq: Once | INTRAMUSCULAR | Status: AC
  Administered 2018-03-09: 12.5 mg via INTRAVENOUS
  Filled 2018-03-09: qty 1

## 2018-03-09 MED ORDER — ONDANSETRON 4 MG PO TBDP: 4.0000 mg | ORAL_TABLET | Freq: Three times a day (TID) | ORAL | 0 refills | Status: DC | PRN

## 2018-03-09 MED ORDER — SODIUM CHLORIDE 0.9 % IV BOLUS
1000.0000 mL | Freq: Once | INTRAVENOUS | Status: AC
  Administered 2018-03-09: 1000 mL via INTRAVENOUS

## 2018-03-09 MED ORDER — METOCLOPRAMIDE HCL 5 MG/ML IJ SOLN
10.0000 mg | Freq: Once | INTRAMUSCULAR | Status: AC
  Administered 2018-03-09: 10 mg via INTRAVENOUS
  Filled 2018-03-09: qty 2

## 2018-03-09 NOTE — ED Provider Notes (Signed)
[facility] DEPT Provider Note   CSN: [id number] Arrival date & time: 03/08/18  2118     History   Chief Complaint Chief Complaint  Patient presents with  . Abdominal Pain  . Emesis    HPI Cynthia Vazquez is a 48 y.o. female.  Patient to ED with nausea, vomiting and diarrhea since yesterday afternoon. No fever, hematemesis or hematochezia. She ate fish for lunch and symptoms started without 2 hours making her feel the fish was contaminated/bad. She has been unable to tolerate anything by mouth and feels generally weak. No syncope. Since arrival to ED she reports frontal headache that is worse with vomiting and exposure to light. History of similar headache once in the past. No visual changes.  The history is provided by the patient. No language interpreter was used.  Abdominal Pain  Associated symptoms: diarrhea, nausea and vomiting   Associated symptoms: no chest pain, no chills, no fever and no shortness of breath   Emesis  Associated symptoms: abdominal pain, diarrhea and headaches   Associated symptoms: no chills and no fever     Past Medical History:  Diagnosis Date  . Diabetes mellitus without complication ([place])   . History of hiatal hernia   . Hypertension   . Iron deficiency anemia due to chronic blood loss    menstrual cycles    Patient Active Problem List   Diagnosis Date Noted  . Other fatigue 01/21/2017  . B12 deficiency 08/08/2016  . Vitamin D deficiency 08/08/2016  . Leukopenia 08/08/2016  . HTN (hypertension) 11/07/2014  . Prediabetes 11/07/2014  . Hiatal hernia 11/07/2014  . Lumbar and sacral osteoarthritis 11/07/2014  . S/P laparoscopic sleeve gastrectomy 11/07/2014  . Morbid obesity with BMI of 50.0-59.9, adult ([place]) 05/09/2014  . Headache 09/16/2013  . Thalassemia trait 08/19/2013  . Iron deficiency anemia due to chronic blood loss 07/14/2013  . Thrombocytosis ([place]) 07/14/2013  . Menorrhagia 07/14/2013    Past  Surgical History:  Procedure Laterality Date  . BREATH TEK H PYLORI N/A 07/22/2014   Procedure: BREATH TEK H PYLORI;  Surgeon: Greer Pickerel, MD;  Location: [facility];  Service: General;  Laterality: N/A;  . CESAREAN SECTION    . CHOLECYSTECTOMY    . LAPAROSCOPIC GASTRIC SLEEVE RESECTION N/A 11/07/2014   Procedure: LAPAROSCOPIC GASTRIC SLEEVE RESECTION HIATAL HERNIA  REPAIR/ UPPER ENDO;  Surgeon: Greer Pickerel, MD;  Location: [facility];  Service: General;  Laterality: N/A;  . TUBAL LIGATION    . UPPER GI ENDOSCOPY  11/07/2014   Procedure: UPPER GI ENDOSCOPY;  Surgeon: Greer Pickerel, MD;  Location: [facility];  Service: General;;     OB History   No obstetric history on file.      Home Medications    Prior to Admission medications   Medication Sig Start Date End Date Taking? Authorizing Provider  levothyroxine (SYNTHROID, LEVOTHROID) 50 MCG tablet Take 50 mcg by mouth daily before breakfast.   Yes [provider]  lisinopril-hydrochlorothiazide (PRINZIDE,ZESTORETIC) 10-12.5 MG tablet Take 1 tablet by mouth daily. 05/29/16  Yes [provider]  loratadine (CLARITIN) 10 MG tablet Take 10 mg by mouth daily as needed for allergies.    Yes [provider]  Multiple Vitamins-Minerals (OPTISOURCE POST BARIATRIC SURG) CHEW Chew 2 tablets by mouth 2 (two) times daily.   Yes [provider]    Family History Family History  Problem Relation Age of Onset  . Anemia Mother   . Anemia Paternal Grandfather  Social History Social History   Tobacco Use  . Smoking status: Never Smoker  . Smokeless tobacco: Never Used  Substance Use Topics  . Alcohol use: No  . Drug use: No     Allergies   Shellfish allergy and Augmentin [amoxicillin-pot clavulanate]   Review of Systems Review of Systems  Constitutional: Negative for chills and fever.  HENT: Negative.   Eyes: Negative for visual disturbance.  Respiratory: Negative.  Negative for shortness of breath.     Cardiovascular: Negative.  Negative for chest pain.  Gastrointestinal: Positive for abdominal pain, diarrhea, nausea and vomiting.  Musculoskeletal: Negative.   Skin: Negative.   Neurological: Positive for headaches.     Physical Exam Updated Vital Signs BP (!) 145/89 (BP Location: Left Arm)   Pulse 83   Temp 99.6 F (37.6 C) (Oral)   Resp 16   Ht 5\' 9"  (1.753 m)   Wt 88.5 kg   LMP 03/03/2018   SpO2 100%   BMI 28.80 kg/m   Physical Exam Vitals signs and nursing note reviewed.  Constitutional:      Appearance: She is well-developed.  HENT:     Head: Normocephalic.  Neck:     Musculoskeletal: Normal range of motion and neck supple.  Cardiovascular:     Rate and Rhythm: Normal rate and regular rhythm.  Pulmonary:     Effort: Pulmonary effort is normal.     Breath sounds: Normal breath sounds.  Abdominal:     General: Bowel sounds are normal.     Palpations: Abdomen is soft.     Tenderness: There is no abdominal tenderness. There is no guarding or rebound.  Musculoskeletal: Normal range of motion.  Skin:    General: Skin is warm and dry.     Findings: No rash.  Neurological:     Mental Status: She is alert and oriented to person, place, and time.     Sensory: No sensory deficit.     Motor: No weakness (No lateralizing weakness. ).     Coordination: Coordination normal.      ED Treatments / Results  Labs (all labs ordered are listed, but only abnormal results are displayed) Labs Reviewed  CBC - Abnormal; Notable for the following components:      Result Value   MCH 25.6 (*)    All other components within normal limits  URINALYSIS, ROUTINE W REFLEX MICROSCOPIC - Abnormal; Notable for the following components:   Hgb urine dipstick MODERATE (*)    Ketones, ur 5 (*)    All other components within normal limits  LIPASE, BLOOD  COMPREHENSIVE METABOLIC PANEL  I-STAT BETA HCG BLOOD, ED ([facility], [facility], [facility] ONLY)    EKG None  Radiology No results  found.  Procedures Procedures (including critical care time)  Medications Ordered in ED Medications  sodium chloride 0.9 % bolus 1,000 mL (1,000 mLs Intravenous New Bag/Given 03/09/18 0215)  ondansetron (ZOFRAN-ODT) disintegrating tablet 4 mg (4 mg Oral Given 03/08/18 2201)  metoCLOPramide (REGLAN) injection 10 mg (10 mg Intravenous Given 03/09/18 0212)  diphenhydrAMINE (BENADRYL) injection 12.5 mg (12.5 mg Intravenous Given 03/09/18 0212)     Initial Impression / Assessment and Plan / ED Course  I have reviewed the triage vital signs and the nursing notes.  Pertinent labs & imaging results that were available during my care of the patient were reviewed by me and considered in my medical decision making (see chart for details).    Patient to ED with Brain Hilts, D  that started about 2 hours after eating fish she felt was bad. No fever, bloody emesis or stool. Onset headache after arrival in ED.   No neurologic deficits on exam. She reports history of similar headache.   Patient given Zofran for nausea with some improvement. She is given Reglan and Benadryl along with IVF's for headache and hydration. On re-evaluation she is feeling much better. No further vomiting. Headache resolved.   Final Clinical Impressions(s) / ED Diagnoses   Final diagnoses:  None   1. Nausea, vomiting, diarrhea  ED Discharge Orders    None       Charlann Lange, PA-C 03/09/18 0446    Ripley Fraise, MD 03/09/18 0530

## 2018-03-09 NOTE — Discharge Instructions (Addendum)
Push fluids in small amounts. Take Zofran as directed, as needed for nausea and to control vomiting. Recommend Imodium IF having more than 5 bowel movements daily. Return to the ED if you develop any high fever, severe pain, blood vomiting or diarrhea. Otherwise, follow up with your doctor if symptoms persist.

## 2018-07-27 ENCOUNTER — Encounter: Payer: Self-pay | Admitting: Hematology and Oncology

## 2018-10-12 ENCOUNTER — Ambulatory Visit
Admission: RE | Admit: 2018-10-12 | Discharge: 2018-10-12 | Disposition: A | Discharge status: Home / Self Care | Payer: BC Managed Care – PPO | Source: Ambulatory Visit | Attending: Family Medicine | Admitting: Family Medicine

## 2018-10-12 ENCOUNTER — Other Ambulatory Visit: Payer: Self-pay

## 2018-10-12 ENCOUNTER — Other Ambulatory Visit: Payer: Self-pay | Admitting: Family Medicine

## 2018-10-12 DIAGNOSIS — Z1231 Encounter for screening mammogram for malignant neoplasm of breast: Secondary | ICD-10-CM

## 2019-01-08 DIAGNOSIS — F509 Eating disorder, unspecified: Secondary | ICD-10-CM | POA: Diagnosis not present

## 2019-01-12 DIAGNOSIS — Z6829 Body mass index (BMI) 29.0-29.9, adult: Secondary | ICD-10-CM | POA: Diagnosis not present

## 2019-01-12 DIAGNOSIS — R635 Abnormal weight gain: Secondary | ICD-10-CM | POA: Diagnosis not present

## 2019-01-12 DIAGNOSIS — I10 Essential (primary) hypertension: Secondary | ICD-10-CM | POA: Diagnosis not present

## 2019-01-17 DIAGNOSIS — Z20828 Contact with and (suspected) exposure to other viral communicable diseases: Secondary | ICD-10-CM | POA: Diagnosis not present

## 2019-01-26 DIAGNOSIS — Z6829 Body mass index (BMI) 29.0-29.9, adult: Secondary | ICD-10-CM | POA: Diagnosis not present

## 2019-01-26 DIAGNOSIS — E782 Mixed hyperlipidemia: Secondary | ICD-10-CM | POA: Diagnosis not present

## 2019-02-09 DIAGNOSIS — E039 Hypothyroidism, unspecified: Secondary | ICD-10-CM | POA: Diagnosis not present

## 2019-02-09 DIAGNOSIS — Z6829 Body mass index (BMI) 29.0-29.9, adult: Secondary | ICD-10-CM | POA: Diagnosis not present

## 2019-02-09 DIAGNOSIS — I10 Essential (primary) hypertension: Secondary | ICD-10-CM | POA: Diagnosis not present

## 2019-02-23 DIAGNOSIS — F509 Eating disorder, unspecified: Secondary | ICD-10-CM | POA: Diagnosis not present

## 2019-02-25 DIAGNOSIS — Z6829 Body mass index (BMI) 29.0-29.9, adult: Secondary | ICD-10-CM | POA: Diagnosis not present

## 2019-02-25 DIAGNOSIS — E559 Vitamin D deficiency, unspecified: Secondary | ICD-10-CM | POA: Diagnosis not present

## 2019-03-01 DIAGNOSIS — F4322 Adjustment disorder with anxiety: Secondary | ICD-10-CM | POA: Diagnosis not present

## 2019-03-12 DIAGNOSIS — Z6829 Body mass index (BMI) 29.0-29.9, adult: Secondary | ICD-10-CM | POA: Diagnosis not present

## 2019-03-12 DIAGNOSIS — I10 Essential (primary) hypertension: Secondary | ICD-10-CM | POA: Diagnosis not present

## 2019-03-26 DIAGNOSIS — Z6829 Body mass index (BMI) 29.0-29.9, adult: Secondary | ICD-10-CM | POA: Diagnosis not present

## 2019-03-26 DIAGNOSIS — E039 Hypothyroidism, unspecified: Secondary | ICD-10-CM | POA: Diagnosis not present

## 2019-04-19 DIAGNOSIS — I10 Essential (primary) hypertension: Secondary | ICD-10-CM | POA: Diagnosis not present

## 2019-04-19 DIAGNOSIS — Z6829 Body mass index (BMI) 29.0-29.9, adult: Secondary | ICD-10-CM | POA: Diagnosis not present

## 2019-05-07 DIAGNOSIS — E782 Mixed hyperlipidemia: Secondary | ICD-10-CM | POA: Diagnosis not present

## 2019-05-07 DIAGNOSIS — Z6829 Body mass index (BMI) 29.0-29.9, adult: Secondary | ICD-10-CM | POA: Diagnosis not present

## 2019-05-07 DIAGNOSIS — E559 Vitamin D deficiency, unspecified: Secondary | ICD-10-CM | POA: Diagnosis not present

## 2019-05-31 DIAGNOSIS — E559 Vitamin D deficiency, unspecified: Secondary | ICD-10-CM | POA: Diagnosis not present

## 2019-05-31 DIAGNOSIS — E782 Mixed hyperlipidemia: Secondary | ICD-10-CM | POA: Diagnosis not present

## 2019-05-31 DIAGNOSIS — Z6828 Body mass index (BMI) 28.0-28.9, adult: Secondary | ICD-10-CM | POA: Diagnosis not present

## 2019-06-14 DIAGNOSIS — E782 Mixed hyperlipidemia: Secondary | ICD-10-CM | POA: Diagnosis not present

## 2019-06-14 DIAGNOSIS — I10 Essential (primary) hypertension: Secondary | ICD-10-CM | POA: Diagnosis not present

## 2019-06-14 DIAGNOSIS — Z6828 Body mass index (BMI) 28.0-28.9, adult: Secondary | ICD-10-CM | POA: Diagnosis not present

## 2019-06-15 ENCOUNTER — Other Ambulatory Visit (HOSPITAL_COMMUNITY)
Admission: RE | Admit: 2019-06-15 | Discharge: 2019-06-15 | Disposition: A | Discharge status: Home / Self Care | Payer: BC Managed Care – PPO | Source: Ambulatory Visit | Attending: Family Medicine | Admitting: Family Medicine

## 2019-06-15 ENCOUNTER — Other Ambulatory Visit: Payer: Self-pay | Admitting: Family Medicine

## 2019-06-15 DIAGNOSIS — Z124 Encounter for screening for malignant neoplasm of cervix: Secondary | ICD-10-CM | POA: Diagnosis not present

## 2019-06-15 DIAGNOSIS — Z9884 Bariatric surgery status: Secondary | ICD-10-CM | POA: Diagnosis not present

## 2019-06-15 DIAGNOSIS — I10 Essential (primary) hypertension: Secondary | ICD-10-CM | POA: Diagnosis not present

## 2019-06-15 DIAGNOSIS — E039 Hypothyroidism, unspecified: Secondary | ICD-10-CM | POA: Diagnosis not present

## 2019-06-15 DIAGNOSIS — E7800 Pure hypercholesterolemia, unspecified: Secondary | ICD-10-CM | POA: Diagnosis not present

## 2019-06-15 DIAGNOSIS — Z01411 Encounter for gynecological examination (general) (routine) with abnormal findings: Secondary | ICD-10-CM | POA: Insufficient documentation

## 2019-06-15 DIAGNOSIS — Z0000 Encounter for general adult medical examination without abnormal findings: Secondary | ICD-10-CM | POA: Diagnosis not present

## 2019-06-15 DIAGNOSIS — E119 Type 2 diabetes mellitus without complications: Secondary | ICD-10-CM | POA: Diagnosis not present

## 2019-06-15 DIAGNOSIS — E559 Vitamin D deficiency, unspecified: Secondary | ICD-10-CM | POA: Diagnosis not present

## 2019-06-17 LAB — CYTOLOGY - PAP
Comment: NEGATIVE
Diagnosis: NEGATIVE
High risk HPV: NEGATIVE

## 2019-06-29 DIAGNOSIS — E559 Vitamin D deficiency, unspecified: Secondary | ICD-10-CM | POA: Diagnosis not present

## 2019-06-29 DIAGNOSIS — Z6828 Body mass index (BMI) 28.0-28.9, adult: Secondary | ICD-10-CM | POA: Diagnosis not present

## 2019-07-28 DIAGNOSIS — Z6829 Body mass index (BMI) 29.0-29.9, adult: Secondary | ICD-10-CM | POA: Diagnosis not present

## 2019-07-28 DIAGNOSIS — E782 Mixed hyperlipidemia: Secondary | ICD-10-CM | POA: Diagnosis not present

## 2019-08-16 DIAGNOSIS — E039 Hypothyroidism, unspecified: Secondary | ICD-10-CM | POA: Diagnosis not present

## 2019-08-16 DIAGNOSIS — Z6828 Body mass index (BMI) 28.0-28.9, adult: Secondary | ICD-10-CM | POA: Diagnosis not present

## 2019-09-16 DIAGNOSIS — E782 Mixed hyperlipidemia: Secondary | ICD-10-CM | POA: Diagnosis not present

## 2019-09-16 DIAGNOSIS — Z6829 Body mass index (BMI) 29.0-29.9, adult: Secondary | ICD-10-CM | POA: Diagnosis not present

## 2019-09-24 DIAGNOSIS — F509 Eating disorder, unspecified: Secondary | ICD-10-CM | POA: Diagnosis not present

## 2019-10-07 DIAGNOSIS — Z6829 Body mass index (BMI) 29.0-29.9, adult: Secondary | ICD-10-CM | POA: Diagnosis not present

## 2019-10-07 DIAGNOSIS — I10 Essential (primary) hypertension: Secondary | ICD-10-CM | POA: Diagnosis not present

## 2019-10-07 DIAGNOSIS — E063 Autoimmune thyroiditis: Secondary | ICD-10-CM | POA: Diagnosis not present

## 2019-10-22 DIAGNOSIS — J0110 Acute frontal sinusitis, unspecified: Secondary | ICD-10-CM | POA: Diagnosis not present

## 2019-10-22 DIAGNOSIS — F509 Eating disorder, unspecified: Secondary | ICD-10-CM | POA: Diagnosis not present

## 2019-10-24 DIAGNOSIS — Z20828 Contact with and (suspected) exposure to other viral communicable diseases: Secondary | ICD-10-CM | POA: Diagnosis not present

## 2019-10-29 DIAGNOSIS — E663 Overweight: Secondary | ICD-10-CM | POA: Diagnosis not present

## 2019-11-02 DIAGNOSIS — L219 Seborrheic dermatitis, unspecified: Secondary | ICD-10-CM | POA: Diagnosis not present

## 2019-11-02 DIAGNOSIS — L853 Xerosis cutis: Secondary | ICD-10-CM | POA: Diagnosis not present

## 2019-11-18 DIAGNOSIS — Z6828 Body mass index (BMI) 28.0-28.9, adult: Secondary | ICD-10-CM | POA: Diagnosis not present

## 2019-11-18 DIAGNOSIS — E782 Mixed hyperlipidemia: Secondary | ICD-10-CM | POA: Diagnosis not present

## 2019-12-02 DIAGNOSIS — Z6828 Body mass index (BMI) 28.0-28.9, adult: Secondary | ICD-10-CM | POA: Diagnosis not present

## 2019-12-02 DIAGNOSIS — E559 Vitamin D deficiency, unspecified: Secondary | ICD-10-CM | POA: Diagnosis not present

## 2019-12-22 ENCOUNTER — Other Ambulatory Visit: Payer: Self-pay | Admitting: Family Medicine

## 2019-12-22 DIAGNOSIS — Z1231 Encounter for screening mammogram for malignant neoplasm of breast: Secondary | ICD-10-CM

## 2020-01-26 DIAGNOSIS — F4322 Adjustment disorder with anxiety: Secondary | ICD-10-CM | POA: Diagnosis not present

## 2020-01-26 DIAGNOSIS — K909 Intestinal malabsorption, unspecified: Secondary | ICD-10-CM | POA: Diagnosis not present

## 2020-01-26 DIAGNOSIS — J309 Allergic rhinitis, unspecified: Secondary | ICD-10-CM | POA: Diagnosis not present

## 2020-01-26 DIAGNOSIS — E7800 Pure hypercholesterolemia, unspecified: Secondary | ICD-10-CM | POA: Diagnosis not present

## 2020-01-27 ENCOUNTER — Other Ambulatory Visit: Payer: Self-pay

## 2020-01-27 ENCOUNTER — Ambulatory Visit
Admission: RE | Admit: 2020-01-27 | Discharge: 2020-01-27 | Disposition: A | Discharge status: Home / Self Care | Payer: BC Managed Care – PPO | Source: Ambulatory Visit | Attending: Family Medicine | Admitting: Family Medicine

## 2020-01-27 DIAGNOSIS — Z1231 Encounter for screening mammogram for malignant neoplasm of breast: Secondary | ICD-10-CM | POA: Diagnosis not present

## 2020-03-30 DIAGNOSIS — H40013 Open angle with borderline findings, low risk, bilateral: Secondary | ICD-10-CM | POA: Diagnosis not present

## 2020-05-25 DIAGNOSIS — I10 Essential (primary) hypertension: Secondary | ICD-10-CM | POA: Diagnosis not present

## 2020-05-25 DIAGNOSIS — Z6835 Body mass index (BMI) 35.0-35.9, adult: Secondary | ICD-10-CM | POA: Diagnosis not present

## 2020-06-12 DIAGNOSIS — R7989 Other specified abnormal findings of blood chemistry: Secondary | ICD-10-CM | POA: Diagnosis not present

## 2020-06-12 DIAGNOSIS — Z6834 Body mass index (BMI) 34.0-34.9, adult: Secondary | ICD-10-CM | POA: Diagnosis not present

## 2020-06-13 DIAGNOSIS — F411 Generalized anxiety disorder: Secondary | ICD-10-CM | POA: Diagnosis not present

## 2020-06-16 DIAGNOSIS — E039 Hypothyroidism, unspecified: Secondary | ICD-10-CM | POA: Diagnosis not present

## 2020-06-16 DIAGNOSIS — E7800 Pure hypercholesterolemia, unspecified: Secondary | ICD-10-CM | POA: Diagnosis not present

## 2020-06-16 DIAGNOSIS — E119 Type 2 diabetes mellitus without complications: Secondary | ICD-10-CM | POA: Diagnosis not present

## 2020-06-16 DIAGNOSIS — D509 Iron deficiency anemia, unspecified: Secondary | ICD-10-CM | POA: Diagnosis not present

## 2020-06-16 DIAGNOSIS — E559 Vitamin D deficiency, unspecified: Secondary | ICD-10-CM | POA: Diagnosis not present

## 2020-06-16 DIAGNOSIS — Z9884 Bariatric surgery status: Secondary | ICD-10-CM | POA: Diagnosis not present

## 2020-06-23 DIAGNOSIS — Z0000 Encounter for general adult medical examination without abnormal findings: Secondary | ICD-10-CM | POA: Diagnosis not present

## 2020-06-27 DIAGNOSIS — F411 Generalized anxiety disorder: Secondary | ICD-10-CM | POA: Diagnosis not present

## 2020-07-17 DIAGNOSIS — F4322 Adjustment disorder with anxiety: Secondary | ICD-10-CM | POA: Diagnosis not present

## 2020-07-17 DIAGNOSIS — I10 Essential (primary) hypertension: Secondary | ICD-10-CM | POA: Diagnosis not present

## 2020-07-25 DIAGNOSIS — F411 Generalized anxiety disorder: Secondary | ICD-10-CM | POA: Diagnosis not present

## 2020-09-21 DIAGNOSIS — R635 Abnormal weight gain: Secondary | ICD-10-CM | POA: Diagnosis not present

## 2020-09-25 DIAGNOSIS — Z20822 Contact with and (suspected) exposure to covid-19: Secondary | ICD-10-CM | POA: Diagnosis not present

## 2020-10-01 DIAGNOSIS — Z1389 Encounter for screening for other disorder: Secondary | ICD-10-CM | POA: Diagnosis not present

## 2020-11-08 DIAGNOSIS — F419 Anxiety disorder, unspecified: Secondary | ICD-10-CM | POA: Diagnosis not present

## 2020-11-08 DIAGNOSIS — F329 Major depressive disorder, single episode, unspecified: Secondary | ICD-10-CM | POA: Diagnosis not present

## 2020-11-21 DIAGNOSIS — H40003 Preglaucoma, unspecified, bilateral: Secondary | ICD-10-CM | POA: Diagnosis not present

## 2020-12-18 ENCOUNTER — Other Ambulatory Visit: Payer: Self-pay | Admitting: Family Medicine

## 2020-12-18 DIAGNOSIS — Z1231 Encounter for screening mammogram for malignant neoplasm of breast: Secondary | ICD-10-CM

## 2021-01-06 ENCOUNTER — Emergency Department (HOSPITAL_COMMUNITY)
Admission: EM | Admit: 2021-01-06 | Discharge: 2021-01-06 | Disposition: A | Discharge status: Home / Self Care | Payer: BC Managed Care – PPO | Attending: Emergency Medicine | Admitting: Emergency Medicine

## 2021-01-06 ENCOUNTER — Encounter (HOSPITAL_COMMUNITY): Payer: Self-pay

## 2021-01-06 ENCOUNTER — Emergency Department (HOSPITAL_COMMUNITY): Payer: BC Managed Care – PPO

## 2021-01-06 DIAGNOSIS — E119 Type 2 diabetes mellitus without complications: Secondary | ICD-10-CM | POA: Diagnosis not present

## 2021-01-06 DIAGNOSIS — M79605 Pain in left leg: Secondary | ICD-10-CM | POA: Diagnosis not present

## 2021-01-06 DIAGNOSIS — I10 Essential (primary) hypertension: Secondary | ICD-10-CM | POA: Diagnosis not present

## 2021-01-06 DIAGNOSIS — W109XXA Fall (on) (from) unspecified stairs and steps, initial encounter: Secondary | ICD-10-CM | POA: Insufficient documentation

## 2021-01-06 DIAGNOSIS — Y93K1 Activity, walking an animal: Secondary | ICD-10-CM | POA: Diagnosis not present

## 2021-01-06 DIAGNOSIS — Z79899 Other long term (current) drug therapy: Secondary | ICD-10-CM | POA: Insufficient documentation

## 2021-01-06 DIAGNOSIS — W19XXXA Unspecified fall, initial encounter: Secondary | ICD-10-CM

## 2021-01-06 DIAGNOSIS — R519 Headache, unspecified: Secondary | ICD-10-CM | POA: Diagnosis not present

## 2021-01-06 DIAGNOSIS — H53149 Visual discomfort, unspecified: Secondary | ICD-10-CM | POA: Insufficient documentation

## 2021-01-06 DIAGNOSIS — M542 Cervicalgia: Secondary | ICD-10-CM | POA: Diagnosis not present

## 2021-01-06 LAB — BASIC METABOLIC PANEL
Anion gap: 7 (ref 5–15)
BUN: 17 mg/dL (ref 6–20)
CO2: 22 mmol/L (ref 22–32)
Calcium: 8.8 mg/dL — ABNORMAL LOW (ref 8.9–10.3)
Chloride: 105 mmol/L (ref 98–111)
Creatinine, Ser: 0.83 mg/dL (ref 0.44–1.00)
GFR, Estimated: >60 mL/min (ref >60)
Glucose, Bld: 79 mg/dL (ref 70–99)
Potassium: 3.8 mmol/L (ref 3.5–5.1)
Sodium: 134 mmol/L — ABNORMAL LOW (ref 135–145)

## 2021-01-06 LAB — I-STAT BETA HCG BLOOD, ED (MC, WL, AP ONLY): I-stat hCG, quantitative: <5 m[IU]/mL (ref <5)

## 2021-01-06 LAB — CBC
HCT: 42 % (ref 36.0–46.0)
Hemoglobin: 13 g/dL (ref 12.0–15.0)
MCH: 25 pg — ABNORMAL LOW (ref 26.0–34.0)
MCHC: 31 g/dL (ref 30.0–36.0)
MCV: 80.9 fL (ref 80.0–100.0)
Platelets: 219 10*3/uL (ref 150–400)
RBC: 5.19 MIL/uL — ABNORMAL HIGH (ref 3.87–5.11)
RDW: 15.6 % — ABNORMAL HIGH (ref 11.5–15.5)
WBC: 3.7 10*3/uL — ABNORMAL LOW (ref 4.0–10.5)
nRBC: 0 % (ref 0.0–0.2)

## 2021-01-06 MED ORDER — OXYCODONE-ACETAMINOPHEN 5-325 MG PO TABS
1.0000 | ORAL_TABLET | Freq: Once | ORAL | Status: AC
  Administered 2021-01-06: 1 via ORAL
  Filled 2021-01-06: qty 1

## 2021-01-06 MED ORDER — KETOROLAC TROMETHAMINE 30 MG/ML IJ SOLN
30.0000 mg | Freq: Once | INTRAMUSCULAR | Status: AC
  Administered 2021-01-06: 30 mg via INTRAMUSCULAR
  Filled 2021-01-06: qty 1

## 2021-01-06 NOTE — Discharge Instructions (Addendum)
Were seen here today for worsening head pain after a fall.  The CT of your head and neck were normal.  This is likely a migraine triggered by your fall. You can take Tylenol and/or Ibuprofen as needed for pain. Additional information on migraines, such as brain rest, avoiding bright lights, included in your discharge paperwork. If you have any concern, new or worsening symptoms, please return to the nearest emergency department.

## 2021-01-06 NOTE — ED Provider Notes (Signed)
[facility] DEPT Provider Note   CSN: [id number] Arrival date & time: 01/06/21  1125     History Chief Complaint  Patient presents with   Cynthia Vazquez    Kam Kushnir is a 50 y.o. female presents to the ED for evaluation of headache, head pain, and neck pain since a fall 5 days ago. The patient reports that she was walking her dog when she stepped on leaves and slipped backwards, hitting her head. She denied any LOC.  Denies any blurry vision, chest pain, shortness of breath, tinnitus.  Reports some photophobia that is usually present with her migraines.  She complains of worsening head pain. She has a h/o migraine headaches and reports this feels slightly worse than her typical migraines.  Medical history includes hypertension, hypothyroidism, anxiety, and depression.  No surgical history.  Medications include levothyroxine, "an antidepressant", lisinopril, and hydrochlorothiazide.  No known drug allergies.  Denies any tobacco, EtOH, or drug use. Patient reports she did not take her pain medication today.    Fall Associated symptoms include headaches. Pertinent negatives include no chest pain, no abdominal pain and no shortness of breath.      Past Medical History:  Diagnosis Date   Diabetes mellitus without complication ([place])    History of hiatal hernia    Hypertension    Iron deficiency anemia due to chronic blood loss    menstrual cycles    Patient Active Problem List   Diagnosis Date Noted   Other fatigue 01/21/2017   B12 deficiency 08/08/2016   Vitamin D deficiency 08/08/2016   Leukopenia 08/08/2016   HTN (hypertension) 11/07/2014   Prediabetes 11/07/2014   Hiatal hernia 11/07/2014   Lumbar and sacral osteoarthritis 11/07/2014   S/P laparoscopic sleeve gastrectomy 11/07/2014   Morbid obesity with BMI of 50.0-59.9, adult ([place]) 05/09/2014   Headache 09/16/2013   Thalassemia trait 08/19/2013   Iron deficiency anemia due to chronic blood loss  07/14/2013   Thrombocytosis 07/14/2013   Menorrhagia 07/14/2013    Past Surgical History:  Procedure Laterality Date   BREATH TEK H PYLORI N/A 07/22/2014   Procedure: BREATH TEK H PYLORI;  Surgeon: Greer Pickerel, MD;  Location: [facility];  Service: General;  Laterality: N/A;   [place] N/A 11/07/2014   Procedure: LAPAROSCOPIC GASTRIC SLEEVE RESECTION HIATAL HERNIA  REPAIR/ UPPER ENDO;  Surgeon: Greer Pickerel, MD;  Location: [facility];  Service: General;  Laterality: N/A;   TUBAL LIGATION     UPPER GI ENDOSCOPY  11/07/2014   Procedure: UPPER GI ENDOSCOPY;  Surgeon: Greer Pickerel, MD;  Location: [facility];  Service: General;;     OB History   No obstetric history on file.     Family History  Problem Relation Age of Onset   Anemia Mother    Anemia Paternal Grandfather    Breast cancer Neg Hx     Social History   Tobacco Use   Smoking status: Never   Smokeless tobacco: Never  Substance Use Topics   Alcohol use: No   Drug use: No    Home Medications Prior to Admission medications   Medication Sig Start Date End Date Taking? Authorizing Provider  levothyroxine (SYNTHROID, LEVOTHROID) 50 MCG tablet Take 50 mcg by mouth daily before breakfast.    [provider]  lisinopril-hydrochlorothiazide (PRINZIDE,ZESTORETIC) 10-12.5 MG tablet Take 1 tablet by mouth daily. 05/29/16   [provider]  loratadine (CLARITIN) 10 MG  tablet Take 10 mg by mouth daily as needed for allergies.     [provider]  Multiple Vitamins-Minerals (OPTISOURCE POST BARIATRIC SURG) CHEW Chew 2 tablets by mouth 2 (two) times daily.    [provider]  ondansetron (ZOFRAN ODT) 4 MG disintegrating tablet Take 1 tablet (4 mg total) by mouth every 8 (eight) hours as needed for nausea or vomiting. 03/09/18   Charlann Lange, PA-C    Allergies    Shellfish allergy and Augmentin [amoxicillin-pot clavulanate]  Review of  Systems   Review of Systems  Constitutional:  Negative for chills and fever.  HENT:  Negative for congestion, ear pain, nosebleeds and sore throat.   Eyes:  Positive for photophobia. Negative for pain, redness and visual disturbance.  Respiratory:  Negative for cough and shortness of breath.   Cardiovascular:  Negative for chest pain and palpitations.  Gastrointestinal:  Negative for abdominal pain, constipation, diarrhea, nausea and vomiting.  Genitourinary:  Negative for dysuria and hematuria.  Musculoskeletal:  Positive for neck pain. Negative for arthralgias and back pain.  Skin:  Negative for color change and rash.  Neurological:  Positive for light-headedness and headaches. Negative for seizures, syncope and weakness.  All other systems reviewed and are negative.  Physical Exam Updated Vital Signs BP (!) 153/98 (BP Location: Right Arm)   Pulse 65   Temp 98.6 F (37 C) (Oral)   Resp 18   SpO2 96%   Physical Exam Vitals and nursing note reviewed.  Constitutional:      General: She is not in acute distress.    Appearance: Normal appearance. She is not ill-appearing or toxic-appearing.  HENT:     Head: Normocephalic and atraumatic.     Comments: No step-offs or deformities noted.  No overlying skin changes noted.    Right Ear: Tympanic membrane, ear canal and external ear normal.     Left Ear: Tympanic membrane, ear canal and external ear normal.     Nose: Nose normal.  Eyes:     General: No scleral icterus.       Right eye: No discharge.        Left eye: No discharge.     Extraocular Movements: Extraocular movements intact.     Conjunctiva/sclera: Conjunctivae normal.     Pupils: Pupils are equal, round, and reactive to light.  Neck:     Comments: Mild midline tenderness palpation.  Bilateral paraspinal tenderness palpation.  No obvious step-offs or deformities.  Full range of motion. Cardiovascular:     Rate and Rhythm: Normal rate and regular rhythm.  Pulmonary:      Effort: Pulmonary effort is normal. No respiratory distress.     Breath sounds: Normal breath sounds.  Abdominal:     General: Abdomen is flat. Bowel sounds are normal.     Palpations: Abdomen is soft.     Tenderness: There is no guarding or rebound.  Musculoskeletal:        General: No deformity.     Cervical back: Normal range of motion. Tenderness present. No rigidity.     Right lower leg: No edema.     Left lower leg: No edema.  Lymphadenopathy:     Cervical: No cervical adenopathy.  Skin:    General: Skin is warm and dry.  Neurological:     General: No focal deficit present.     Mental Status: She is alert. Mental status is at baseline.    ED Results / Procedures / Treatments  Labs (all labs ordered are listed, but only abnormal results are displayed) Labs Reviewed  CBC - Abnormal; Notable for the following components:      Result Value   WBC 3.7 (*)    RBC 5.19 (*)    MCH 25.0 (*)    RDW 15.6 (*)    All other components within normal limits  BASIC METABOLIC PANEL  I-STAT BETA HCG BLOOD, ED ([facility], [facility], [facility] ONLY)    EKG None  Radiology CT Head Wo Contrast  Result Date: 01/06/2021 CLINICAL DATA:  Head trauma.  Headache and posterior neck pain EXAM: CT HEAD WITHOUT CONTRAST TECHNIQUE: Contiguous axial images were obtained from the base of the skull through the vertex without intravenous contrast. COMPARISON:  None. FINDINGS: Brain: No acute intracranial hemorrhage. No focal mass lesion. No CT evidence of acute infarction. No midline shift or mass effect. No hydrocephalus. Basilar cisterns are patent. Vascular: No hyperdense vessel or unexpected calcification. Skull: Normal. Negative for fracture or focal lesion. Sinuses/Orbits: Paranasal sinuses and mastoid air cells are clear. Orbits are clear. Other: None. IMPRESSION: No acute intracranial findings.  No intracranial trauma. Electronically Signed   By: Suzy Bouchard M.D.   On: 01/06/2021 13:29   CT Cervical Spine Wo  Contrast  Result Date: 01/06/2021 CLINICAL DATA:  Fall.  Struck head.  Neck pain EXAM: CT HEAD WITHOUT CONTRAST CT CERVICAL SPINE WITHOUT CONTRAST TECHNIQUE: Multidetector CT imaging of the head and cervical spine was performed following the standard protocol without intravenous contrast. Multiplanar CT image reconstructions of the cervical spine were also generated. COMPARISON:  None. FINDINGS: CT HEAD FINDINGS Brain: No acute intracranial hemorrhage. No focal mass lesion. No CT evidence of acute infarction. No midline shift or mass effect. No hydrocephalus. Basilar cisterns are patent. Vascular: No hyperdense vessel or unexpected calcification. Skull: Normal. Negative for fracture or focal lesion. Sinuses/Orbits: Paranasal sinuses and mastoid air cells are clear. Orbits are clear. Other: None. CT CERVICAL SPINE FINDINGS Alignment: Normal alignment of the cervical vertebral bodies. Skull base and vertebrae: Normal craniocervical junction. No loss of vertebral body height or disc height. Normal facet articulation. No evidence of fracture. Soft tissues and spinal canal: No prevertebral soft tissue swelling. No perispinal or epidural hematoma. Disc levels:  Unremarkable Upper chest: Clear Other: None IMPRESSION: 1. No intracranial trauma. 2. No cervical spine fracture Electronically Signed   By: Suzy Bouchard M.D.   On: 01/06/2021 13:00    Procedures Procedures   Medications Ordered in ED Medications  oxyCODONE-acetaminophen (PERCOCET/ROXICET) 5-325 MG per tablet 1 tablet (1 tablet Oral Given 01/06/21 1335)  ketorolac (TORADOL) 30 MG/ML injection 30 mg (30 mg Intramuscular Given 01/06/21 1335)    ED Course  I have reviewed the triage vital signs and the nursing notes.  Pertinent labs & imaging results that were available during my care of the patient were reviewed by me and considered in my medical decision making (see chart for details).  50 year old female presents emergency department for  evaluation of left leg pain after a slip and fall backwards 5 days ago.  Differential diagnosis includes concussion, migraine, sprain bleed, C-spine fracture, neck sprain.  I personally reviewed the patient's labs and imaging.  CBC shows mild to PT at 3.7, consistent with patient's labs over the previous years.  Negative hCG.  BMP shows sodium 134 can be replenished outpatient, otherwise normal.  CT Head & Neck shows no intracranial trauma or cervical spine fracture.  The patient was given Toradol and Percocet for migraine  headache.  Patient reports her husband will be driving home.  On reevaluation, patient reports her pain has improved significantly with the Percocet and Toradol.  Lab and imaging findings discussed with patient.  His blood pressure is mildly elevated, patient ports she did not take her blood pressure medicine before arriving today.  She promised me she would take it when she got home.  Recommended she take Tylenol or ibuprofen as needed for pain.  Strict return precautions discussed.  Patient agrees to plan.  Patient is stable being discharged home in good condition.    MDM Rules/Calculators/A&P                          Final Clinical Impression(s) / ED Diagnoses Final diagnoses:  Fall, initial encounter  Acute nonintractable headache, unspecified headache type    Rx / DC Orders ED Discharge Orders     None        Sherrell Puller, PA-C 01/06/21 [place], MD 01/08/21 [phone]

## 2021-01-06 NOTE — ED Triage Notes (Signed)
Pt states she was walking her dog Monday night and slipped and fell over some wet leaves. Pt states she landed on her back and did strike her head on the concrete. No loc, no blood thinners. Pt is alert and oriented x4. Reports worsening headache.

## 2021-01-10 DIAGNOSIS — F329 Major depressive disorder, single episode, unspecified: Secondary | ICD-10-CM | POA: Diagnosis not present

## 2021-01-10 DIAGNOSIS — Z6836 Body mass index (BMI) 36.0-36.9, adult: Secondary | ICD-10-CM | POA: Diagnosis not present

## 2021-01-29 ENCOUNTER — Ambulatory Visit
Admission: RE | Admit: 2021-01-29 | Discharge: 2021-01-29 | Disposition: A | Discharge status: Home / Self Care | Payer: BC Managed Care – PPO | Source: Ambulatory Visit | Attending: Family Medicine | Admitting: Family Medicine

## 2021-01-29 ENCOUNTER — Ambulatory Visit: Payer: BC Managed Care – PPO

## 2021-01-29 DIAGNOSIS — Z1231 Encounter for screening mammogram for malignant neoplasm of breast: Secondary | ICD-10-CM

## 2021-03-07 DIAGNOSIS — Z713 Dietary counseling and surveillance: Secondary | ICD-10-CM | POA: Diagnosis not present

## 2021-03-21 DIAGNOSIS — Z713 Dietary counseling and surveillance: Secondary | ICD-10-CM | POA: Diagnosis not present

## 2021-04-04 DIAGNOSIS — Z713 Dietary counseling and surveillance: Secondary | ICD-10-CM | POA: Diagnosis not present

## 2021-04-19 DIAGNOSIS — Z713 Dietary counseling and surveillance: Secondary | ICD-10-CM | POA: Diagnosis not present

## 2021-05-10 DIAGNOSIS — Z713 Dietary counseling and surveillance: Secondary | ICD-10-CM | POA: Diagnosis not present

## 2021-05-24 DIAGNOSIS — Z713 Dietary counseling and surveillance: Secondary | ICD-10-CM | POA: Diagnosis not present

## 2021-06-07 DIAGNOSIS — Z713 Dietary counseling and surveillance: Secondary | ICD-10-CM | POA: Diagnosis not present

## 2021-07-04 DIAGNOSIS — E559 Vitamin D deficiency, unspecified: Secondary | ICD-10-CM | POA: Diagnosis not present

## 2021-07-04 DIAGNOSIS — E119 Type 2 diabetes mellitus without complications: Secondary | ICD-10-CM | POA: Diagnosis not present

## 2021-07-04 DIAGNOSIS — E039 Hypothyroidism, unspecified: Secondary | ICD-10-CM | POA: Diagnosis not present

## 2021-07-04 DIAGNOSIS — Z9884 Bariatric surgery status: Secondary | ICD-10-CM | POA: Diagnosis not present

## 2021-07-04 DIAGNOSIS — E7800 Pure hypercholesterolemia, unspecified: Secondary | ICD-10-CM | POA: Diagnosis not present

## 2021-07-04 DIAGNOSIS — Z0000 Encounter for general adult medical examination without abnormal findings: Secondary | ICD-10-CM | POA: Diagnosis not present

## 2021-07-04 DIAGNOSIS — Z79899 Other long term (current) drug therapy: Secondary | ICD-10-CM | POA: Diagnosis not present

## 2021-07-12 DIAGNOSIS — Z713 Dietary counseling and surveillance: Secondary | ICD-10-CM | POA: Diagnosis not present

## 2021-07-26 DIAGNOSIS — Z713 Dietary counseling and surveillance: Secondary | ICD-10-CM | POA: Diagnosis not present

## 2021-08-09 DIAGNOSIS — Z713 Dietary counseling and surveillance: Secondary | ICD-10-CM | POA: Diagnosis not present

## 2021-08-14 ENCOUNTER — Other Ambulatory Visit: Payer: Self-pay

## 2021-08-14 ENCOUNTER — Encounter (HOSPITAL_COMMUNITY): Payer: Self-pay

## 2021-08-14 ENCOUNTER — Emergency Department (HOSPITAL_COMMUNITY)
Admission: EM | Admit: 2021-08-14 | Discharge: 2021-08-15 | Disposition: A | Discharge status: Home / Self Care | Payer: BC Managed Care – PPO | Attending: Emergency Medicine | Admitting: Emergency Medicine

## 2021-08-14 DIAGNOSIS — K429 Umbilical hernia without obstruction or gangrene: Secondary | ICD-10-CM | POA: Diagnosis not present

## 2021-08-14 DIAGNOSIS — R1031 Right lower quadrant pain: Secondary | ICD-10-CM | POA: Diagnosis not present

## 2021-08-14 DIAGNOSIS — R519 Headache, unspecified: Secondary | ICD-10-CM | POA: Diagnosis not present

## 2021-08-14 LAB — CBC WITH DIFFERENTIAL/PLATELET
Abs Immature Granulocytes: 0 10*3/uL (ref 0.00–0.07)
Basophils Absolute: 0.1 10*3/uL (ref 0.0–0.1)
Basophils Relative: 1 %
Eosinophils Absolute: 0.2 10*3/uL (ref 0.0–0.5)
Eosinophils Relative: 4 %
HCT: 44.6 % (ref 36.0–46.0)
Hemoglobin: 13.5 g/dL (ref 12.0–15.0)
Immature Granulocytes: 0 %
Lymphocytes Relative: 40 %
Lymphs Abs: 1.7 10*3/uL (ref 0.7–4.0)
MCH: 26.2 pg (ref 26.0–34.0)
MCHC: 30.3 g/dL (ref 30.0–36.0)
MCV: 86.6 fL (ref 80.0–100.0)
Monocytes Absolute: 0.5 10*3/uL (ref 0.1–1.0)
Monocytes Relative: 12 %
Neutro Abs: 1.9 10*3/uL (ref 1.7–7.7)
Neutrophils Relative %: 43 %
Platelets: 192 10*3/uL (ref 150–400)
RBC: 5.15 MIL/uL — ABNORMAL HIGH (ref 3.87–5.11)
RDW: 14 % (ref 11.5–15.5)
WBC: 4.4 10*3/uL (ref 4.0–10.5)
nRBC: 0 % (ref 0.0–0.2)

## 2021-08-14 LAB — COMPREHENSIVE METABOLIC PANEL
ALT: 26 U/L (ref 0–44)
AST: 34 U/L (ref 15–41)
Albumin: 4 g/dL (ref 3.5–5.0)
Alkaline Phosphatase: 76 U/L (ref 38–126)
Anion gap: 10 (ref 5–15)
BUN: 17 mg/dL (ref 6–20)
CO2: 21 mmol/L — ABNORMAL LOW (ref 22–32)
Calcium: 9.5 mg/dL (ref 8.9–10.3)
Chloride: 108 mmol/L (ref 98–111)
Creatinine, Ser: 0.79 mg/dL (ref 0.44–1.00)
GFR, Estimated: >60 mL/min (ref >60)
Glucose, Bld: 85 mg/dL (ref 70–99)
Potassium: 3.9 mmol/L (ref 3.5–5.1)
Sodium: 139 mmol/L (ref 135–145)
Total Bilirubin: 0.6 mg/dL (ref 0.3–1.2)
Total Protein: 7.6 g/dL (ref 6.5–8.1)

## 2021-08-14 LAB — URINALYSIS, ROUTINE W REFLEX MICROSCOPIC
Bilirubin Urine: NEGATIVE
Glucose, UA: NEGATIVE mg/dL
Hgb urine dipstick: NEGATIVE
Ketones, ur: NEGATIVE mg/dL
Leukocytes,Ua: NEGATIVE
Nitrite: NEGATIVE
Protein, ur: NEGATIVE mg/dL
Specific Gravity, Urine: 1.012 (ref 1.005–1.030)
pH: 6 (ref 5.0–8.0)

## 2021-08-14 LAB — LIPASE, BLOOD: Lipase: 23 U/L (ref 11–51)

## 2021-08-14 LAB — I-STAT BETA HCG BLOOD, ED (MC, WL, AP ONLY): I-stat hCG, quantitative: <5 m[IU]/mL (ref <5)

## 2021-08-14 MED ORDER — ONDANSETRON HCL 4 MG/2ML IJ SOLN
4.0000 mg | Freq: Once | INTRAMUSCULAR | Status: AC
  Administered 2021-08-14: 4 mg via INTRAVENOUS
  Filled 2021-08-14: qty 2

## 2021-08-14 MED ORDER — SODIUM CHLORIDE 0.9 % IV BOLUS
1000.0000 mL | Freq: Once | INTRAVENOUS | Status: AC
  Administered 2021-08-14: 1000 mL via INTRAVENOUS

## 2021-08-14 MED ORDER — HYDROMORPHONE HCL 1 MG/ML IJ SOLN
1.0000 mg | Freq: Once | INTRAMUSCULAR | Status: AC
  Administered 2021-08-14: 1 mg via INTRAVENOUS
  Filled 2021-08-14: qty 1

## 2021-08-14 NOTE — ED Triage Notes (Signed)
Pt reports with LLQ abdominal pain and headache x 3 days. Pt states that the pain is sharp and will not go away.

## 2021-08-14 NOTE — ED Notes (Signed)
Patient decided to go back to the waiting room due to the room being smelly.

## 2021-08-14 NOTE — ED Provider Notes (Incomplete)
[facility] Emergency Department Provider Note MRN:  [id number]  Arrival date & time: 08/15/21     Chief Complaint   Abdominal Pain and Headache   History of Present Illness   Cynthia Vazquez is a 51 y.o. year-old female presents to the ED with chief complaint of abdominal pain and headache.  She has hx of migraines and states that this feels like her typical migraine.  She states that the abdominal pain is in the RLQ and has been gradually worsening over the past 3 days.  She has prior bariatric surgery.  She denies nausea, vomiting, or diarrhea.  Denies dysuria.    History provided by patient.   Review of Systems  Pertinent review of systems noted in HPI.    Physical Exam   Vitals:   08/14/21 2230 08/15/21 0045  BP: 117/71 (!) 115/59  Pulse: 62 73  Resp: 18 13  Temp:    SpO2: 100% 98%    CONSTITUTIONAL:  non toxic-appearing, NAD NEURO:  Alert and oriented x 3, CN 3-12 grossly intact EYES:  eyes equal and reactive ENT/NECK:  Supple, no stridor  CARDIO:  normal rate, regular rhythm, appears well-perfused  PULM:  No respiratory distress, CTAB GI/GU:  non-distended, RLQ tenderness MSK/SPINE:  No gross deformities, no edema, moves all extremities  SKIN:  no rash, atraumatic   *Additional and/or pertinent findings included in MDM below  Diagnostic and Interventional Summary    EKG Interpretation  Date/Time:    Ventricular Rate:    PR Interval:    QRS Duration:   QT Interval:    QTC Calculation:   R Axis:     Text Interpretation:         Labs Reviewed  COMPREHENSIVE METABOLIC PANEL - Abnormal; Notable for the following components:      Result Value   CO2 21 (*)    All other components within normal limits  CBC WITH DIFFERENTIAL/PLATELET - Abnormal; Notable for the following components:   RBC 5.15 (*)    All other components within normal limits  LIPASE, BLOOD  URINALYSIS, ROUTINE W REFLEX MICROSCOPIC  I-STAT BETA HCG BLOOD, ED  ([facility], [facility], [facility] ONLY)    CT ABDOMEN PELVIS W CONTRAST  Final Result      Medications  HYDROmorphone (DILAUDID) injection 1 mg (1 mg Intravenous Given 08/14/21 2359)  sodium chloride 0.9 % bolus 1,000 mL (1,000 mLs Intravenous New Bag/Given 08/14/21 2339)  ondansetron (ZOFRAN) injection 4 mg (4 mg Intravenous Given 08/14/21 2359)  iohexol (OMNIPAQUE) 300 MG/ML solution 100 mL (100 mLs Intravenous Contrast Given 08/15/21 0008)     Procedures  /  Critical Care Procedures  ED Course and Medical Decision Making  I have reviewed the triage vital signs, the nursing notes, and pertinent available records from the EMR.  Social Determinants Affecting Complexity of Care: Patient has no clinically significant social determinants affecting this chief complaint..   ED Course:   Patient here with abdominal pain.  Top differential diagnoses include appy, [place], pyelo, UTI. Medical Decision Making Patient here with abdominal pain of uncertain etiology.  CT scan was reassuring.  Notable only for aortic atherosclerosis.  Patient also states that her headache has improved after treatment in the ED.  Laboratory work-up is reassuring.  Pregnancy test negative.  No concerning leukocytosis or electrolyte derangements.  Based on improvement, will discharge with PCP follow-up.  Return precautions discussed.  Problems Addressed: Acute nonintractable headache, unspecified headache type: acute illness or injury Right lower  quadrant abdominal pain: acute illness or injury  Amount and/or Complexity of Data Reviewed Labs: ordered.    Details: No leukocytosis, no electrolyte derangement, negative pregnancy test Radiology: ordered and independent interpretation performed.    Details: No free air  Risk Prescription drug management.     Consultants: No consultations were needed in caring for this patient.   Treatment and Plan: Emergency department workup does not suggest an emergent condition requiring admission  or immediate intervention beyond  what has been performed at this time. The patient is safe for discharge and has  been instructed to return immediately for worsening symptoms, change in  symptoms or any other concerns    Final Clinical Impressions(s) / ED Diagnoses     ICD-10-CM   1. Right lower quadrant abdominal pain  R10.31     2. Acute nonintractable headache, unspecified headache type  R51.9       ED Discharge Orders     None         Discharge Instructions Discussed with and Provided to Patient:     Discharge Instructions      The CT scan showed no concerning findings.  Your blood work and urine test are inconsistent with infection.  It's uncertain what the cause of your symptoms are.  Please follow-up with your doctor.  Return for new or worsening symptoms.  You CT scan showed and incidental finding of aortic atherosclerosis.  You can discuss this with your doctor.  No action needed tonight.       Montine Circle, PA-C 08/15/21 0100

## 2021-08-14 NOTE — ED Provider Triage Note (Signed)
Emergency Medicine Provider Triage Evaluation Note  Cynthia Vazquez , a 52 y.o. female  was evaluated in triage.  Pt complains of abdominal pain. States that pain has been intermittent in nature for the past 3 days.  Pain is located in the right lower quadrant and does not radiate.  States that it is sharp and comes on without discernible trigger.  Denies nausea, vomiting, diarrhea.  Denies any dysuria or hematuria.  Also endorses a headache, states that she gets migraines regularly and this feels like her normal migraine headache.  She denies dizziness, lightheadedness, diplopia, or weakness.  Review of Systems  Positive:  Negative:   Physical Exam  BP (!) 151/100 (BP Location: Left Arm)   Pulse 74   Temp 98.2 F (36.8 C) (Oral)   Resp 18   Ht '5\' 9"'$  (1.753 m)   Wt 88.5 kg   SpO2 99%   BMI 28.80 kg/m  Gen:   Awake, no distress   Resp:  Normal effort  MSK:   Moves extremities without difficulty  Other:    Medical Decision Making  Medically screening exam initiated at 7:40 PM.  Appropriate orders placed.  Cynthia Vazquez was informed that the remainder of the evaluation will be completed by another provider, this initial triage assessment does not replace that evaluation, and the importance of remaining in the ED until their evaluation is complete.     Bud Face, PA-C 08/14/21 1942

## 2021-08-15 ENCOUNTER — Emergency Department (HOSPITAL_COMMUNITY): Payer: BC Managed Care – PPO

## 2021-08-15 DIAGNOSIS — K429 Umbilical hernia without obstruction or gangrene: Secondary | ICD-10-CM | POA: Diagnosis not present

## 2021-08-15 MED ORDER — IOHEXOL 300 MG/ML  SOLN
100.0000 mL | Freq: Once | INTRAMUSCULAR | Status: AC | PRN
  Administered 2021-08-15: 100 mL via INTRAVENOUS

## 2021-08-15 NOTE — Discharge Instructions (Addendum)
The CT scan showed no concerning findings.  Your blood work and urine test are inconsistent with infection.  It's uncertain what the cause of your symptoms are.  Please follow-up with your doctor.  Return for new or worsening symptoms.  You CT scan showed and incidental finding of aortic atherosclerosis.  You can discuss this with your doctor.  No action needed tonight.

## 2021-08-23 DIAGNOSIS — Z713 Dietary counseling and surveillance: Secondary | ICD-10-CM | POA: Diagnosis not present

## 2021-09-06 DIAGNOSIS — Z713 Dietary counseling and surveillance: Secondary | ICD-10-CM | POA: Diagnosis not present

## 2021-09-20 DIAGNOSIS — Z713 Dietary counseling and surveillance: Secondary | ICD-10-CM | POA: Diagnosis not present

## 2021-10-04 DIAGNOSIS — Z713 Dietary counseling and surveillance: Secondary | ICD-10-CM | POA: Diagnosis not present

## 2021-10-10 DIAGNOSIS — E7800 Pure hypercholesterolemia, unspecified: Secondary | ICD-10-CM | POA: Diagnosis not present

## 2021-10-18 DIAGNOSIS — Z713 Dietary counseling and surveillance: Secondary | ICD-10-CM | POA: Diagnosis not present

## 2021-11-01 DIAGNOSIS — Z713 Dietary counseling and surveillance: Secondary | ICD-10-CM | POA: Diagnosis not present

## 2021-11-13 DIAGNOSIS — F411 Generalized anxiety disorder: Secondary | ICD-10-CM | POA: Diagnosis not present

## 2021-11-15 DIAGNOSIS — Z713 Dietary counseling and surveillance: Secondary | ICD-10-CM | POA: Diagnosis not present

## 2021-11-22 ENCOUNTER — Other Ambulatory Visit: Payer: Self-pay | Admitting: Family Medicine

## 2021-11-22 DIAGNOSIS — Z1231 Encounter for screening mammogram for malignant neoplasm of breast: Secondary | ICD-10-CM

## 2021-11-27 DIAGNOSIS — F411 Generalized anxiety disorder: Secondary | ICD-10-CM | POA: Diagnosis not present

## 2021-11-29 DIAGNOSIS — H40003 Preglaucoma, unspecified, bilateral: Secondary | ICD-10-CM | POA: Diagnosis not present

## 2021-12-06 DIAGNOSIS — Z713 Dietary counseling and surveillance: Secondary | ICD-10-CM | POA: Diagnosis not present

## 2021-12-11 DIAGNOSIS — F411 Generalized anxiety disorder: Secondary | ICD-10-CM | POA: Diagnosis not present

## 2021-12-20 DIAGNOSIS — Z713 Dietary counseling and surveillance: Secondary | ICD-10-CM | POA: Diagnosis not present

## 2022-01-01 DIAGNOSIS — F411 Generalized anxiety disorder: Secondary | ICD-10-CM | POA: Diagnosis not present

## 2022-01-10 DIAGNOSIS — Z713 Dietary counseling and surveillance: Secondary | ICD-10-CM | POA: Diagnosis not present

## 2022-01-22 DIAGNOSIS — F411 Generalized anxiety disorder: Secondary | ICD-10-CM | POA: Diagnosis not present

## 2022-01-24 DIAGNOSIS — Z713 Dietary counseling and surveillance: Secondary | ICD-10-CM | POA: Diagnosis not present

## 2022-01-26 ENCOUNTER — Other Ambulatory Visit: Payer: Self-pay

## 2022-01-26 ENCOUNTER — Ambulatory Visit
Admission: RE | Admit: 2022-01-26 | Discharge: 2022-01-26 | Disposition: A | Discharge status: Home / Self Care | Payer: BC Managed Care – PPO | Source: Ambulatory Visit | Attending: Family Medicine | Admitting: Family Medicine

## 2022-01-26 VITALS — BP 109/75 | HR 72 | Temp 98.5°F | Resp 16 | Wt 204.0 lb

## 2022-01-26 DIAGNOSIS — H00024 Hordeolum internum left upper eyelid: Secondary | ICD-10-CM

## 2022-01-26 DIAGNOSIS — H02844 Edema of left upper eyelid: Secondary | ICD-10-CM | POA: Diagnosis not present

## 2022-01-26 MED ORDER — DOXYCYCLINE HYCLATE 100 MG PO CAPS: 100.0000 mg | ORAL_CAPSULE | Freq: Two times a day (BID) | ORAL | 0 refills | Status: AC

## 2022-01-26 MED ORDER — PREDNISONE 20 MG PO TABS: ORAL_TABLET | ORAL | 0 refills | Status: AC

## 2022-01-26 NOTE — ED Triage Notes (Signed)
Left eye pain and swelling onset for a week

## 2022-01-26 NOTE — Discharge Instructions (Addendum)
Advised patient to take medications as directed with food to completion.  Advised patient to take prednisone with first dose of doxycycline for the next 5 of 7 days.  Encouraged increase daily water intake to 64 ounces per day while taking these medications.  Advised if symptoms worsen and/or unresolved please follow-up with optometry or here for further evaluation.

## 2022-01-26 NOTE — ED Provider Notes (Signed)
[facility]    CSN: [id number] Arrival date & time: 01/26/22  1145      History   Chief Complaint Chief Complaint  Patient presents with   Eye Pain    HPI Cynthia Vazquez is a 51 y.o. female.   HPI 51 year old female presents with left eye pain and swelling for 1 week.  PMH significant for morbid obesity, HTN, and prediabetes.  Past Medical History:  Diagnosis Date   Diabetes mellitus without complication ([place])    History of hiatal hernia    Hypertension    Iron deficiency anemia due to chronic blood loss    menstrual cycles    Patient Active Problem List   Diagnosis Date Noted   Other fatigue 01/21/2017   B12 deficiency 08/08/2016   Vitamin D deficiency 08/08/2016   Leukopenia 08/08/2016   HTN (hypertension) 11/07/2014   Prediabetes 11/07/2014   Hiatal hernia 11/07/2014   Lumbar and sacral osteoarthritis 11/07/2014   S/P laparoscopic sleeve gastrectomy 11/07/2014   Morbid obesity with BMI of 50.0-59.9, adult ([place]) 05/09/2014   Headache 09/16/2013   Thalassemia trait 08/19/2013   Iron deficiency anemia due to chronic blood loss 07/14/2013   Thrombocytosis 07/14/2013   Menorrhagia 07/14/2013    Past Surgical History:  Procedure Laterality Date   BREATH TEK H PYLORI N/A 07/22/2014   Procedure: BREATH TEK H PYLORI;  Surgeon: Greer Pickerel, MD;  Location: [facility];  Service: General;  Laterality: N/A;   [place] N/A 11/07/2014   Procedure: LAPAROSCOPIC GASTRIC SLEEVE RESECTION HIATAL HERNIA  REPAIR/ UPPER ENDO;  Surgeon: Greer Pickerel, MD;  Location: [facility];  Service: General;  Laterality: N/A;   TUBAL LIGATION     UPPER GI ENDOSCOPY  11/07/2014   Procedure: UPPER GI ENDOSCOPY;  Surgeon: Greer Pickerel, MD;  Location: [facility];  Service: General;;    OB History   No obstetric history on file.      Home Medications    Prior to Admission medications   Medication Sig Start Date End  Date Taking? Authorizing Provider  doxycycline (VIBRAMYCIN) 100 MG capsule Take 1 capsule (100 mg total) by mouth 2 (two) times daily for 7 days. 01/26/22 02/02/22 Yes Eliezer Lofts, FNP  predniSONE (DELTASONE) 20 MG tablet Take 3 tabs PO daily x 5 days. 01/26/22  Yes Eliezer Lofts, FNP  buPROPion (WELLBUTRIN XL) 300 MG 24 hr tablet Take 300 mg by mouth daily.    [provider]  levothyroxine (SYNTHROID, LEVOTHROID) 50 MCG tablet Take 50 mcg by mouth daily before breakfast.    [provider]  lisinopril-hydrochlorothiazide (PRINZIDE,ZESTORETIC) 10-12.5 MG tablet Take 1 tablet by mouth daily. 05/29/16   [provider]  loratadine (CLARITIN) 10 MG tablet Take 10 mg by mouth daily as needed for allergies.     [provider]  Multiple Vitamins-Minerals (OPTISOURCE POST BARIATRIC SURG) CHEW Chew 2 tablets by mouth 2 (two) times daily.    [provider]  rosuvastatin (CRESTOR) 5 MG tablet Take 5 mg by mouth daily.    [provider]    Family History Family History  Problem Relation Age of Onset   Anemia Mother    Anemia Paternal Grandfather    Breast cancer Neg Hx     Social History Social History   Tobacco Use   Smoking status: Never   Smokeless tobacco: Never  Substance Use Topics   Alcohol use: No  Drug use: No     Allergies   Shellfish allergy and Augmentin [amoxicillin-pot clavulanate]   Review of Systems Review of Systems  Eyes:  Positive for pain.  All other systems reviewed and are negative.    Physical Exam Triage Vital Signs ED Triage Vitals  Enc Vitals Group     BP 01/26/22 1221 109/75     Pulse Rate 01/26/22 1221 72     Resp 01/26/22 1221 16     Temp 01/26/22 1221 98.5 F (36.9 C)     Temp Source 01/26/22 1221 Oral     SpO2 01/26/22 1221 98 %     Weight 01/26/22 1223 204 lb (92.5 kg)     Height --      Head Circumference --      Peak Flow --      Pain Score 01/26/22 1223 1     Pain Loc --       Pain Edu? --      Excl. in [place]? --    No data found.  Updated Vital Signs BP 109/75 (BP Location: Left Arm)   Pulse 72   Temp 98.5 F (36.9 C) (Oral)   Resp 16   Wt 204 lb (92.5 kg)   LMP  (LMP Unknown)   SpO2 98%   BMI 30.13 kg/m       Physical Exam Vitals and nursing note reviewed.  Constitutional:      Appearance: Normal appearance. She is obese. She is ill-appearing.  HENT:     Head: Normocephalic and atraumatic.     Mouth/Throat:     Mouth: Mucous membranes are moist.     Pharynx: Oropharynx is clear.  Eyes:     Extraocular Movements: Extraocular movements intact.     Conjunctiva/sclera: Conjunctivae normal.     Pupils: Pupils are equal, round, and reactive to light.     Comments: Left upper eyelid: Mild soft tissue swelling and hordeolum internum noted  Cardiovascular:     Rate and Rhythm: Normal rate and regular rhythm.     Pulses: Normal pulses.     Heart sounds: Normal heart sounds.  Pulmonary:     Effort: Pulmonary effort is normal.     Breath sounds: Normal breath sounds. No wheezing, rhonchi or rales.  Musculoskeletal:        General: Normal range of motion.     Cervical back: Normal range of motion and neck supple.  Skin:    General: Skin is warm and dry.  Neurological:     General: No focal deficit present.     Mental Status: She is alert and oriented to person, place, and time. Mental status is at baseline.      UC Treatments / Results  Labs (all labs ordered are listed, but only abnormal results are displayed) Labs Reviewed - No data to display  EKG   Radiology No results found.  Procedures Procedures (including critical care time)  Medications Ordered in UC Medications - No data to display  Initial Impression / Assessment and Plan / UC Course  I have reviewed the triage vital signs and the nursing notes.  Pertinent labs & imaging results that were available during my care of the patient were reviewed by me and considered in my  medical decision making (see chart for details).     MDM: 1.  Hordeolum internum of left upper eyelid-Rx'd Doxycycline; 2 swelling of upper eyelid-Rx'd prednisone. Advised patient to take medications as directed with food to  completion.  Advised patient to take prednisone with first dose of doxycycline for the next 5 of 7 days.  Encouraged increase daily water intake to 64 ounces per day while taking these medications.  Advised if symptoms worsen and/or unresolved please follow-up with optometry or here for further evaluation. Final Clinical Impressions(s) / UC Diagnoses   Final diagnoses:  Hordeolum internum of left upper eyelid  Swelling of left upper eyelid     Discharge Instructions      Advised patient to take medications as directed with food to completion.  Advised patient to take prednisone with first dose of doxycycline for the next 5 of 7 days.  Encouraged increase daily water intake to 64 ounces per day while taking these medications.  Advised if symptoms worsen and/or unresolved please follow-up with optometry or here for further evaluation.     ED Prescriptions     Medication Sig Dispense Auth. Provider   doxycycline (VIBRAMYCIN) 100 MG capsule Take 1 capsule (100 mg total) by mouth 2 (two) times daily for 7 days. 14 capsule Eliezer Lofts, FNP   predniSONE (DELTASONE) 20 MG tablet Take 3 tabs PO daily x 5 days. 15 tablet Eliezer Lofts, FNP      PDMP not reviewed this encounter.   Eliezer Lofts, [place] 01/26/22 1335

## 2022-01-30 ENCOUNTER — Ambulatory Visit
Admission: RE | Admit: 2022-01-30 | Discharge: 2022-01-30 | Disposition: A | Discharge status: Home / Self Care | Payer: BC Managed Care – PPO | Source: Ambulatory Visit | Attending: Family Medicine | Admitting: Family Medicine

## 2022-01-30 DIAGNOSIS — Z1231 Encounter for screening mammogram for malignant neoplasm of breast: Secondary | ICD-10-CM | POA: Diagnosis not present

## 2022-02-07 DIAGNOSIS — Z713 Dietary counseling and surveillance: Secondary | ICD-10-CM | POA: Diagnosis not present

## 2022-02-28 DIAGNOSIS — Z713 Dietary counseling and surveillance: Secondary | ICD-10-CM | POA: Diagnosis not present

## 2022-03-14 DIAGNOSIS — Z713 Dietary counseling and surveillance: Secondary | ICD-10-CM | POA: Diagnosis not present

## 2022-03-15 DIAGNOSIS — Z1211 Encounter for screening for malignant neoplasm of colon: Secondary | ICD-10-CM | POA: Diagnosis not present

## 2022-03-17 ENCOUNTER — Emergency Department (HOSPITAL_COMMUNITY)
Admission: EM | Admit: 2022-03-17 | Discharge: 2022-03-17 | Disposition: A | Discharge status: Home / Self Care | Payer: BC Managed Care – PPO | Attending: Emergency Medicine | Admitting: Emergency Medicine

## 2022-03-17 ENCOUNTER — Emergency Department (HOSPITAL_COMMUNITY): Payer: BC Managed Care – PPO

## 2022-03-17 DIAGNOSIS — R519 Headache, unspecified: Secondary | ICD-10-CM | POA: Diagnosis not present

## 2022-03-17 DIAGNOSIS — R002 Palpitations: Secondary | ICD-10-CM | POA: Diagnosis not present

## 2022-03-17 DIAGNOSIS — G43809 Other migraine, not intractable, without status migrainosus: Secondary | ICD-10-CM | POA: Insufficient documentation

## 2022-03-17 DIAGNOSIS — I10 Essential (primary) hypertension: Secondary | ICD-10-CM | POA: Diagnosis not present

## 2022-03-17 DIAGNOSIS — Z79899 Other long term (current) drug therapy: Secondary | ICD-10-CM | POA: Diagnosis not present

## 2022-03-17 MED ORDER — LACTATED RINGERS IV SOLN: INTRAVENOUS | Status: DC

## 2022-03-17 MED ORDER — LACTATED RINGERS IV BOLUS
1000.0000 mL | Freq: Once | INTRAVENOUS | Status: AC
  Administered 2022-03-17: 1000 mL via INTRAVENOUS

## 2022-03-17 MED ORDER — DROPERIDOL 2.5 MG/ML IJ SOLN
2.5000 mg | Freq: Once | INTRAMUSCULAR | Status: AC
  Administered 2022-03-17: 2.5 mg via INTRAVENOUS
  Filled 2022-03-17: qty 2

## 2022-03-17 NOTE — ED Triage Notes (Signed)
Pt states that she had colonoscopy on Friday and has been having a migraine with photophobia since taking the prep for the procedure. Worsening today. Pt also c/o intermittent heart palpitations since then. No SOB, CP, N/V, diaphoresis.

## 2022-03-17 NOTE — ED Provider Notes (Addendum)
[facility] Provider Note   CSN: [id number] Arrival date & time: 03/17/22  4098     History  Chief Complaint  Patient presents with   Migraine   Palpitations    Cynthia Vazquez is a 52 y.o. female.  52 year old female with history of migraines presents with several days of headache.  States that she had a colonoscopy done 2 days ago and has had a headache since then.  Headache is on her face and has been associate with nausea but no vomiting.  No fever or chills.  No neck pain.  Some photophobia.  This is similar to her prior migraines.  Denies any rectal bleeding.  Also endorses palpitations with out ACS symptoms or syncope       Home Medications Prior to Admission medications   Medication Sig Start Date End Date Taking? Authorizing Provider  buPROPion (WELLBUTRIN XL) 300 MG 24 hr tablet Take 300 mg by mouth daily.    [provider]  levothyroxine (SYNTHROID, LEVOTHROID) 50 MCG tablet Take 50 mcg by mouth daily before breakfast.    [provider]  lisinopril-hydrochlorothiazide (PRINZIDE,ZESTORETIC) 10-12.5 MG tablet Take 1 tablet by mouth daily. 05/29/16   [provider]  loratadine (CLARITIN) 10 MG tablet Take 10 mg by mouth daily as needed for allergies.     [provider]  Multiple Vitamins-Minerals (OPTISOURCE POST BARIATRIC SURG) CHEW Chew 2 tablets by mouth 2 (two) times daily.    [provider]  predniSONE (DELTASONE) 20 MG tablet Take 3 tabs PO daily x 5 days. 01/26/22   Eliezer Lofts, FNP  rosuvastatin (CRESTOR) 5 MG tablet Take 5 mg by mouth daily.    [provider]      Allergies    Shellfish allergy and Augmentin [amoxicillin-pot clavulanate]    Review of Systems   Review of Systems  All other systems reviewed and are negative.   Physical Exam Updated Vital Signs BP 120/76 (BP Location: Left Arm)   Pulse 77   Temp (!) 97.4 F (36.3 C) (Oral)   Resp 20    SpO2 100%  Physical Exam Vitals and nursing note reviewed.  Constitutional:      General: She is not in acute distress.    Appearance: Normal appearance. She is well-developed. She is not toxic-appearing.  HENT:     Head: Normocephalic and atraumatic.  Eyes:     General: Lids are normal.     Conjunctiva/sclera: Conjunctivae normal.     Pupils: Pupils are equal, round, and reactive to light.  Neck:     Thyroid: No thyroid mass.     Trachea: No tracheal deviation.  Cardiovascular:     Rate and Rhythm: Normal rate and regular rhythm.     Heart sounds: Normal heart sounds. No murmur heard.    No gallop.  Pulmonary:     Effort: Pulmonary effort is normal. No respiratory distress.     Breath sounds: Normal breath sounds. No stridor. No decreased breath sounds, wheezing, rhonchi or rales.  Abdominal:     General: There is no distension.     Palpations: Abdomen is soft.     Tenderness: There is no abdominal tenderness. There is no rebound.  Musculoskeletal:        General: No tenderness. Normal range of motion.     Cervical back: Normal range of motion and neck supple.  Skin:    General: Skin is warm and dry.  Findings: No abrasion or rash.  Neurological:     Mental Status: She is alert and oriented to person, place, and time. Mental status is at baseline.     GCS: GCS eye subscore is 4. GCS verbal subscore is 5. GCS motor subscore is 6.     Cranial Nerves: No cranial nerve deficit.     Sensory: No sensory deficit.     Motor: Motor function is intact.  Psychiatric:        Attention and Perception: Attention normal.        Speech: Speech normal.        Behavior: Behavior normal.     ED Results / Procedures / Treatments   Labs (all labs ordered are listed, but only abnormal results are displayed) Labs Reviewed - No data to display  EKG EKG Interpretation  Date/Time:  Sunday March 17 2022 08:57:52 EST Ventricular Rate:  62 PR Interval:  164 QRS Duration: 100 QT  Interval:  405 QTC Calculation: 412 R Axis:   58 Text Interpretation: Sinus rhythm Low voltage, precordial leads No significant change since last tracing Confirmed by Lacretia Leigh ([id number]) on 03/17/2022 10:32:24 AM  Radiology No results found.  Procedures Procedures    Medications Ordered in ED Medications  lactated ringers infusion (has no administration in time range)  lactated ringers bolus 1,000 mL (has no administration in time range)  droperidol (INAPSINE) 2.5 MG/ML injection 2.5 mg (has no administration in time range)    ED Course/ Medical Decision Making/ A&P                             Medical Decision Making Amount and/or Complexity of Data Reviewed Radiology: ordered. ECG/medicine tests: ordered.  Risk Prescription drug management.  Patient had palpitations and EKG per interpretation shows normal sinus rhythm.  No concern for ACS or PE. Patient here with headache similar to her prior migraines.  Head CT performed and no new findings per my review and interpretation.  Given IV fluids as well as droperidol her headache is much improved.  Low suspicion for intracranial hemorrhage or subarachnoid bleed.  Neurological exam repeated and is stable.  Will discharge        Final Clinical Impression(s) / ED Diagnoses Final diagnoses:  None    Rx / DC Orders ED Discharge Orders     None         Lacretia Leigh, MD 03/17/22 1044    Lacretia Leigh, MD 03/17/22 1045

## 2022-03-19 DIAGNOSIS — R443 Hallucinations, unspecified: Secondary | ICD-10-CM | POA: Diagnosis not present

## 2022-03-20 DIAGNOSIS — T887XXA Unspecified adverse effect of drug or medicament, initial encounter: Secondary | ICD-10-CM | POA: Diagnosis not present

## 2022-03-20 DIAGNOSIS — Z79899 Other long term (current) drug therapy: Secondary | ICD-10-CM | POA: Diagnosis not present

## 2022-03-20 DIAGNOSIS — G43909 Migraine, unspecified, not intractable, without status migrainosus: Secondary | ICD-10-CM | POA: Diagnosis not present

## 2022-03-28 DIAGNOSIS — Z713 Dietary counseling and surveillance: Secondary | ICD-10-CM | POA: Diagnosis not present

## 2022-04-11 DIAGNOSIS — Z713 Dietary counseling and surveillance: Secondary | ICD-10-CM | POA: Diagnosis not present

## 2022-04-12 DIAGNOSIS — F411 Generalized anxiety disorder: Secondary | ICD-10-CM | POA: Diagnosis not present

## 2022-04-22 DIAGNOSIS — F411 Generalized anxiety disorder: Secondary | ICD-10-CM | POA: Diagnosis not present

## 2022-04-25 DIAGNOSIS — Z713 Dietary counseling and surveillance: Secondary | ICD-10-CM | POA: Diagnosis not present

## 2022-05-02 DIAGNOSIS — E119 Type 2 diabetes mellitus without complications: Secondary | ICD-10-CM | POA: Diagnosis not present

## 2022-05-02 DIAGNOSIS — F4322 Adjustment disorder with anxiety: Secondary | ICD-10-CM | POA: Diagnosis not present

## 2022-05-08 DIAGNOSIS — G43009 Migraine without aura, not intractable, without status migrainosus: Secondary | ICD-10-CM | POA: Diagnosis not present

## 2022-05-09 DIAGNOSIS — Z713 Dietary counseling and surveillance: Secondary | ICD-10-CM | POA: Diagnosis not present

## 2022-05-23 DIAGNOSIS — Z713 Dietary counseling and surveillance: Secondary | ICD-10-CM | POA: Diagnosis not present

## 2022-06-06 DIAGNOSIS — Z713 Dietary counseling and surveillance: Secondary | ICD-10-CM | POA: Diagnosis not present

## 2022-06-13 DIAGNOSIS — H40003 Preglaucoma, unspecified, bilateral: Secondary | ICD-10-CM | POA: Diagnosis not present

## 2022-06-19 DIAGNOSIS — R5383 Other fatigue: Secondary | ICD-10-CM | POA: Diagnosis not present

## 2022-06-19 DIAGNOSIS — E611 Iron deficiency: Secondary | ICD-10-CM | POA: Diagnosis not present

## 2022-06-19 DIAGNOSIS — Z8639 Personal history of other endocrine, nutritional and metabolic disease: Secondary | ICD-10-CM | POA: Diagnosis not present

## 2022-06-19 DIAGNOSIS — Z862 Personal history of diseases of the blood and blood-forming organs and certain disorders involving the immune mechanism: Secondary | ICD-10-CM | POA: Diagnosis not present

## 2022-06-19 DIAGNOSIS — Z9884 Bariatric surgery status: Secondary | ICD-10-CM | POA: Diagnosis not present

## 2022-06-27 DIAGNOSIS — Z713 Dietary counseling and surveillance: Secondary | ICD-10-CM | POA: Diagnosis not present

## 2022-07-11 DIAGNOSIS — Z713 Dietary counseling and surveillance: Secondary | ICD-10-CM | POA: Diagnosis not present

## 2022-07-18 ENCOUNTER — Other Ambulatory Visit: Payer: Self-pay | Admitting: Family Medicine

## 2022-07-18 ENCOUNTER — Other Ambulatory Visit (HOSPITAL_COMMUNITY)
Admission: RE | Admit: 2022-07-18 | Discharge: 2022-07-18 | Disposition: A | Discharge status: Home / Self Care | Payer: BC Managed Care – PPO | Source: Ambulatory Visit | Attending: Family Medicine | Admitting: Family Medicine

## 2022-07-18 DIAGNOSIS — Z01411 Encounter for gynecological examination (general) (routine) with abnormal findings: Secondary | ICD-10-CM | POA: Diagnosis not present

## 2022-07-18 DIAGNOSIS — E559 Vitamin D deficiency, unspecified: Secondary | ICD-10-CM | POA: Diagnosis not present

## 2022-07-18 DIAGNOSIS — E7800 Pure hypercholesterolemia, unspecified: Secondary | ICD-10-CM | POA: Diagnosis not present

## 2022-07-18 DIAGNOSIS — Z9884 Bariatric surgery status: Secondary | ICD-10-CM | POA: Diagnosis not present

## 2022-07-18 DIAGNOSIS — Z0000 Encounter for general adult medical examination without abnormal findings: Secondary | ICD-10-CM | POA: Diagnosis not present

## 2022-07-18 DIAGNOSIS — E119 Type 2 diabetes mellitus without complications: Secondary | ICD-10-CM | POA: Diagnosis not present

## 2022-07-18 DIAGNOSIS — Z79899 Other long term (current) drug therapy: Secondary | ICD-10-CM | POA: Diagnosis not present

## 2022-07-25 DIAGNOSIS — Z713 Dietary counseling and surveillance: Secondary | ICD-10-CM | POA: Diagnosis not present

## 2022-07-26 LAB — CYTOLOGY - PAP
Comment: NEGATIVE
Diagnosis: NEGATIVE
High risk HPV: NEGATIVE

## 2022-08-08 DIAGNOSIS — Z713 Dietary counseling and surveillance: Secondary | ICD-10-CM | POA: Diagnosis not present

## 2022-08-15 DIAGNOSIS — M25561 Pain in right knee: Secondary | ICD-10-CM | POA: Diagnosis not present

## 2022-08-15 DIAGNOSIS — J019 Acute sinusitis, unspecified: Secondary | ICD-10-CM | POA: Diagnosis not present

## 2022-08-26 DIAGNOSIS — Z713 Dietary counseling and surveillance: Secondary | ICD-10-CM | POA: Diagnosis not present

## 2022-09-09 DIAGNOSIS — Z713 Dietary counseling and surveillance: Secondary | ICD-10-CM | POA: Diagnosis not present

## 2022-09-23 DIAGNOSIS — Z713 Dietary counseling and surveillance: Secondary | ICD-10-CM | POA: Diagnosis not present

## 2022-10-14 DIAGNOSIS — Z713 Dietary counseling and surveillance: Secondary | ICD-10-CM | POA: Diagnosis not present

## 2022-11-04 DIAGNOSIS — Z713 Dietary counseling and surveillance: Secondary | ICD-10-CM | POA: Diagnosis not present

## 2022-11-07 ENCOUNTER — Emergency Department (HOSPITAL_COMMUNITY): Payer: BC Managed Care – PPO

## 2022-11-07 ENCOUNTER — Other Ambulatory Visit: Payer: Self-pay

## 2022-11-07 ENCOUNTER — Emergency Department (HOSPITAL_COMMUNITY)
Admission: EM | Admit: 2022-11-07 | Discharge: 2022-11-07 | Disposition: A | Discharge status: Home / Self Care | Payer: BC Managed Care – PPO | Attending: Emergency Medicine | Admitting: Emergency Medicine

## 2022-11-07 ENCOUNTER — Encounter (HOSPITAL_COMMUNITY): Payer: Self-pay

## 2022-11-07 DIAGNOSIS — M79603 Pain in arm, unspecified: Secondary | ICD-10-CM | POA: Diagnosis not present

## 2022-11-07 DIAGNOSIS — M25561 Pain in right knee: Secondary | ICD-10-CM | POA: Diagnosis not present

## 2022-11-07 DIAGNOSIS — I10 Essential (primary) hypertension: Secondary | ICD-10-CM | POA: Diagnosis not present

## 2022-11-07 DIAGNOSIS — S299XXA Unspecified injury of thorax, initial encounter: Secondary | ICD-10-CM | POA: Diagnosis not present

## 2022-11-07 DIAGNOSIS — R930 Abnormal findings on diagnostic imaging of skull and head, not elsewhere classified: Secondary | ICD-10-CM | POA: Diagnosis not present

## 2022-11-07 DIAGNOSIS — M79602 Pain in left arm: Secondary | ICD-10-CM | POA: Diagnosis not present

## 2022-11-07 DIAGNOSIS — Z79899 Other long term (current) drug therapy: Secondary | ICD-10-CM | POA: Insufficient documentation

## 2022-11-07 DIAGNOSIS — Y92410 Unspecified street and highway as the place of occurrence of the external cause: Secondary | ICD-10-CM | POA: Diagnosis not present

## 2022-11-07 DIAGNOSIS — S80811A Abrasion, right lower leg, initial encounter: Secondary | ICD-10-CM | POA: Insufficient documentation

## 2022-11-07 DIAGNOSIS — M542 Cervicalgia: Secondary | ICD-10-CM | POA: Diagnosis not present

## 2022-11-07 DIAGNOSIS — M25562 Pain in left knee: Secondary | ICD-10-CM | POA: Diagnosis not present

## 2022-11-07 DIAGNOSIS — R609 Edema, unspecified: Secondary | ICD-10-CM | POA: Diagnosis not present

## 2022-11-07 DIAGNOSIS — M19012 Primary osteoarthritis, left shoulder: Secondary | ICD-10-CM | POA: Diagnosis not present

## 2022-11-07 DIAGNOSIS — I700 Atherosclerosis of aorta: Secondary | ICD-10-CM | POA: Diagnosis not present

## 2022-11-07 DIAGNOSIS — J439 Emphysema, unspecified: Secondary | ICD-10-CM | POA: Diagnosis not present

## 2022-11-07 DIAGNOSIS — M5031 Other cervical disc degeneration,  high cervical region: Secondary | ICD-10-CM | POA: Diagnosis not present

## 2022-11-07 DIAGNOSIS — S81812A Laceration without foreign body, left lower leg, initial encounter: Secondary | ICD-10-CM | POA: Insufficient documentation

## 2022-11-07 DIAGNOSIS — S8991XA Unspecified injury of right lower leg, initial encounter: Secondary | ICD-10-CM | POA: Diagnosis not present

## 2022-11-07 DIAGNOSIS — M4802 Spinal stenosis, cervical region: Secondary | ICD-10-CM | POA: Diagnosis not present

## 2022-11-07 DIAGNOSIS — S0990XA Unspecified injury of head, initial encounter: Secondary | ICD-10-CM | POA: Diagnosis not present

## 2022-11-07 DIAGNOSIS — Z041 Encounter for examination and observation following transport accident: Secondary | ICD-10-CM | POA: Diagnosis not present

## 2022-11-07 DIAGNOSIS — G4489 Other headache syndrome: Secondary | ICD-10-CM | POA: Diagnosis not present

## 2022-11-07 DIAGNOSIS — M25512 Pain in left shoulder: Secondary | ICD-10-CM | POA: Insufficient documentation

## 2022-11-07 DIAGNOSIS — M5134 Other intervertebral disc degeneration, thoracic region: Secondary | ICD-10-CM | POA: Diagnosis not present

## 2022-11-07 LAB — CBC
HCT: 41.4 % (ref 36.0–46.0)
Hemoglobin: 12.5 g/dL (ref 12.0–15.0)
MCH: 25.8 pg — ABNORMAL LOW (ref 26.0–34.0)
MCHC: 30.2 g/dL (ref 30.0–36.0)
MCV: 85.5 fL (ref 80.0–100.0)
Platelets: 160 10*3/uL (ref 150–400)
RBC: 4.84 MIL/uL (ref 3.87–5.11)
RDW: 13.5 % (ref 11.5–15.5)
WBC: 4.7 10*3/uL (ref 4.0–10.5)
nRBC: 0 % (ref 0.0–0.2)

## 2022-11-07 MED ORDER — HYDROMORPHONE HCL 1 MG/ML IJ SOLN
1.0000 mg | Freq: Once | INTRAMUSCULAR | Status: AC
  Administered 2022-11-07: 1 mg via INTRAVENOUS
  Filled 2022-11-07: qty 1

## 2022-11-07 MED ORDER — FENTANYL CITRATE PF 50 MCG/ML IJ SOSY
50.0000 ug | PREFILLED_SYRINGE | Freq: Once | INTRAMUSCULAR | Status: AC
  Administered 2022-11-07: 50 ug via INTRAVENOUS
  Filled 2022-11-07: qty 1

## 2022-11-07 MED ORDER — ACETAMINOPHEN 325 MG PO TABS: 650.0000 mg | ORAL_TABLET | Freq: Four times a day (QID) | ORAL | 0 refills | Status: AC | PRN

## 2022-11-07 MED ORDER — OXYCODONE HCL 5 MG PO TABS: 5.0000 mg | ORAL_TABLET | Freq: Three times a day (TID) | ORAL | 0 refills | Status: AC | PRN
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
## 2022-11-07 NOTE — ED Notes (Signed)
Patient transported to CT 
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
## 2022-11-07 NOTE — Progress Notes (Signed)
Orthopedic Tech Progress Note Patient Details:  Cynthia Vazquez 1970-10-08 [id number]  Ortho Devices Type of Ortho Device: Sling immobilizer Ortho Device/Splint Location: LUE Ortho Device/Splint Interventions: Ordered, Application, Adjustment   Post Interventions Patient Tolerated: Fair, Well Instructions Provided: Care of device  Donald Pore 11/07/2022, 11:12 PM
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
## 2022-11-09 DIAGNOSIS — T24012A Burn of unspecified degree of left thigh, initial encounter: Secondary | ICD-10-CM | POA: Diagnosis not present

## 2022-11-09 DIAGNOSIS — M62838 Other muscle spasm: Secondary | ICD-10-CM | POA: Diagnosis not present

## 2022-11-09 DIAGNOSIS — M545 Low back pain, unspecified: Secondary | ICD-10-CM | POA: Diagnosis not present

## 2022-11-09 DIAGNOSIS — M79602 Pain in left arm: Secondary | ICD-10-CM | POA: Diagnosis not present

## 2022-11-15 DIAGNOSIS — M79604 Pain in right leg: Secondary | ICD-10-CM | POA: Diagnosis not present

## 2022-11-15 DIAGNOSIS — S81819A Laceration without foreign body, unspecified lower leg, initial encounter: Secondary | ICD-10-CM | POA: Diagnosis not present

## 2022-11-15 DIAGNOSIS — Z23 Encounter for immunization: Secondary | ICD-10-CM | POA: Diagnosis not present

## 2022-11-16 DIAGNOSIS — E079 Disorder of thyroid, unspecified: Secondary | ICD-10-CM | POA: Diagnosis not present

## 2022-11-16 DIAGNOSIS — I10 Essential (primary) hypertension: Secondary | ICD-10-CM | POA: Diagnosis not present

## 2022-11-16 DIAGNOSIS — M79604 Pain in right leg: Secondary | ICD-10-CM | POA: Diagnosis not present

## 2022-11-16 DIAGNOSIS — G8911 Acute pain due to trauma: Secondary | ICD-10-CM | POA: Diagnosis not present

## 2022-11-16 DIAGNOSIS — M25561 Pain in right knee: Secondary | ICD-10-CM | POA: Diagnosis not present

## 2022-11-16 DIAGNOSIS — Y92410 Unspecified street and highway as the place of occurrence of the external cause: Secondary | ICD-10-CM | POA: Diagnosis not present

## 2022-11-16 DIAGNOSIS — R600 Localized edema: Secondary | ICD-10-CM | POA: Diagnosis not present

## 2022-11-16 DIAGNOSIS — Z880 Allergy status to penicillin: Secondary | ICD-10-CM | POA: Diagnosis not present

## 2022-11-16 DIAGNOSIS — M7989 Other specified soft tissue disorders: Secondary | ICD-10-CM | POA: Diagnosis not present

## 2022-11-18 DIAGNOSIS — Z713 Dietary counseling and surveillance: Secondary | ICD-10-CM | POA: Diagnosis not present

## 2022-11-19 ENCOUNTER — Other Ambulatory Visit (HOSPITAL_COMMUNITY): Payer: Self-pay | Admitting: Family Medicine

## 2022-11-19 ENCOUNTER — Encounter (HOSPITAL_COMMUNITY): Payer: BC Managed Care – PPO

## 2022-11-19 DIAGNOSIS — S8991XA Unspecified injury of right lower leg, initial encounter: Secondary | ICD-10-CM | POA: Diagnosis not present

## 2022-11-19 DIAGNOSIS — S59911A Unspecified injury of right forearm, initial encounter: Secondary | ICD-10-CM | POA: Diagnosis not present

## 2022-11-19 DIAGNOSIS — M7989 Other specified soft tissue disorders: Secondary | ICD-10-CM

## 2022-11-26 DIAGNOSIS — S199XXA Unspecified injury of neck, initial encounter: Secondary | ICD-10-CM | POA: Diagnosis not present

## 2022-11-26 DIAGNOSIS — S83411A Sprain of medial collateral ligament of right knee, initial encounter: Secondary | ICD-10-CM | POA: Diagnosis not present

## 2022-12-02 DIAGNOSIS — F411 Generalized anxiety disorder: Secondary | ICD-10-CM | POA: Diagnosis not present

## 2022-12-09 DIAGNOSIS — Z713 Dietary counseling and surveillance: Secondary | ICD-10-CM | POA: Diagnosis not present

## 2022-12-10 DIAGNOSIS — M25512 Pain in left shoulder: Secondary | ICD-10-CM | POA: Diagnosis not present

## 2022-12-10 DIAGNOSIS — Z7409 Other reduced mobility: Secondary | ICD-10-CM | POA: Diagnosis not present

## 2022-12-10 DIAGNOSIS — M25561 Pain in right knee: Secondary | ICD-10-CM | POA: Diagnosis not present

## 2022-12-10 DIAGNOSIS — M542 Cervicalgia: Secondary | ICD-10-CM | POA: Diagnosis not present

## 2022-12-12 DIAGNOSIS — M25561 Pain in right knee: Secondary | ICD-10-CM | POA: Diagnosis not present

## 2022-12-12 DIAGNOSIS — F411 Generalized anxiety disorder: Secondary | ICD-10-CM | POA: Diagnosis not present

## 2022-12-13 DIAGNOSIS — S060X9D Concussion with loss of consciousness of unspecified duration, subsequent encounter: Secondary | ICD-10-CM | POA: Diagnosis not present

## 2022-12-14 DIAGNOSIS — R000 Tachycardia, unspecified: Secondary | ICD-10-CM | POA: Diagnosis not present

## 2022-12-14 DIAGNOSIS — F29 Unspecified psychosis not due to a substance or known physiological condition: Secondary | ICD-10-CM | POA: Diagnosis not present

## 2022-12-14 DIAGNOSIS — R42 Dizziness and giddiness: Secondary | ICD-10-CM | POA: Diagnosis not present

## 2022-12-14 DIAGNOSIS — I10 Essential (primary) hypertension: Secondary | ICD-10-CM | POA: Diagnosis not present

## 2022-12-14 DIAGNOSIS — F419 Anxiety disorder, unspecified: Secondary | ICD-10-CM | POA: Diagnosis not present

## 2022-12-14 DIAGNOSIS — F333 Major depressive disorder, recurrent, severe with psychotic symptoms: Secondary | ICD-10-CM | POA: Diagnosis not present

## 2022-12-14 DIAGNOSIS — R112 Nausea with vomiting, unspecified: Secondary | ICD-10-CM | POA: Diagnosis not present

## 2022-12-14 DIAGNOSIS — G252 Other specified forms of tremor: Secondary | ICD-10-CM | POA: Diagnosis not present

## 2022-12-14 DIAGNOSIS — R251 Tremor, unspecified: Secondary | ICD-10-CM | POA: Diagnosis not present

## 2022-12-14 DIAGNOSIS — R531 Weakness: Secondary | ICD-10-CM | POA: Diagnosis not present

## 2022-12-14 DIAGNOSIS — R9431 Abnormal electrocardiogram [ECG] [EKG]: Secondary | ICD-10-CM | POA: Diagnosis not present

## 2022-12-14 DIAGNOSIS — R350 Frequency of micturition: Secondary | ICD-10-CM | POA: Diagnosis not present

## 2022-12-14 DIAGNOSIS — Z20822 Contact with and (suspected) exposure to covid-19: Secondary | ICD-10-CM | POA: Diagnosis not present

## 2022-12-14 DIAGNOSIS — I629 Nontraumatic intracranial hemorrhage, unspecified: Secondary | ICD-10-CM | POA: Diagnosis not present

## 2022-12-14 DIAGNOSIS — R4182 Altered mental status, unspecified: Secondary | ICD-10-CM | POA: Diagnosis not present

## 2022-12-15 DIAGNOSIS — R000 Tachycardia, unspecified: Secondary | ICD-10-CM | POA: Diagnosis not present

## 2022-12-15 DIAGNOSIS — R456 Violent behavior: Secondary | ICD-10-CM | POA: Diagnosis not present

## 2022-12-15 DIAGNOSIS — F331 Major depressive disorder, recurrent, moderate: Secondary | ICD-10-CM | POA: Diagnosis not present

## 2022-12-15 DIAGNOSIS — F419 Anxiety disorder, unspecified: Secondary | ICD-10-CM | POA: Diagnosis not present

## 2022-12-15 DIAGNOSIS — F333 Major depressive disorder, recurrent, severe with psychotic symptoms: Secondary | ICD-10-CM | POA: Diagnosis not present

## 2022-12-15 DIAGNOSIS — R4182 Altered mental status, unspecified: Secondary | ICD-10-CM | POA: Diagnosis not present

## 2022-12-15 DIAGNOSIS — I629 Nontraumatic intracranial hemorrhage, unspecified: Secondary | ICD-10-CM | POA: Diagnosis not present

## 2022-12-15 DIAGNOSIS — F29 Unspecified psychosis not due to a substance or known physiological condition: Secondary | ICD-10-CM | POA: Diagnosis not present

## 2022-12-15 DIAGNOSIS — R9431 Abnormal electrocardiogram [ECG] [EKG]: Secondary | ICD-10-CM | POA: Diagnosis not present

## 2022-12-15 DIAGNOSIS — G252 Other specified forms of tremor: Secondary | ICD-10-CM | POA: Diagnosis not present

## 2022-12-15 DIAGNOSIS — I10 Essential (primary) hypertension: Secondary | ICD-10-CM | POA: Diagnosis not present

## 2022-12-15 DIAGNOSIS — R251 Tremor, unspecified: Secondary | ICD-10-CM | POA: Diagnosis not present

## 2022-12-16 DIAGNOSIS — F4325 Adjustment disorder with mixed disturbance of emotions and conduct: Secondary | ICD-10-CM | POA: Diagnosis not present

## 2022-12-16 DIAGNOSIS — F69 Unspecified disorder of adult personality and behavior: Secondary | ICD-10-CM | POA: Diagnosis not present

## 2022-12-16 DIAGNOSIS — F918 Other conduct disorders: Secondary | ICD-10-CM | POA: Diagnosis not present

## 2022-12-16 DIAGNOSIS — R457 State of emotional shock and stress, unspecified: Secondary | ICD-10-CM | POA: Diagnosis not present

## 2022-12-16 DIAGNOSIS — G252 Other specified forms of tremor: Secondary | ICD-10-CM | POA: Diagnosis not present

## 2022-12-16 DIAGNOSIS — F29 Unspecified psychosis not due to a substance or known physiological condition: Secondary | ICD-10-CM | POA: Diagnosis not present

## 2022-12-16 DIAGNOSIS — R450 Nervousness: Secondary | ICD-10-CM | POA: Diagnosis not present

## 2022-12-16 DIAGNOSIS — F339 Major depressive disorder, recurrent, unspecified: Secondary | ICD-10-CM | POA: Diagnosis not present

## 2022-12-16 DIAGNOSIS — F329 Major depressive disorder, single episode, unspecified: Secondary | ICD-10-CM | POA: Diagnosis not present

## 2022-12-16 DIAGNOSIS — R000 Tachycardia, unspecified: Secondary | ICD-10-CM | POA: Diagnosis not present

## 2022-12-16 DIAGNOSIS — Z79899 Other long term (current) drug therapy: Secondary | ICD-10-CM | POA: Diagnosis not present

## 2022-12-16 DIAGNOSIS — F323 Major depressive disorder, single episode, severe with psychotic features: Secondary | ICD-10-CM | POA: Diagnosis not present

## 2022-12-16 DIAGNOSIS — Z91013 Allergy to seafood: Secondary | ICD-10-CM | POA: Diagnosis not present

## 2022-12-16 DIAGNOSIS — Z880 Allergy status to penicillin: Secondary | ICD-10-CM | POA: Diagnosis not present

## 2022-12-16 DIAGNOSIS — F411 Generalized anxiety disorder: Secondary | ICD-10-CM | POA: Diagnosis not present

## 2022-12-16 DIAGNOSIS — F22 Delusional disorders: Secondary | ICD-10-CM | POA: Diagnosis not present

## 2022-12-16 DIAGNOSIS — I10 Essential (primary) hypertension: Secondary | ICD-10-CM | POA: Diagnosis not present

## 2022-12-16 DIAGNOSIS — R251 Tremor, unspecified: Secondary | ICD-10-CM | POA: Diagnosis not present

## 2022-12-17 DIAGNOSIS — F4325 Adjustment disorder with mixed disturbance of emotions and conduct: Secondary | ICD-10-CM | POA: Diagnosis not present

## 2022-12-18 DIAGNOSIS — F4325 Adjustment disorder with mixed disturbance of emotions and conduct: Secondary | ICD-10-CM | POA: Diagnosis not present

## 2022-12-19 DIAGNOSIS — R251 Tremor, unspecified: Secondary | ICD-10-CM | POA: Diagnosis not present

## 2022-12-19 DIAGNOSIS — S0990XS Unspecified injury of head, sequela: Secondary | ICD-10-CM | POA: Diagnosis not present

## 2022-12-23 DIAGNOSIS — Z713 Dietary counseling and surveillance: Secondary | ICD-10-CM | POA: Diagnosis not present

## 2022-12-24 DIAGNOSIS — M25512 Pain in left shoulder: Secondary | ICD-10-CM | POA: Diagnosis not present

## 2022-12-24 DIAGNOSIS — F411 Generalized anxiety disorder: Secondary | ICD-10-CM | POA: Diagnosis not present

## 2022-12-24 DIAGNOSIS — M25561 Pain in right knee: Secondary | ICD-10-CM | POA: Diagnosis not present

## 2022-12-24 DIAGNOSIS — M542 Cervicalgia: Secondary | ICD-10-CM | POA: Diagnosis not present

## 2022-12-24 DIAGNOSIS — Z7409 Other reduced mobility: Secondary | ICD-10-CM | POA: Diagnosis not present

## 2022-12-30 DIAGNOSIS — M25561 Pain in right knee: Secondary | ICD-10-CM | POA: Diagnosis not present

## 2022-12-30 DIAGNOSIS — M542 Cervicalgia: Secondary | ICD-10-CM | POA: Diagnosis not present

## 2022-12-30 DIAGNOSIS — Z7409 Other reduced mobility: Secondary | ICD-10-CM | POA: Diagnosis not present

## 2022-12-30 DIAGNOSIS — M25512 Pain in left shoulder: Secondary | ICD-10-CM | POA: Diagnosis not present

## 2022-12-31 DIAGNOSIS — F411 Generalized anxiety disorder: Secondary | ICD-10-CM | POA: Diagnosis not present

## 2023-01-06 DIAGNOSIS — Z713 Dietary counseling and surveillance: Secondary | ICD-10-CM | POA: Diagnosis not present

## 2023-01-06 DIAGNOSIS — M542 Cervicalgia: Secondary | ICD-10-CM | POA: Diagnosis not present

## 2023-01-06 DIAGNOSIS — M25562 Pain in left knee: Secondary | ICD-10-CM | POA: Diagnosis not present

## 2023-01-06 DIAGNOSIS — Z7409 Other reduced mobility: Secondary | ICD-10-CM | POA: Diagnosis not present

## 2023-01-06 DIAGNOSIS — M25561 Pain in right knee: Secondary | ICD-10-CM | POA: Diagnosis not present

## 2023-01-09 DIAGNOSIS — F411 Generalized anxiety disorder: Secondary | ICD-10-CM | POA: Diagnosis not present

## 2023-01-13 DIAGNOSIS — M25512 Pain in left shoulder: Secondary | ICD-10-CM | POA: Diagnosis not present

## 2023-01-13 DIAGNOSIS — M25561 Pain in right knee: Secondary | ICD-10-CM | POA: Diagnosis not present

## 2023-01-13 DIAGNOSIS — M542 Cervicalgia: Secondary | ICD-10-CM | POA: Diagnosis not present

## 2023-01-13 DIAGNOSIS — Z7409 Other reduced mobility: Secondary | ICD-10-CM | POA: Diagnosis not present

## 2023-01-15 DIAGNOSIS — F411 Generalized anxiety disorder: Secondary | ICD-10-CM | POA: Diagnosis not present

## 2023-01-20 DIAGNOSIS — Z713 Dietary counseling and surveillance: Secondary | ICD-10-CM | POA: Diagnosis not present

## 2023-01-20 DIAGNOSIS — E7800 Pure hypercholesterolemia, unspecified: Secondary | ICD-10-CM | POA: Diagnosis not present

## 2023-01-20 DIAGNOSIS — Z7409 Other reduced mobility: Secondary | ICD-10-CM | POA: Diagnosis not present

## 2023-01-20 DIAGNOSIS — E119 Type 2 diabetes mellitus without complications: Secondary | ICD-10-CM | POA: Diagnosis not present

## 2023-01-20 DIAGNOSIS — M25562 Pain in left knee: Secondary | ICD-10-CM | POA: Diagnosis not present

## 2023-01-20 DIAGNOSIS — F329 Major depressive disorder, single episode, unspecified: Secondary | ICD-10-CM | POA: Diagnosis not present

## 2023-01-20 DIAGNOSIS — Z79899 Other long term (current) drug therapy: Secondary | ICD-10-CM | POA: Diagnosis not present

## 2023-01-20 DIAGNOSIS — M542 Cervicalgia: Secondary | ICD-10-CM | POA: Diagnosis not present

## 2023-01-20 DIAGNOSIS — M25561 Pain in right knee: Secondary | ICD-10-CM | POA: Diagnosis not present

## 2023-01-27 DIAGNOSIS — M542 Cervicalgia: Secondary | ICD-10-CM | POA: Diagnosis not present

## 2023-01-27 DIAGNOSIS — M25561 Pain in right knee: Secondary | ICD-10-CM | POA: Diagnosis not present

## 2023-01-27 DIAGNOSIS — Z7409 Other reduced mobility: Secondary | ICD-10-CM | POA: Diagnosis not present

## 2023-01-27 DIAGNOSIS — M25562 Pain in left knee: Secondary | ICD-10-CM | POA: Diagnosis not present

## 2023-02-03 DIAGNOSIS — Z713 Dietary counseling and surveillance: Secondary | ICD-10-CM | POA: Diagnosis not present

## 2023-02-06 DIAGNOSIS — F439 Reaction to severe stress, unspecified: Secondary | ICD-10-CM | POA: Diagnosis not present

## 2023-02-06 DIAGNOSIS — F410 Panic disorder [episodic paroxysmal anxiety] without agoraphobia: Secondary | ICD-10-CM | POA: Diagnosis not present

## 2023-02-10 DIAGNOSIS — M542 Cervicalgia: Secondary | ICD-10-CM | POA: Diagnosis not present

## 2023-02-10 DIAGNOSIS — M25561 Pain in right knee: Secondary | ICD-10-CM | POA: Diagnosis not present

## 2023-02-10 DIAGNOSIS — M25512 Pain in left shoulder: Secondary | ICD-10-CM | POA: Diagnosis not present

## 2023-02-10 DIAGNOSIS — Z7409 Other reduced mobility: Secondary | ICD-10-CM | POA: Diagnosis not present

## 2023-02-17 DIAGNOSIS — Z713 Dietary counseling and surveillance: Secondary | ICD-10-CM | POA: Diagnosis not present

## 2023-02-17 DIAGNOSIS — Z7409 Other reduced mobility: Secondary | ICD-10-CM | POA: Diagnosis not present

## 2023-02-17 DIAGNOSIS — M25512 Pain in left shoulder: Secondary | ICD-10-CM | POA: Diagnosis not present

## 2023-02-17 DIAGNOSIS — M25561 Pain in right knee: Secondary | ICD-10-CM | POA: Diagnosis not present

## 2023-02-17 DIAGNOSIS — M542 Cervicalgia: Secondary | ICD-10-CM | POA: Diagnosis not present

## 2023-02-24 DIAGNOSIS — Z7409 Other reduced mobility: Secondary | ICD-10-CM | POA: Diagnosis not present

## 2023-02-24 DIAGNOSIS — M25512 Pain in left shoulder: Secondary | ICD-10-CM | POA: Diagnosis not present

## 2023-02-24 DIAGNOSIS — M542 Cervicalgia: Secondary | ICD-10-CM | POA: Diagnosis not present

## 2023-02-24 DIAGNOSIS — M25561 Pain in right knee: Secondary | ICD-10-CM | POA: Diagnosis not present

## 2023-03-03 DIAGNOSIS — Z713 Dietary counseling and surveillance: Secondary | ICD-10-CM | POA: Diagnosis not present

## 2023-03-03 DIAGNOSIS — M25561 Pain in right knee: Secondary | ICD-10-CM | POA: Diagnosis not present

## 2023-03-03 DIAGNOSIS — M542 Cervicalgia: Secondary | ICD-10-CM | POA: Diagnosis not present

## 2023-03-03 DIAGNOSIS — Z7409 Other reduced mobility: Secondary | ICD-10-CM | POA: Diagnosis not present

## 2023-03-03 DIAGNOSIS — M25562 Pain in left knee: Secondary | ICD-10-CM | POA: Diagnosis not present

## 2023-03-10 DIAGNOSIS — F411 Generalized anxiety disorder: Secondary | ICD-10-CM | POA: Diagnosis not present

## 2023-03-24 DIAGNOSIS — Z713 Dietary counseling and surveillance: Secondary | ICD-10-CM | POA: Diagnosis not present

## 2023-03-24 DIAGNOSIS — E119 Type 2 diabetes mellitus without complications: Secondary | ICD-10-CM | POA: Diagnosis not present

## 2023-03-24 DIAGNOSIS — E7800 Pure hypercholesterolemia, unspecified: Secondary | ICD-10-CM | POA: Diagnosis not present

## 2023-03-24 DIAGNOSIS — Z79899 Other long term (current) drug therapy: Secondary | ICD-10-CM | POA: Diagnosis not present

## 2023-04-03 DIAGNOSIS — F439 Reaction to severe stress, unspecified: Secondary | ICD-10-CM | POA: Diagnosis not present

## 2023-04-03 DIAGNOSIS — F410 Panic disorder [episodic paroxysmal anxiety] without agoraphobia: Secondary | ICD-10-CM | POA: Diagnosis not present

## 2023-04-08 DIAGNOSIS — F329 Major depressive disorder, single episode, unspecified: Secondary | ICD-10-CM | POA: Diagnosis not present

## 2023-04-08 DIAGNOSIS — Z79899 Other long term (current) drug therapy: Secondary | ICD-10-CM | POA: Diagnosis not present

## 2023-04-21 DIAGNOSIS — Z713 Dietary counseling and surveillance: Secondary | ICD-10-CM | POA: Diagnosis not present

## 2023-04-25 DIAGNOSIS — Z79899 Other long term (current) drug therapy: Secondary | ICD-10-CM | POA: Diagnosis not present

## 2023-05-05 DIAGNOSIS — Z713 Dietary counseling and surveillance: Secondary | ICD-10-CM | POA: Diagnosis not present

## 2023-05-19 DIAGNOSIS — Z713 Dietary counseling and surveillance: Secondary | ICD-10-CM | POA: Diagnosis not present

## 2023-06-02 DIAGNOSIS — Z713 Dietary counseling and surveillance: Secondary | ICD-10-CM | POA: Diagnosis not present

## 2023-06-30 DIAGNOSIS — Z713 Dietary counseling and surveillance: Secondary | ICD-10-CM | POA: Diagnosis not present

## 2023-07-14 DIAGNOSIS — Z713 Dietary counseling and surveillance: Secondary | ICD-10-CM | POA: Diagnosis not present

## 2023-07-17 DIAGNOSIS — F410 Panic disorder [episodic paroxysmal anxiety] without agoraphobia: Secondary | ICD-10-CM | POA: Diagnosis not present

## 2023-07-17 DIAGNOSIS — F439 Reaction to severe stress, unspecified: Secondary | ICD-10-CM | POA: Diagnosis not present

## 2023-07-26 DIAGNOSIS — Z79899 Other long term (current) drug therapy: Secondary | ICD-10-CM | POA: Diagnosis not present

## 2023-07-26 DIAGNOSIS — I10 Essential (primary) hypertension: Secondary | ICD-10-CM | POA: Diagnosis not present

## 2023-07-26 DIAGNOSIS — M62838 Other muscle spasm: Secondary | ICD-10-CM | POA: Diagnosis not present

## 2023-07-26 DIAGNOSIS — M542 Cervicalgia: Secondary | ICD-10-CM | POA: Diagnosis not present

## 2023-07-26 DIAGNOSIS — Z7409 Other reduced mobility: Secondary | ICD-10-CM | POA: Diagnosis not present

## 2023-07-26 DIAGNOSIS — M25512 Pain in left shoulder: Secondary | ICD-10-CM | POA: Diagnosis not present

## 2023-07-26 DIAGNOSIS — M5030 Other cervical disc degeneration, unspecified cervical region: Secondary | ICD-10-CM | POA: Diagnosis not present

## 2023-07-28 DIAGNOSIS — Z713 Dietary counseling and surveillance: Secondary | ICD-10-CM | POA: Diagnosis not present

## 2023-08-05 DIAGNOSIS — Z0000 Encounter for general adult medical examination without abnormal findings: Secondary | ICD-10-CM | POA: Diagnosis not present

## 2023-08-05 DIAGNOSIS — Z9884 Bariatric surgery status: Secondary | ICD-10-CM | POA: Diagnosis not present

## 2023-08-05 DIAGNOSIS — Z79899 Other long term (current) drug therapy: Secondary | ICD-10-CM | POA: Diagnosis not present

## 2023-08-05 DIAGNOSIS — E119 Type 2 diabetes mellitus without complications: Secondary | ICD-10-CM | POA: Diagnosis not present

## 2023-08-05 DIAGNOSIS — E559 Vitamin D deficiency, unspecified: Secondary | ICD-10-CM | POA: Diagnosis not present

## 2023-08-05 DIAGNOSIS — E7800 Pure hypercholesterolemia, unspecified: Secondary | ICD-10-CM | POA: Diagnosis not present

## 2023-08-18 DIAGNOSIS — Z713 Dietary counseling and surveillance: Secondary | ICD-10-CM | POA: Diagnosis not present

## 2023-09-01 DIAGNOSIS — Z713 Dietary counseling and surveillance: Secondary | ICD-10-CM | POA: Diagnosis not present

## 2023-09-02 DIAGNOSIS — H40003 Preglaucoma, unspecified, bilateral: Secondary | ICD-10-CM | POA: Diagnosis not present

## 2023-09-15 DIAGNOSIS — Z713 Dietary counseling and surveillance: Secondary | ICD-10-CM | POA: Diagnosis not present

## 2023-10-06 DIAGNOSIS — Z713 Dietary counseling and surveillance: Secondary | ICD-10-CM | POA: Diagnosis not present

## 2023-10-20 DIAGNOSIS — Z713 Dietary counseling and surveillance: Secondary | ICD-10-CM | POA: Diagnosis not present

## 2023-10-22 DIAGNOSIS — F439 Reaction to severe stress, unspecified: Secondary | ICD-10-CM | POA: Diagnosis not present

## 2023-10-22 DIAGNOSIS — F410 Panic disorder [episodic paroxysmal anxiety] without agoraphobia: Secondary | ICD-10-CM | POA: Diagnosis not present

## 2023-11-03 DIAGNOSIS — Z713 Dietary counseling and surveillance: Secondary | ICD-10-CM | POA: Diagnosis not present

## 2023-11-17 DIAGNOSIS — Z713 Dietary counseling and surveillance: Secondary | ICD-10-CM | POA: Diagnosis not present

## 2023-12-11 DIAGNOSIS — F410 Panic disorder [episodic paroxysmal anxiety] without agoraphobia: Secondary | ICD-10-CM | POA: Diagnosis not present

## 2023-12-11 DIAGNOSIS — F439 Reaction to severe stress, unspecified: Secondary | ICD-10-CM | POA: Diagnosis not present

## 2023-12-22 DIAGNOSIS — Z713 Dietary counseling and surveillance: Secondary | ICD-10-CM | POA: Diagnosis not present

## 2024-01-12 DIAGNOSIS — Z713 Dietary counseling and surveillance: Secondary | ICD-10-CM | POA: Diagnosis not present

## 2024-02-02 DIAGNOSIS — Z713 Dietary counseling and surveillance: Secondary | ICD-10-CM | POA: Diagnosis not present

## 2024-02-06 DIAGNOSIS — E7800 Pure hypercholesterolemia, unspecified: Secondary | ICD-10-CM | POA: Diagnosis not present

## 2024-02-06 DIAGNOSIS — E119 Type 2 diabetes mellitus without complications: Secondary | ICD-10-CM | POA: Diagnosis not present

## 2024-02-06 DIAGNOSIS — I10 Essential (primary) hypertension: Secondary | ICD-10-CM | POA: Diagnosis not present
